# Patient Record
Sex: Female | Born: 1990 | Hispanic: No | Marital: Married | Smoking: Never smoker
Health system: Southern US, Community
[De-identification: ages and names within clinical notes are randomized; demographics above are authoritative.]

## PROBLEM LIST (undated history)

## (undated) DIAGNOSIS — D649 Anemia, unspecified: Secondary | ICD-10-CM

## (undated) DIAGNOSIS — O24419 Gestational diabetes mellitus in pregnancy, unspecified control: Secondary | ICD-10-CM

## (undated) HISTORY — DX: Gestational diabetes mellitus in pregnancy, unspecified control: O24.419

## (undated) HISTORY — PX: NO PAST SURGERIES: SHX2092

---

## 2015-10-08 NOTE — L&D Delivery Note (Signed)
25 y.o. G1P0 at 2672w2d delivered a viable female infant in cephalic, LOA position. No nuchal cord. Right anterior shoulder delivered with ease. 60 sec delayed cord clamping. Cord clamped x2 and cut. Placenta delivered spontaneously intact, with 3VC. Fundus firm on exam with massage and pitocin. Good hemostasis noted.  Laceration: 2nd degree perineal with extension into right labia Suture: 3-0 Vicryl Good hemostasis noted.  Mom and baby recovering in LDR.    Apgars: 8/9 Weight: pending, skin to skin EBL: 250 cc    Anne MowElizabeth Rease Wence, DO OB Fellow [facility] 07/12/2016, 9:29 PM

## 2016-04-12 ENCOUNTER — Encounter: Payer: Self-pay | Admitting: Obstetrics and Gynecology

## 2016-04-12 ENCOUNTER — Other Ambulatory Visit: Payer: Medicaid Other

## 2016-04-12 ENCOUNTER — Other Ambulatory Visit: Payer: Self-pay | Admitting: Obstetrics and Gynecology

## 2016-04-12 ENCOUNTER — Ambulatory Visit: Payer: Medicaid Other | Admitting: Obstetrics and Gynecology

## 2016-04-12 VITALS — BP 101/70 | Wt 128.0 lb

## 2016-04-12 DIAGNOSIS — N912 Amenorrhea, unspecified: Secondary | ICD-10-CM

## 2016-04-12 MED ORDER — PRENATAL VITAMINS 0.8 MG PO TABS
1.0000 | ORAL_TABLET | Freq: Every day | ORAL | Status: AC
Start: 2016-04-12 — End: ?

## 2016-04-12 NOTE — Progress Notes (Signed)
Subjective:       Patient ID: Vanessa Maxwell is a 25 y.o. female.Present with amenorrhea 26 weeks by LMP    HPI    The following portions of the patient's history were reviewed and updated as appropriate: allergies, current medications, past family history, past social history, past surgical history and problem list.    Review of Systems   Constitutional: Negative.    HENT: Negative.    Eyes: Negative.    Cardiovascular: Negative.    Endocrine: Negative.    Genitourinary: Negative.    Musculoskeletal: Negative.    Skin: Negative.    Allergic/Immunologic: Negative.    Neurological: Negative.            Objective:    Physical Exam   Constitutional: She is oriented to person, place, and time. She appears well-developed and well-nourished.   HENT:   Head: Normocephalic and atraumatic.   Eyes: Conjunctivae and EOM are normal. Pupils are equal, round, and reactive to light.   Neck: Neck supple. No thyromegaly present.   Cardiovascular: Normal rate and regular rhythm.    Pulmonary/Chest: Effort normal and breath sounds normal.   Abdominal: Soft. Bowel sounds are normal. She exhibits no distension.   Genitourinary: Vagina normal and uterus normal.   Musculoskeletal: Normal range of motion.   Neurological: She is alert and oriented to person, place, and time.   Skin: Skin is warm.           Assessment:     Ultrasound ordered   Awaiting address          Plan:      Procedures    Ultrasound results showed   AUA [redacted]W[redacted]D  EDD1 0/1/17  EFW 2LB 8ZO 75%Tile  Vertex Presentation    Anterior placenta grade 0  CX 4.1CM  Baby Girl  Anatomical survey next week  F/U in one week

## 2016-04-16 ENCOUNTER — Other Ambulatory Visit: Payer: Medicaid Other

## 2016-04-16 ENCOUNTER — Ambulatory Visit: Payer: Medicaid Other | Admitting: Obstetrics and Gynecology

## 2016-04-17 LAB — CHLAMYDIA TRACHOMATIS AND NEISSERIA GONORRHOEAE, RNA, TMA
C.trachomatis RNA,TMA: NOT DETECTED
N. gonorrhoeae RNA, TMA: NOT DETECTED

## 2016-04-18 LAB — CBC WITH DIFF AND PLT, RPR, ABO & RH,  ANTIBODY SCREEN,  RUBELLA AND HEPATITIS B SURFACE ANTIGEN
AB Screen: NOT DETECTED
Atypical Lymphocytes %: 0 %
Baso(Absolute): 15 cells/uL (ref 0–200)
Basophils: 0.2 %
Eosinophils Absolute: 38 cells/uL (ref 15–500)
Eosinophils: 0.5 %
Hematocrit: 36.8 % (ref 35.0–45.0)
Hemoglobin: 11.8 g/dL (ref 11.7–15.5)
Hepatitis B Surface Antigen: NONREACTIVE
Lymphocytes Absolute: 1103 cells/uL (ref 850–3900)
Lymphocytes: 14.7 %
MCH: 30.4 pg (ref 27–33)
MCHC: 32 g/dL (ref 32–36)
MCV: 95 fL (ref 80–100)
MPV: 12 fL (ref 7.5–12.5)
Monocytes Absolute: 390 cells/uL (ref 200–950)
Monocytes: 5.2 %
Neutrophils Absolute: 5955 cells/uL (ref 1500–7800)
Neutrophils: 79.4 %
Platelets: 153 10*3/uL (ref 140–400)
RBC: 3.88 10*6/uL (ref 3.80–5.10)
RDW: 14.4 % (ref 11.0–15.0)
RPR: NONREACTIVE
Rubella AB, IgG: 17.5 Index
WBC: 7.5 10*3/uL (ref 3.8–10.8)

## 2016-04-18 LAB — TSH: TSH: 2.02 mIU/L (ref 0.40–4.50)

## 2016-04-18 LAB — THINPREP IMAGING PAP WITH REFLEX TO HPV MRNA E6/E7.

## 2016-04-18 LAB — HIV-1/2 AG/AB 4TH GEN. W/ REFLEX: HIV Ag/Ab, 4th Generation: NONREACTIVE

## 2016-04-25 ENCOUNTER — Encounter: Payer: Medicaid Other | Admitting: Obstetrics and Gynecology

## 2016-04-29 ENCOUNTER — Encounter: Payer: Self-pay | Admitting: Obstetrics and Gynecology

## 2016-04-29 ENCOUNTER — Ambulatory Visit: Payer: Medicaid Other | Admitting: Obstetrics and Gynecology

## 2016-04-29 DIAGNOSIS — R109 Unspecified abdominal pain: Secondary | ICD-10-CM

## 2016-04-29 LAB — CYTOLOGY - PAP: Pap: NEGATIVE

## 2016-04-29 NOTE — Progress Notes (Signed)
IUP at [redacted]w[redacted]d. Size = Dates.  No complications. Patient states she has acid reflux. Sugar test completed. Results to follow. F/U in 1 weeks.

## 2016-04-30 LAB — CBC AND DIFFERENTIAL
Atypical Lymphocytes %: 0 %
Baso(Absolute): 20 cells/uL (ref 0–200)
Basophils: 0.2 %
Eosinophils Absolute: 91 cells/uL (ref 15–500)
Eosinophils: 0.9 %
Hematocrit: 33.3 % — ABNORMAL LOW (ref 35.0–45.0)
Hemoglobin: 10.6 g/dL — ABNORMAL LOW (ref 11.7–15.5)
Lymphocytes Absolute: 1313 cells/uL (ref 850–3900)
Lymphocytes: 13 %
MCH: 30.2 pg (ref 27–33)
MCHC: 31.7 g/dL — ABNORMAL LOW (ref 32–36)
MCV: 95 fL (ref 80–100)
MPV: 10.8 fL (ref 7.5–12.5)
Monocytes Absolute: 556 cells/uL (ref 200–950)
Monocytes: 5.5 %
Neutrophils Absolute: 8120 cells/uL — ABNORMAL HIGH (ref 1500–7800)
Neutrophils: 80.4 %
Platelets: 149 10*3/uL (ref 140–400)
RBC: 3.5 10*6/uL — ABNORMAL LOW (ref 3.80–5.10)
RDW: 13.5 % (ref 11.0–15.0)
WBC: 10.1 10*3/uL (ref 3.8–10.8)

## 2016-04-30 LAB — GLUCOSE GESTATIONAL SCREEN,  1 HOUR ONLY, (50G) 140 CUTF: GLUCOSE,GEST SCREEN-140 CUTOFF: 98 mg/dL (ref <140)

## 2016-05-03 ENCOUNTER — Telehealth: Payer: Self-pay

## 2016-05-17 ENCOUNTER — Other Ambulatory Visit: Payer: Medicaid Other

## 2016-05-17 ENCOUNTER — Ambulatory Visit: Payer: Medicaid Other | Admitting: Obstetrics and Gynecology

## 2016-05-17 VITALS — BP 104/60 | Wt 130.0 lb

## 2016-05-17 DIAGNOSIS — Z3A32 32 weeks gestation of pregnancy: Secondary | ICD-10-CM

## 2016-05-17 NOTE — Progress Notes (Incomplete)
IUP at [redacted]w[redacted]d. Size >> Dates. No complications. Patient states she has acid reflux on last visit. Sugar test completed on last visit as well.  Patient c/o some headaches.  Education given regarding headaches in pregnancy.  Instructions given if headaches get worst or increase in intensity to f/u.  USG done.  Results on chart. F/U in 1 week.

## 2016-05-31 ENCOUNTER — Other Ambulatory Visit: Payer: Medicaid Other

## 2016-05-31 ENCOUNTER — Ambulatory Visit: Payer: Medicaid Other | Admitting: Obstetrics and Gynecology

## 2016-05-31 VITALS — BP 115/78 | Wt 135.0 lb

## 2016-05-31 DIAGNOSIS — Z3A34 34 weeks gestation of pregnancy: Secondary | ICD-10-CM | POA: Insufficient documentation

## 2016-05-31 NOTE — Progress Notes (Signed)
IUP at [redacted]w[redacted]d. Size < Dates. No complications. Sugar test completed on last visit as well.  USG done. Results on chart. F/U in 1 week.   GBS done   Patient [place] another [place] requesting medical records.  Will work closely with patient is Arts development officer transfer of notes.

## 2016-06-03 LAB — GROUP B STREP CULTURE

## 2016-06-04 LAB — CBC AND DIFFERENTIAL
Atypical Lymphocytes %: 0 %
Baso(Absolute): 19 cells/uL (ref 0–200)
Basophils: 0.2 %
Eosinophils Absolute: 58 cells/uL (ref 15–500)
Eosinophils: 0.6 %
Hematocrit: 33.3 % — ABNORMAL LOW (ref 35.0–45.0)
Hemoglobin: 10.9 g/dL — ABNORMAL LOW (ref 11.7–15.5)
Lymphocytes Absolute: 1344 cells/uL (ref 850–3900)
Lymphocytes: 14 %
MCH: 31 pg (ref 27–33)
MCHC: 32.8 g/dL (ref 32–36)
MCV: 95 fL (ref 80–100)
MPV: 10.8 fL (ref 7.5–12.5)
Monocytes Absolute: 595 cells/uL (ref 200–950)
Monocytes: 6.2 %
Neutrophils Absolute: 7584 cells/uL (ref 1500–7800)
Neutrophils: 79 %
Platelets: 123 10*3/uL — ABNORMAL LOW (ref 140–400)
RBC: 3.51 10*6/uL — ABNORMAL LOW (ref 3.80–5.10)
RDW: 14.9 % (ref 11.0–15.0)
WBC: 9.6 10*3/uL (ref 3.8–10.8)

## 2016-06-04 LAB — RPR: RPR: NONREACTIVE

## 2016-07-06 ENCOUNTER — Inpatient Hospital Stay (HOSPITAL_COMMUNITY)
Admission: AD | Admit: 2016-07-06 | Discharge: 2016-07-06 | Disposition: A | Discharge status: Home / Self Care | Payer: Medicaid Other | Source: Ambulatory Visit | Attending: Family Medicine | Admitting: Family Medicine

## 2016-07-06 ENCOUNTER — Encounter (HOSPITAL_COMMUNITY): Payer: Self-pay | Admitting: *Deleted

## 2016-07-06 DIAGNOSIS — Z3403 Encounter for supervision of normal first pregnancy, third trimester: Secondary | ICD-10-CM | POA: Insufficient documentation

## 2016-07-06 DIAGNOSIS — O0930 Supervision of pregnancy with insufficient antenatal care, unspecified trimester: Secondary | ICD-10-CM

## 2016-07-06 HISTORY — DX: Anemia, unspecified: D64.9

## 2016-07-06 LAB — CBC
HCT: 38 % (ref 36.0–46.0)
Hemoglobin: 12.6 g/dL (ref 12.0–15.0)
MCH: 31.7 pg (ref 26.0–34.0)
MCHC: 33.2 g/dL (ref 30.0–36.0)
MCV: 95.5 fL (ref 78.0–100.0)
PLATELETS: 121 10*3/uL — AB (ref 150–400)
RBC: 3.98 MIL/uL (ref 3.87–5.11)
RDW: 13.9 % (ref 11.5–15.5)
WBC: 9.1 10*3/uL (ref 4.0–10.5)

## 2016-07-06 LAB — DIFFERENTIAL
BASOS ABS: 0 10*3/uL (ref 0.0–0.1)
BASOS PCT: 0 %
Eosinophils Absolute: 0 10*3/uL (ref 0.0–0.7)
Eosinophils Relative: 0 %
LYMPHS PCT: 20 %
Lymphs Abs: 1.8 10*3/uL (ref 0.7–4.0)
Monocytes Absolute: 0.4 10*3/uL (ref 0.1–1.0)
Monocytes Relative: 4 %
NEUTROS ABS: 6.8 10*3/uL (ref 1.7–7.7)
NEUTROS PCT: 75 %

## 2016-07-06 LAB — OB RESULTS CONSOLE GBS: STREP GROUP B AG: NEGATIVE

## 2016-07-06 LAB — GROUP B STREP BY PCR: GROUP B STREP BY PCR: NEGATIVE

## 2016-07-06 NOTE — Discharge Instructions (Signed)
Fetal Movement Counts °Patient Name: __________________________________________________ Patient Due Date: ____________________ °Performing a fetal movement count is highly recommended in high-risk pregnancies, but it is good for every pregnant woman to do. Your health care provider may ask you to start counting fetal movements at 28 weeks of the pregnancy. Fetal movements often increase: °· After eating a full meal. °· After physical activity. °· After eating or drinking something sweet or cold. °· At rest. °Pay attention to when you feel the baby is most active. This will help you notice a pattern of your baby's sleep and wake cycles and what factors contribute to an increase in fetal movement. It is important to perform a fetal movement count at the same time each day when your baby is normally most active.  °HOW TO COUNT FETAL MOVEMENTS °· Find a quiet and comfortable area to sit or lie down on your left side. Lying on your left side provides the best blood and oxygen circulation to your baby. °· Write down the day and time on a sheet of paper or in a journal. °· Start counting kicks, flutters, swishes, rolls, or jabs in a 2-hour period. You should feel at least 10 movements within 2 hours. °· If you do not feel 10 movements in 2 hours, wait 2-3 hours and count again. Look for a change in the pattern or not enough counts in 2 hours. °SEEK MEDICAL CARE IF: °· You feel less than 10 counts in 2 hours, tried twice. °· There is no movement in over an hour. °· The pattern is changing or taking longer each day to reach 10 counts in 2 hours. °· You feel the baby is not moving as he or she usually does. °Date: ____________ Movements: ____________ Start time: ____________ Finish time: ____________  °Date: ____________ Movements: ____________ Start time: ____________ Finish time: ____________ °Date: ____________ Movements: ____________ Start time: ____________ Finish time: ____________ °Date: ____________ Movements:  ____________ Start time: ____________ Finish time: ____________ °Date: ____________ Movements: ____________ Start time: ____________ Finish time: ____________ °Date: ____________ Movements: ____________ Start time: ____________ Finish time: ____________ °Date: ____________ Movements: ____________ Start time: ____________ Finish time: ____________ °Date: ____________ Movements: ____________ Start time: ____________ Finish time: ____________  °Date: ____________ Movements: ____________ Start time: ____________ Finish time: ____________ °Date: ____________ Movements: ____________ Start time: ____________ Finish time: ____________ °Date: ____________ Movements: ____________ Start time: ____________ Finish time: ____________ °Date: ____________ Movements: ____________ Start time: ____________ Finish time: ____________ °Date: ____________ Movements: ____________ Start time: ____________ Finish time: ____________ °Date: ____________ Movements: ____________ Start time: ____________ Finish time: ____________ °Date: ____________ Movements: ____________ Start time: ____________ Finish time: ____________  °Date: ____________ Movements: ____________ Start time: ____________ Finish time: ____________ °Date: ____________ Movements: ____________ Start time: ____________ Finish time: ____________ °Date: ____________ Movements: ____________ Start time: ____________ Finish time: ____________ °Date: ____________ Movements: ____________ Start time: ____________ Finish time: ____________ °Date: ____________ Movements: ____________ Start time: ____________ Finish time: ____________ °Date: ____________ Movements: ____________ Start time: ____________ Finish time: ____________ °Date: ____________ Movements: ____________ Start time: ____________ Finish time: ____________  °Date: ____________ Movements: ____________ Start time: ____________ Finish time: ____________ °Date: ____________ Movements: ____________ Start time: ____________ Finish  time: ____________ °Date: ____________ Movements: ____________ Start time: ____________ Finish time: ____________ °Date: ____________ Movements: ____________ Start time: ____________ Finish time: ____________ °Date: ____________ Movements: ____________ Start time: ____________ Finish time: ____________ °Date: ____________ Movements: ____________ Start time: ____________ Finish time: ____________ °Date: ____________ Movements: ____________ Start time: ____________ Finish time: ____________  °Date: ____________ Movements: ____________ Start time: ____________ Finish   time: ____________ °Date: ____________ Movements: ____________ Start time: ____________ Finish time: ____________ °Date: ____________ Movements: ____________ Start time: ____________ Finish time: ____________ °Date: ____________ Movements: ____________ Start time: ____________ Finish time: ____________ °Date: ____________ Movements: ____________ Start time: ____________ Finish time: ____________ °Date: ____________ Movements: ____________ Start time: ____________ Finish time: ____________ °Date: ____________ Movements: ____________ Start time: ____________ Finish time: ____________  °Date: ____________ Movements: ____________ Start time: ____________ Finish time: ____________ °Date: ____________ Movements: ____________ Start time: ____________ Finish time: ____________ °Date: ____________ Movements: ____________ Start time: ____________ Finish time: ____________ °Date: ____________ Movements: ____________ Start time: ____________ Finish time: ____________ °Date: ____________ Movements: ____________ Start time: ____________ Finish time: ____________ °Date: ____________ Movements: ____________ Start time: ____________ Finish time: ____________ °Date: ____________ Movements: ____________ Start time: ____________ Finish time: ____________  °Date: ____________ Movements: ____________ Start time: ____________ Finish time: ____________ °Date: ____________  Movements: ____________ Start time: ____________ Finish time: ____________ °Date: ____________ Movements: ____________ Start time: ____________ Finish time: ____________ °Date: ____________ Movements: ____________ Start time: ____________ Finish time: ____________ °Date: ____________ Movements: ____________ Start time: ____________ Finish time: ____________ °Date: ____________ Movements: ____________ Start time: ____________ Finish time: ____________ °Date: ____________ Movements: ____________ Start time: ____________ Finish time: ____________  °Date: ____________ Movements: ____________ Start time: ____________ Finish time: ____________ °Date: ____________ Movements: ____________ Start time: ____________ Finish time: ____________ °Date: ____________ Movements: ____________ Start time: ____________ Finish time: ____________ °Date: ____________ Movements: ____________ Start time: ____________ Finish time: ____________ °Date: ____________ Movements: ____________ Start time: ____________ Finish time: ____________ °Date: ____________ Movements: ____________ Start time: ____________ Finish time: ____________ °  °This information is not intended to replace advice given to you by your health care provider. Make sure you discuss any questions you have with your health care provider. °  °Document Released: 10/23/2006 Document Revised: 10/14/2014 Document Reviewed: 07/20/2012 °Elsevier Interactive Patient Education ©2016 [employer] °Braxton Hicks Contractions °Contractions of the uterus can occur throughout pregnancy. Contractions are not always a sign that you are in labor.  °WHAT ARE BRAXTON HICKS CONTRACTIONS?  °Contractions that occur before labor are called Braxton Hicks contractions, or false labor. Toward the end of pregnancy (32-34 weeks), these contractions can develop more often and may become more forceful. This is not true labor because these contractions do not result in opening (dilatation) and thinning of  the cervix. They are sometimes difficult to tell apart from true labor because these contractions can be forceful and people have different pain tolerances. You should not feel embarrassed if you go to the hospital with false labor. Sometimes, the only way to tell if you are in true labor is for your health care provider to look for changes in the cervix. °If there are no prenatal problems or other health problems associated with the pregnancy, it is completely safe to be sent home with false labor and await the onset of true labor. °HOW CAN YOU TELL THE DIFFERENCE BETWEEN TRUE AND FALSE LABOR? °False Labor °· The contractions of false labor are usually shorter and not as hard as those of true labor.   °· The contractions are usually irregular.   °· The contractions are often felt in the front of the lower abdomen and in the groin.   °· The contractions may go away when you walk around or change positions while lying down.   °· The contractions get weaker and are shorter lasting as time goes on.   °· The contractions do not usually become progressively stronger, regular, and closer together as with true labor.   °True Labor °· Contractions in true   labor last 30-70 seconds, become very regular, usually become more intense, and increase in frequency.   °· The contractions do not go away with walking.   °· The discomfort is usually felt in the top of the uterus and spreads to the lower abdomen and low back.   °· True labor can be determined by your health care provider with an exam. This will show that the cervix is dilating and getting thinner.   °WHAT TO REMEMBER °· Keep up with your usual exercises and follow other instructions given by your health care provider.   °· Take medicines as directed by your health care provider.   °· Keep your regular prenatal appointments.   °· Eat and drink lightly if you think you are going into labor.   °· If Braxton Hicks contractions are making you uncomfortable:   °¨ Change your  position from lying down or resting to walking, or from walking to resting.   °¨ Sit and rest in a tub of warm water.   °¨ Drink 2-3 glasses of water. Dehydration may cause these contractions.   °¨ Do slow and deep breathing several times an hour.   °WHEN SHOULD I SEEK IMMEDIATE MEDICAL CARE? °Seek immediate medical care if: °· Your contractions become stronger, more regular, and closer together.   °· You have fluid leaking or gushing from your vagina.   °· You have a fever.   °· You pass blood-tinged mucus.   °· You have vaginal bleeding.   °· You have continuous abdominal pain.   °· You have low back pain that you never had before.   °· You feel your baby's head pushing down and causing pelvic pressure.   °· Your baby is not moving as much as it used to.   °  °This information is not intended to replace advice given to you by your health care provider. Make sure you discuss any questions you have with your health care provider. °  °Document Released: 09/23/2005 Document Revised: 09/28/2013 Document Reviewed: 07/05/2013 °Elsevier Interactive Patient Education ©2016 Elsevier Inc. ° °

## 2016-07-06 NOTE — MAU Note (Addendum)
Pt here for small amount of brown discharge and some contractions that started at 5pm. +FM. Denies leaking of fluid. [place] Has not seen Dr since th

## 2016-07-07 LAB — HEPATITIS B SURFACE ANTIGEN: HEP B S AG: NEGATIVE

## 2016-07-07 LAB — RPR: RPR: NONREACTIVE

## 2016-07-07 LAB — ABO/RH: ABO/RH(D): B POS

## 2016-07-07 LAB — HIV ANTIBODY (ROUTINE TESTING W REFLEX): HIV Screen 4th Generation wRfx: NONREACTIVE

## 2016-07-08 ENCOUNTER — Encounter (HOSPITAL_COMMUNITY): Payer: Self-pay | Admitting: *Deleted

## 2016-07-08 ENCOUNTER — Inpatient Hospital Stay (HOSPITAL_COMMUNITY)
Admission: AD | Admit: 2016-07-08 | Discharge: 2016-07-08 | Disposition: A | Discharge status: Home / Self Care | Payer: Medicaid Other | Source: Ambulatory Visit | Attending: Obstetrics and Gynecology | Admitting: Obstetrics and Gynecology

## 2016-07-08 DIAGNOSIS — Z3A38 38 weeks gestation of pregnancy: Secondary | ICD-10-CM | POA: Diagnosis not present

## 2016-07-08 DIAGNOSIS — M79659 Pain in unspecified thigh: Secondary | ICD-10-CM | POA: Insufficient documentation

## 2016-07-08 DIAGNOSIS — O26893 Other specified pregnancy related conditions, third trimester: Secondary | ICD-10-CM | POA: Diagnosis not present

## 2016-07-08 DIAGNOSIS — R1030 Lower abdominal pain, unspecified: Secondary | ICD-10-CM | POA: Diagnosis not present

## 2016-07-08 DIAGNOSIS — M79606 Pain in leg, unspecified: Secondary | ICD-10-CM | POA: Diagnosis present

## 2016-07-08 DIAGNOSIS — R102 Pelvic and perineal pain: Secondary | ICD-10-CM

## 2016-07-08 LAB — URINALYSIS, ROUTINE W REFLEX MICROSCOPIC
Bilirubin Urine: NEGATIVE
GLUCOSE, UA: NEGATIVE mg/dL
HGB URINE DIPSTICK: NEGATIVE
Ketones, ur: NEGATIVE mg/dL
Leukocytes, UA: NEGATIVE
Nitrite: NEGATIVE
PH: 6 (ref 5.0–8.0)
PROTEIN: NEGATIVE mg/dL
Specific Gravity, Urine: <1.005 — ABNORMAL LOW (ref 1.005–1.030)

## 2016-07-08 LAB — RUBELLA SCREEN: RUBELLA: 15.1 {index} (ref >0.99)

## 2016-07-08 MED ORDER — CYCLOBENZAPRINE HCL 5 MG PO TABS: 5.0000 mg | ORAL_TABLET | Freq: Three times a day (TID) | ORAL | 0 refills | Status: DC | PRN

## 2016-07-08 NOTE — MAU Provider Note (Signed)
  History     CSN: [id number]  Arrival date and time: 07/08/16 1905   None     No chief complaint on file.  HPI  [name] is a 25 year old G1P0 at 311w5d who presents to the MAU with leg pain that started this morning. The pain is located in her groins and upper legs. She states she can't walk normally. The pain is worse with walking and closing her legs. The pain is better with laying down with her legs spread. She has not had pain in her legs before, but has had lower abdominal pain earlier in this pregnancy. The pain feels like "grabbing" pain. She started having irregular contractions a few weeks ago. The contractions are occurring every couple of hours. She denies any vaginal bleeding. She endorses white, thick, "mucousy" discharge. She denies dysuria. No dizziness.   Past Medical History:  Diagnosis Date  . Anemia     Past Surgical History:  Procedure Laterality Date  . NO PAST SURGERIES      History reviewed. No pertinent family history.  Social History  Substance Use Topics  . Smoking status: Never Smoker  . Smokeless tobacco: Never Used  . Alcohol use No    Allergies: No Known Allergies  Prescriptions Prior to Admission  Medication Sig Dispense Refill Last Dose  . prenatal vitamin w/FE, FA (PRENATAL 1 + 1) 27-1 MG TABS tablet Take 1 tablet by mouth daily at 12 noon.   07/08/2016 at Unknown time    Review of Systems  Eyes: Negative for blurred vision.  Respiratory: Negative for shortness of breath.   Cardiovascular: Negative for chest pain.  Gastrointestinal: Positive for abdominal pain. Negative for heartburn, nausea and vomiting.  Genitourinary: Negative for dysuria.  Neurological: Positive for headaches. Negative for dizziness and weakness.   Physical Exam   Blood pressure 120/81, pulse 91, temperature 98.5 F (36.9 C), temperature source Oral, resp. rate 16, height 5\' 3"  (1.6 m), weight 61.2 kg (135 lb), SpO2 100 %.  Physical Exam  Nursing note and vitals  reviewed. Constitutional: She is oriented to person, place, and time. She appears well-developed and well-nourished. No distress.  Eyes: No scleral icterus.  Neck: Normal range of motion.  Cardiovascular: Normal rate.   Respiratory: Effort normal.  GI: Soft. There is no tenderness.  gravid  Musculoskeletal: Normal range of motion. She exhibits no edema or deformity.  Neurological: She is alert and oriented to person, place, and time.  Skin: Skin is warm and dry. No rash noted.  Brown dye present on fingertips and toes    MAU Course  Procedures  Fetal heart monitoring- normal  Assessment and Plan  Pt is a 25 year old G1P0 at 301w5d presenting with lower abdominal pain and groin/thigh pain. She has been having to walk with her legs slightly spread. We watched her ambulate in the halls of the MAU and she was smiling while walking. Likely muscular/ligamentous pain of advanced pregnancy. - Advised Pt that she can use Tylenol around the clock - Flexeril 5mg  tid prn prescribed - We are trying to get her into [facility] for routine prenatal care. She has not received regular care this pregnancy. - She will follow-up in our clinic when we can get her scheduled  Hilton SinclairKaty D Mayo 07/08/2016, 8:43 PM   OB FELLOW DISCHARGE ATTESTATION  I have seen and examined this patient and agree with above documentation in the resident's note.   Ernestina PennaNicholas Dagoberto Nealy, MD 10:14 PM

## 2016-07-08 NOTE — Discharge Instructions (Signed)

## 2016-07-08 NOTE — MAU Note (Signed)
Pt reports pain in her pelvic/groin area, states she cannot walk. Denies bleeding or ROM.

## 2016-07-11 ENCOUNTER — Encounter (HOSPITAL_COMMUNITY): Payer: Self-pay | Admitting: *Deleted

## 2016-07-11 ENCOUNTER — Inpatient Hospital Stay (HOSPITAL_COMMUNITY): Payer: Medicaid Other

## 2016-07-11 ENCOUNTER — Inpatient Hospital Stay (HOSPITAL_COMMUNITY)
Admission: AD | Admit: 2016-07-11 | Discharge: 2016-07-14 | DRG: 774 | Disposition: A | Discharge status: Home / Self Care | Payer: Medicaid Other | Source: Ambulatory Visit | Attending: Obstetrics and Gynecology | Admitting: Obstetrics and Gynecology

## 2016-07-11 ENCOUNTER — Inpatient Hospital Stay (HOSPITAL_COMMUNITY): Payer: Medicaid Other | Admitting: Anesthesiology

## 2016-07-11 ENCOUNTER — Inpatient Hospital Stay (HOSPITAL_COMMUNITY)
Admission: AD | Admit: 2016-07-11 | Discharge: 2016-07-11 | Disposition: A | Discharge status: Home / Self Care | Payer: Medicaid Other | Source: Ambulatory Visit | Attending: Family Medicine | Admitting: Family Medicine

## 2016-07-11 DIAGNOSIS — N939 Abnormal uterine and vaginal bleeding, unspecified: Secondary | ICD-10-CM | POA: Diagnosis present

## 2016-07-11 DIAGNOSIS — O4593 Premature separation of placenta, unspecified, third trimester: Secondary | ICD-10-CM | POA: Diagnosis present

## 2016-07-11 DIAGNOSIS — Z3A39 39 weeks gestation of pregnancy: Secondary | ICD-10-CM | POA: Insufficient documentation

## 2016-07-11 DIAGNOSIS — R58 Hemorrhage, not elsewhere classified: Secondary | ICD-10-CM

## 2016-07-11 DIAGNOSIS — O4693 Antepartum hemorrhage, unspecified, third trimester: Secondary | ICD-10-CM | POA: Insufficient documentation

## 2016-07-11 DIAGNOSIS — Z3403 Encounter for supervision of normal first pregnancy, third trimester: Secondary | ICD-10-CM | POA: Diagnosis present

## 2016-07-11 LAB — CBC
HEMATOCRIT: 37.8 % (ref 36.0–46.0)
HEMOGLOBIN: 13 g/dL (ref 12.0–15.0)
MCH: 32.3 pg (ref 26.0–34.0)
MCHC: 34.4 g/dL (ref 30.0–36.0)
MCV: 93.8 fL (ref 78.0–100.0)
Platelets: 118 10*3/uL — ABNORMAL LOW (ref 150–400)
RBC: 4.03 MIL/uL (ref 3.87–5.11)
RDW: 13.8 % (ref 11.5–15.5)
WBC: 10.7 10*3/uL — AB (ref 4.0–10.5)

## 2016-07-11 LAB — TYPE AND SCREEN
ABO/RH(D): B POS
Antibody Screen: NEGATIVE

## 2016-07-11 IMAGING — US US MFM OB LIMITED
1 series · 15 of 28 positions shown · non-contrast
Comparison: none

[Series 1: us mfm ob limited · 36 acquisitions, 15 frames shown]
[im 1/36]
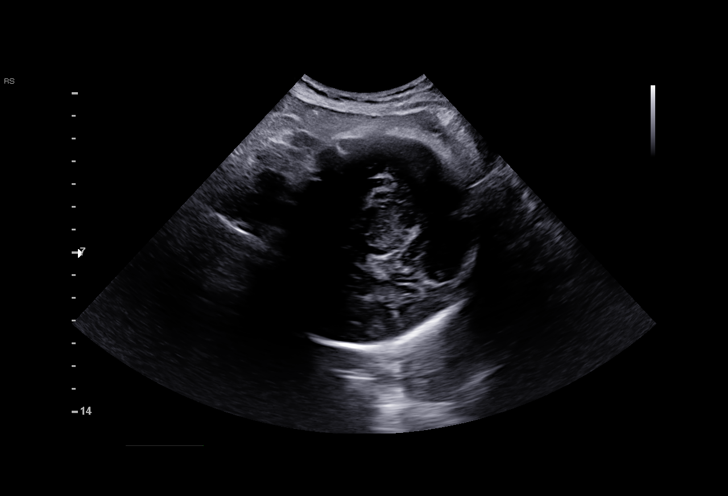
[im 3/36]
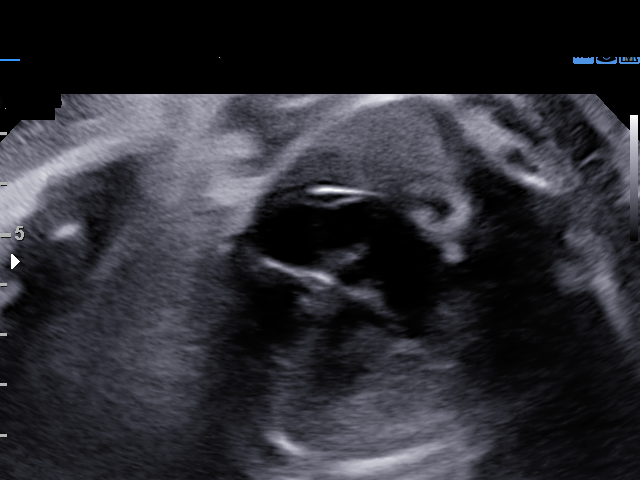
[im 6/36]
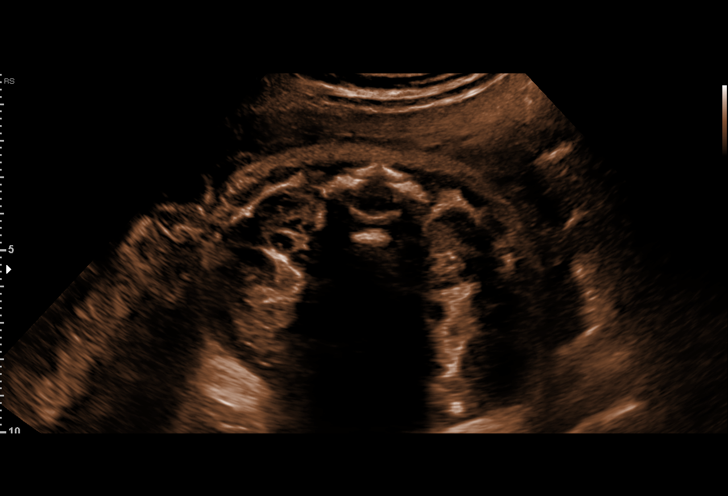
[im 8/36]
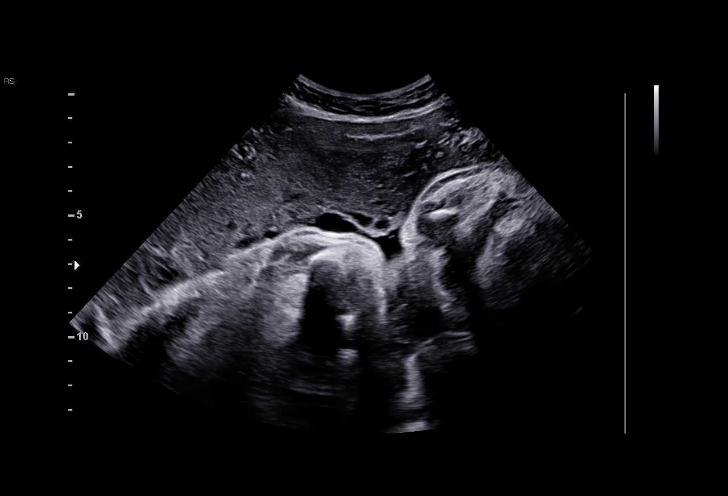
[im 11/36]
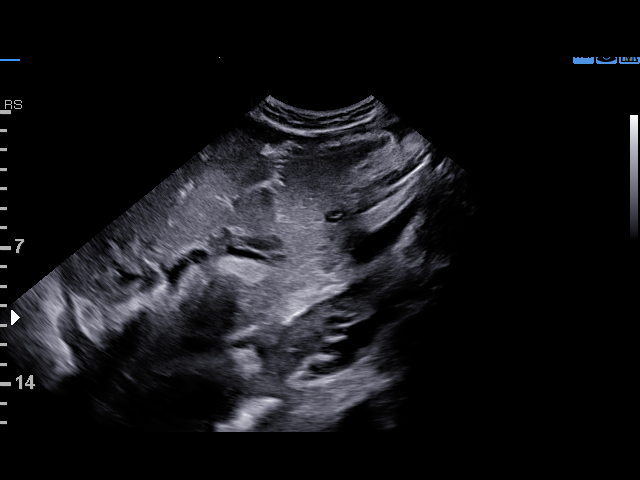
[im 13/36]
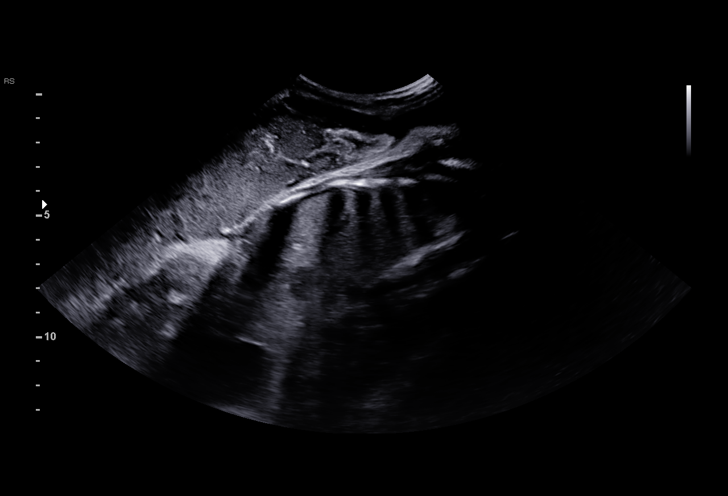
[im 16/36]
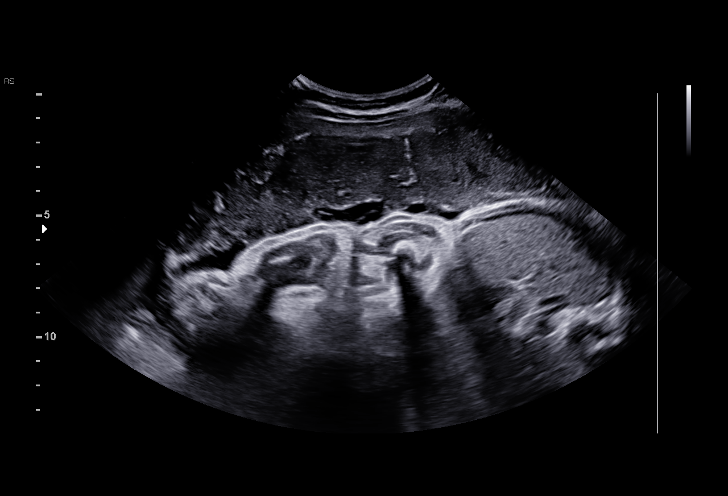
[im 19/36]
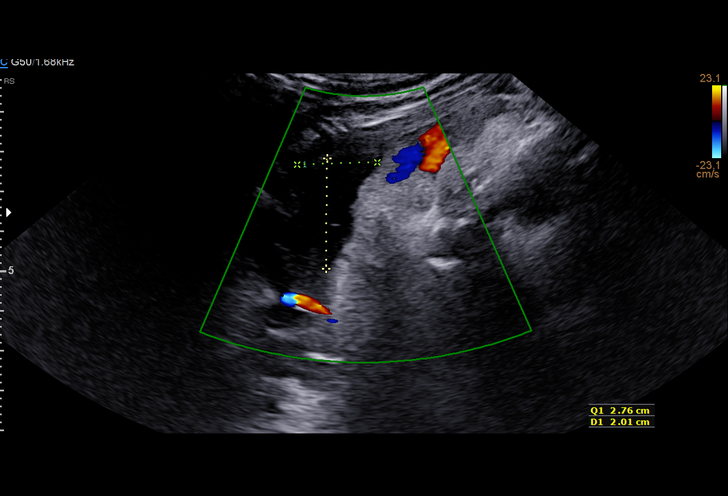
[im 20/36]
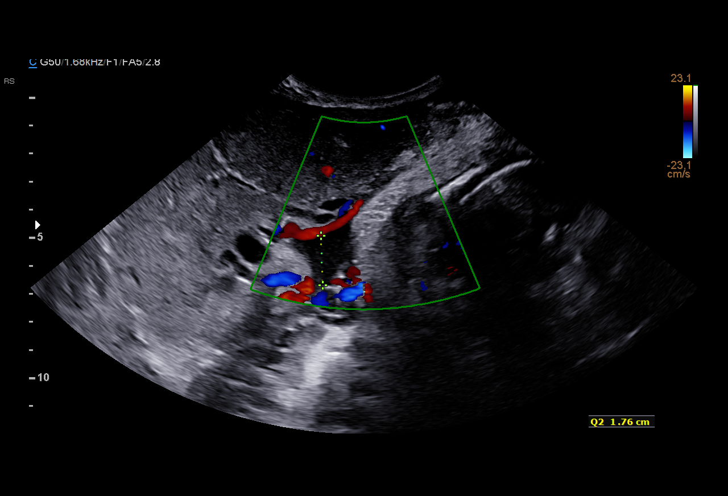
[im 23/36]
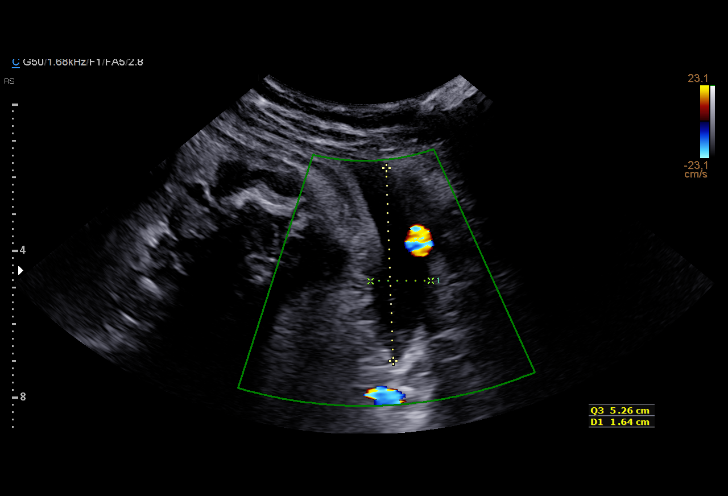
[im 25/36]
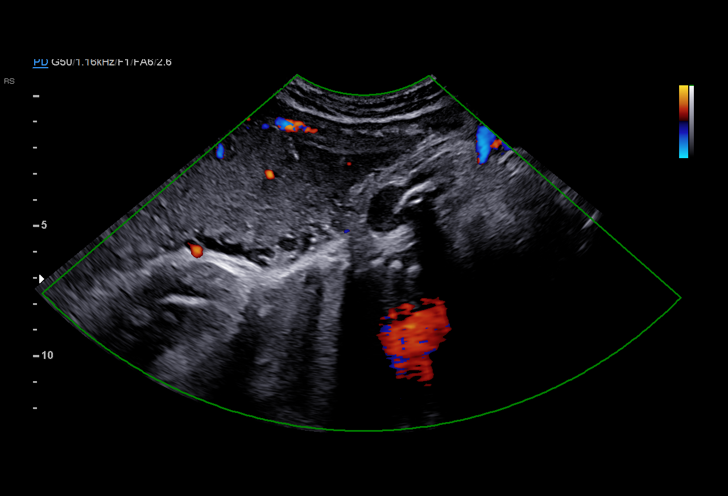
[im 28/36]
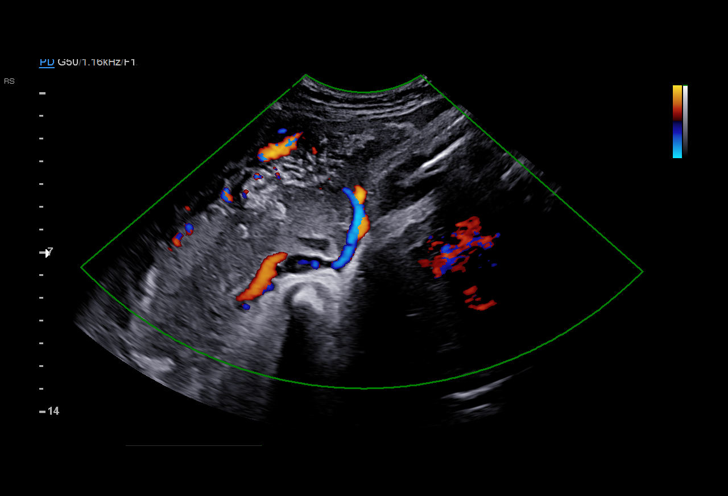
[im 30/36]
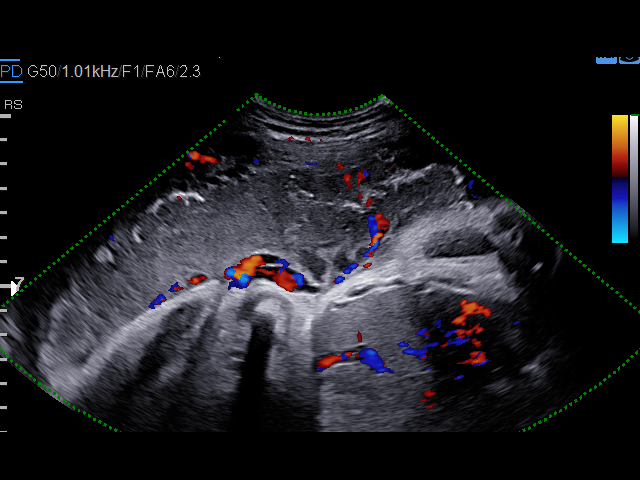
[im 33/36]
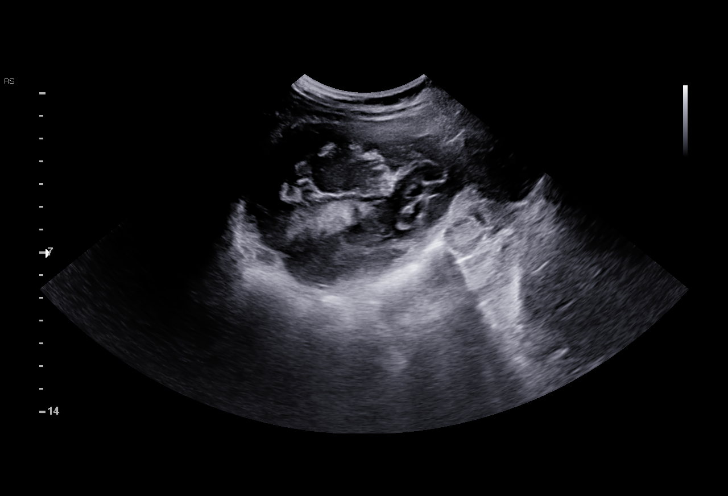
[im 36/36]
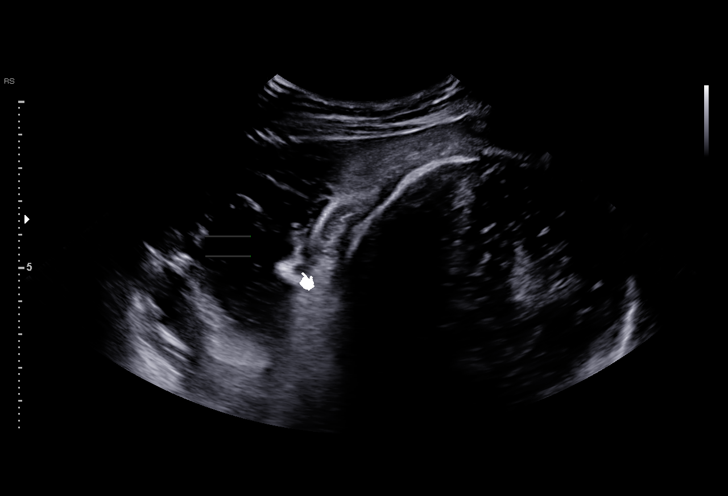

[15 of 28 positions shown; findings below may reference images not displayed]

Indications

39 weeks gestation of pregnancy
Vaginal bleeding in pregnancy, third trimester
Abdominal pain in pregnancy
OB History

Gravidity:    1
Fetal Evaluation

Num Of Fetuses:     1
Fetal Heart         132
Rate(bpm):
Cardiac Activity:   Observed
Presentation:       Cephalic
Placenta:           Anterior Fundal, above cervical os

Amniotic Fluid
AFI FV:      Subjectively within normal limits

AFI Sum(cm)     %Tile       Largest Pocket(cm)
9.78            26

RUQ(cm)                     LUQ(cm)        LLQ(cm)
2.76
Gestational Age

Clinical EDD:  39w 1d                                        EDD:   07/17/16
Best:          39w 1d     Det. By:  Clinical EDD             EDD:   07/17/16
Anatomy

Stomach:               Appears normal, left   Bladder:                Appears normal
sided
Kidneys:               Appear normal
Cervix Uterus Adnexa

Uterus
No abnormality visualized.

Left Ovary
Not visualized.

Right Ovary
Not visualized.

Adnexa:       No abnormality visualized. No abnormality
visualized.
Impression

IUP at 39+1 weeks with vaginal bleeding and abdominal pain
Normal amniotic fluid with an AFI of 9cm
No evidence of abruption/subchorionic hemorrhage or
retroplacental hemorrhage
Recommendations

US shows has no findings that would explain patient's
symptomatology.
Treat as clinically indicated

## 2016-07-11 MED ORDER — ONDANSETRON HCL 4 MG/2ML IJ SOLN: 4.0000 mg | Freq: Four times a day (QID) | INTRAMUSCULAR | Status: DC | PRN

## 2016-07-11 MED ORDER — EPHEDRINE 5 MG/ML INJ: 10.0000 mg | INTRAVENOUS | Status: DC | PRN

## 2016-07-11 MED ORDER — PHENYLEPHRINE 40 MCG/ML (10ML) SYRINGE FOR IV PUSH (FOR BLOOD PRESSURE SUPPORT)
80.0000 ug | PREFILLED_SYRINGE | INTRAVENOUS | Status: DC | PRN
  Filled 2016-07-11: qty 10

## 2016-07-11 MED ORDER — LACTATED RINGERS IV SOLN
500.0000 mL | INTRAVENOUS | Status: DC | PRN
  Administered 2016-07-12: 250 mL via INTRAVENOUS

## 2016-07-11 MED ORDER — ACETAMINOPHEN 325 MG PO TABS: 650.0000 mg | ORAL_TABLET | ORAL | Status: DC | PRN

## 2016-07-11 MED ORDER — LACTATED RINGERS IV SOLN
INTRAVENOUS | Status: DC
  Administered 2016-07-11 – 2016-07-12 (×4): via INTRAVENOUS

## 2016-07-11 MED ORDER — LIDOCAINE HCL (PF) 1 % IJ SOLN
30.0000 mL | INTRAMUSCULAR | Status: AC | PRN
  Administered 2016-07-12: 30 mL via SUBCUTANEOUS
  Filled 2016-07-11: qty 30

## 2016-07-11 MED ORDER — OXYCODONE-ACETAMINOPHEN 5-325 MG PO TABS: 2.0000 | ORAL_TABLET | ORAL | Status: DC | PRN

## 2016-07-11 MED ORDER — OXYTOCIN 40 UNITS IN LACTATED RINGERS INFUSION - SIMPLE MED: 2.5000 [IU]/h | INTRAVENOUS | Status: DC

## 2016-07-11 MED ORDER — LACTATED RINGERS IV SOLN: 500.0000 mL | Freq: Once | INTRAVENOUS | Status: DC

## 2016-07-11 MED ORDER — FENTANYL 2.5 MCG/ML BUPIVACAINE 1/10 % EPIDURAL INFUSION (WH - ANES)
14.0000 mL/h | INTRAMUSCULAR | Status: DC | PRN
  Administered 2016-07-12 (×3): 14 mL/h via EPIDURAL
  Filled 2016-07-11 (×3): qty 125

## 2016-07-11 MED ORDER — FENTANYL CITRATE (PF) 100 MCG/2ML IJ SOLN: 50.0000 ug | INTRAMUSCULAR | Status: DC | PRN

## 2016-07-11 MED ORDER — DIPHENHYDRAMINE HCL 50 MG/ML IJ SOLN: 12.5000 mg | INTRAMUSCULAR | Status: DC | PRN

## 2016-07-11 MED ORDER — FLEET ENEMA 7-19 GM/118ML RE ENEM: 1.0000 | ENEMA | RECTAL | Status: DC | PRN

## 2016-07-11 MED ORDER — PHENYLEPHRINE 40 MCG/ML (10ML) SYRINGE FOR IV PUSH (FOR BLOOD PRESSURE SUPPORT): 80.0000 ug | PREFILLED_SYRINGE | INTRAVENOUS | Status: DC | PRN

## 2016-07-11 MED ORDER — SOD CITRATE-CITRIC ACID 500-334 MG/5ML PO SOLN: 30.0000 mL | ORAL | Status: DC | PRN

## 2016-07-11 MED ORDER — OXYTOCIN BOLUS FROM INFUSION
500.0000 mL | Freq: Once | INTRAVENOUS | Status: AC
  Administered 2016-07-12: 500 mL via INTRAVENOUS

## 2016-07-11 MED ORDER — OXYCODONE-ACETAMINOPHEN 5-325 MG PO TABS: 1.0000 | ORAL_TABLET | ORAL | Status: DC | PRN

## 2016-07-11 NOTE — MAU Note (Addendum)
Pt has been here two other time today.  States contractions started at 0700.  U/c's are now every 5 minutes. Bloody show. Pt pad is saturated with blood.  Reports fluid coming out.   Good fetal movement.

## 2016-07-11 NOTE — Anesthesia Preprocedure Evaluation (Signed)

## 2016-07-11 NOTE — H&P (Signed)
[name] is a 25 y.o. female G1P0 with IUP at 3047w1d presenting for contractions. She has been to the MAU 3 times today w/ctx and vaginal bleeding, but bleeding was mild and cx did not change.  [facility]  Past Medical History: Past Medical History:  Diagnosis Date  . Anemia     Past Surgical History: Past Surgical History:  Procedure Laterality Date  . NO PAST SURGERIES      Obstetrical History: OB History    Gravida Para Term Preterm AB Living   1         0   SAB TAB Ectopic Multiple Live Births                   Social History: Social History   Social History  . Marital status: Married    Spouse name: N/A  . Number of children: N/A  . Years of education: N/A   Social History Main Topics  . Smoking status: Never Smoker  . Smokeless tobacco: Never Used  . Alcohol use No  . Drug use: No  . Sexual activity: Yes    Birth control/ protection: None   Other Topics Concern  . None   Social History Narrative  . None    Family History: History reviewed. No pertinent family history.  Allergies: No Known Allergies  Prescriptions Prior to Admission  Medication Sig Dispense Refill Last Dose  . cyclobenzaprine (FLEXERIL) 5 MG tablet Take 1 tablet (5 mg total) by mouth 3 (three) times daily as needed for muscle spasms. 15 tablet 0 Past Week at Unknown time  . prenatal vitamin w/FE, FA (PRENATAL 1 + 1) 27-1 MG TABS tablet Take 1 tablet by mouth daily at 12 noon.   07/10/2016 at Unknown time     Prenatal Transfer Tool  Maternal Diabetes: unknown; pt states she did GTT and was normal, but not in PNR Genetic Screening: Declined Maternal Ultrasounds/Referrals: Normal Fetal Ultrasounds or other Referrals:  None Maternal Substance Abuse:  No Significant Maternal Medications:  None Significant Maternal Lab Results: None     Review of Systems   Constitutional: Negative for fever and chills Eyes: Negative for visual  disturbances Respiratory: Negative for shortness of breath, dyspnea Cardiovascular: Negative for chest pain or palpitations  Gastrointestinal: Negative for vomiting, diarrhea and constipation.  POSITIVE for abdominal pain (contractions) Genitourinary: Negative for dysuria and urgency Musculoskeletal: Negative for back pain, joint pain, myalgias  Neurological: Negative for dizziness and headaches      Blood pressure 120/87, pulse (!) 133, temperature 98.1 F (36.7 C), temperature source Oral, resp. rate 20, SpO2 100 %. General appearance: alert, cooperative and mild distress Lungs: clear to auscultation bilaterally Heart: regular rate and rhythm Abdomen: soft, non-tender; bowel sounds normal Pelvic: moderate amount of bright red blood in vagina. Cx non friable.  No pooling, negative fern and valsalva Extremities: Homans sign is negative, no sign of DVT DTR's 2+ Presentation: cephalic Fetal monitoring  Baseline: 150 bpm, Variability: Good {> 6 bpm), Accelerations: Reactive and Decelerations: Absent Uterine activity  q 2-4 minutes Dilation: 4 Effacement (%): 90 Station: -2 Exam by:: F. Salena Saner. Dishmon, CNM   Prenatal labs: ABO, Rh: --/--/B POS (09/30 2221) Antibody:   Rubella: !Error! RPR: Non Reactive (09/30 2221)  HBsAg: Negative (09/30 2221)  HIV: Non Reactive (09/30 2221)  GBS: Negative (09/30 0000)  1 hr Glucola ?  EFW 58% @ 34 weeks Genetic screening  Too late Anatomy US normal   No  results found for this or any previous visit (from the past 24 hour(s)).  Assessment: [name] is a 25 y.o. G1P0 with an IUP at [redacted]w[redacted]d presenting for probable labor, vaginal bleeding c/w mild abruption.  [place] yesterday did not show abruption  Plan: #Labor: expectant management #Pain:  Per request #FWB Cat 1 #ID: GBS: [id number]  #MOF:  Breast and bottle #MOC: pills #Circ: n/a   Coleman,Anne Marrocco 07/11/2016, 10:04 PM

## 2016-07-11 NOTE — MAU Note (Signed)
Pt reports ctx on and off all morning. Reports some bloody show and good fetal movement.

## 2016-07-11 NOTE — Discharge Instructions (Signed)
Vaginal Bleeding During Pregnancy, Third Trimester  A small amount of bleeding (spotting) from the vagina is common in pregnancy. Sometimes the bleeding is normal and is not a problem, and sometimes it is a sign of something serious. Be sure to tell your doctor about any bleeding from your vagina right away. HOME CARE  Watch your condition for any changes.  Follow your doctor's instructions about how active you can be.  If you are on bed rest:  You may need to stay in bed and only get up to use the bathroom.  You may be allowed to do some activities.  If you need help, make plans for someone to help you.  Write down:  The number of pads you use each day.  How often you change pads.  How soaked (saturated) your pads are.  Do not use tampons.  Do not douche.  Do not have sex or orgasms until your doctor says it is okay.  Follow your doctor's advice about lifting, driving, and doing physical activities.  If you pass any tissue from your vagina, save the tissue so you can show it to your doctor.  Only take medicines as told by your doctor.  Do not take aspirin because it can make you bleed.  Keep all follow-up visits as told by your doctor. GET HELP IF:   You bleed from your vagina.  You have cramps.  You have labor pains.  You have a fever that does not go away after you take medicine. GET HELP RIGHT AWAY IF:  You have very bad cramps in your back or belly (abdomen).  You have chills.  You have a gush of fluid from your vagina.  You pass large clots or tissue from your vagina.  You bleed more.  You feel light-headed or weak.  You pass out (faint).  You do not feel your baby move around as much as before. MAKE SURE YOU:  Understand these instructions.  Will watch your condition.  Will get help right away if you are not doing well or get worse.   This information is not intended to replace advice given to you by your health care provider. Make sure  you discuss any questions you have with your health care provider.   Document Released: 02/07/2014 Document Reviewed: 02/07/2014 Elsevier Interactive Patient Education [employer]2016 [employer]    Third Trimester of Pregnancy The third trimester is from week 29 through week 42, months 7 through 9. This trimester is when your unborn baby (fetus) is growing very fast. At the end of the ninth month, the unborn baby is about 20 inches in length. It weighs about 6-10 pounds.  HOME CARE   Avoid all smoking, herbs, and alcohol. Avoid drugs not approved by your doctor.  Do not use any tobacco products, including cigarettes, chewing tobacco, and electronic cigarettes. If you need help quitting, ask your doctor. You may get counseling or other support to help you quit.  Only take medicine as told by your doctor. Some medicines are safe and some are not during pregnancy.  Exercise only as told by your doctor. Stop exercising if you start having cramps.  Eat regular, healthy meals.  Wear a good support bra if your breasts are tender.  Do not use hot tubs, steam rooms, or saunas.  Wear your seat belt when driving.  Avoid raw meat, uncooked cheese, and liter boxes and soil used by cats.  Take your prenatal vitamins.  Take 1500-2000 milligrams of calcium  daily starting at the 20th week of pregnancy until you deliver your baby.  Try taking medicine that helps you poop (stool softener) as needed, and if your doctor approves. Eat more fiber by eating fresh fruit, vegetables, and whole grains. Drink enough fluids to keep your pee (urine) clear or pale yellow.  Take warm water baths (sitz baths) to soothe pain or discomfort caused by hemorrhoids. Use hemorrhoid cream if your doctor approves.  If you have puffy, bulging veins (varicose veins), wear support hose. Raise (elevate) your feet for 15 minutes, 3-4 times a day. Limit salt in your diet.  Avoid heavy lifting, wear low heels, and sit up  straight.  Rest with your legs raised if you have leg cramps or low back pain.  Visit your dentist if you have not gone during your pregnancy. Use a soft toothbrush to brush your teeth. Be gentle when you floss.  You can have sex (intercourse) unless your doctor tells you not to.  Do not travel far distances unless you must. Only do so with your doctor's approval.  Take prenatal classes.  Practice driving to the hospital.  Pack your hospital bag.  Prepare the baby's room.  Go to your doctor visits. GET HELP IF:  You are not sure if you are in labor or if your water has broken.  You are dizzy.  You have mild cramps or pressure in your lower belly (abdominal).  You have a nagging pain in your belly area.  You continue to feel sick to your stomach (nauseous), throw up (vomit), or have watery poop (diarrhea).  You have bad smelling fluid coming from your vagina.  You have pain with peeing (urination). GET HELP RIGHT AWAY IF:   You have a fever.  You are leaking fluid from your vagina.  You are spotting or bleeding from your vagina.  You have severe belly cramping or pain.  You lose or gain weight rapidly.  You have trouble catching your breath and have chest pain.  You notice sudden or extreme puffiness (swelling) of your face, hands, ankles, feet, or legs.  You have not felt the baby move in over an hour.  You have severe headaches that do not go away with medicine.  You have vision changes.   This information is not intended to replace advice given to you by your health care provider. Make sure you discuss any questions you have with your health care provider.   Document Released: 12/18/2009 Document Revised: 10/14/2014 Document Reviewed: 11/24/2012 Elsevier Interactive Patient Education [employer]

## 2016-07-11 NOTE — MAU Provider Note (Signed)
History     CSN: [id number]  Arrival date and time: 07/11/16 1042     Chief Complaint  Patient presents with  . Labor Eval   HPI  Is a 25 year old G1 P0 at 39 weeks and 1 day who presents to the MAU with significant vaginal bleeding. Patient reports that this started at about 7 AM this morning. She has bled through her pad and stains her pants. She denies any tachycardia palpitations or lightheadedness. She denies any abdominal pain but does endorse occasional contractions. She reports regular fetal movement and reports no loss of fluid.  OB History    Gravida Para Term Preterm AB Living   1         0   SAB TAB Ectopic Multiple Live Births                  Past Medical History:  Diagnosis Date  . Anemia     Past Surgical History:  Procedure Laterality Date  . NO PAST SURGERIES      No family history on file.  Social History  Substance Use Topics  . Smoking status: Never Smoker  . Smokeless tobacco: Never Used  . Alcohol use No    Allergies: No Known Allergies  Prescriptions Prior to Admission  Medication Sig Dispense Refill Last Dose  . prenatal vitamin w/FE, FA (PRENATAL 1 + 1) 27-1 MG TABS tablet Take 1 tablet by mouth daily at 12 noon.   07/10/2016 at Unknown time  . cyclobenzaprine (FLEXERIL) 5 MG tablet Take 1 tablet (5 mg total) by mouth 3 (three) times daily as needed for muscle spasms. (Patient taking differently: Take 5 mg by mouth daily as needed for muscle spasms. ) 15 tablet 0 07/09/2016    Review of Systems  Constitutional: Negative for chills and fever.  HENT: Negative for congestion.   Eyes: Negative for blurred vision and double vision.  Respiratory: Negative for cough and sputum production.   Cardiovascular: Negative for chest pain and palpitations.  Gastrointestinal: Negative for abdominal pain, diarrhea, heartburn, nausea and vomiting.  Genitourinary: Negative for dysuria and urgency.  Skin: Negative for itching and rash.  Neurological:  Negative for headaches.  Endo/Heme/Allergies: Negative for environmental allergies. Does not bruise/bleed easily.   Physical Exam   Blood pressure 104/65, pulse 75, temperature 98.3 F (36.8 C), temperature source Oral, resp. rate 18, weight 133 lb (60.3 kg).  Physical Exam  Constitutional: She is oriented to person, place, and time. She appears well-developed and well-nourished.  HENT:  Head: Normocephalic and atraumatic.  Cardiovascular: Normal rate, regular rhythm and normal heart sounds.   Respiratory: Effort normal and breath sounds normal. No respiratory distress.  GI: Soft. Bowel sounds are normal. She exhibits no distension. There is no tenderness. There is no rebound.  Gravid  Genitourinary:  Genitourinary Comments: Small amount of vaginal bleeding on the glove, minimal vaginal bleeding noted on patient's pad. Cervix 3/50/-3  Musculoskeletal: Normal range of motion. She exhibits no edema, tenderness or deformity.  Neurological: She is alert and oriented to person, place, and time. No cranial nerve deficit.  Skin: Skin is warm. No rash noted. No erythema.  Psychiatric: She has a normal mood and affect. Her behavior is normal.    MAU Course  Procedures  MDM In the MAU patient was at first observed. After about an hour she was rechecked and her cervix was unchanged. She continued had some bright red blood per the vagina however given that she wasn't making  change concerns this was not bloody show. Patient underwent limited OB ultrasound evaluation of the placenta which revealed no abruption or marginal insertion near the cervical os. Patient was rechecked at that time and remained unchanged and was discharged home. Final recheck was performed by Dr. Jen Mow.  Assessment and Plan  Vaginal bleeding, early labor: Patient likely in early labor with particularly heavy bloody show. Ultrasound evaluation of her placenta revealed no problems. By discharge patient's bleeding had  significantly decreased and almost completely resolved. Patient will be discharged home with labor precautions.  Ernestina Penna 07/11/2016, 4:34 PM

## 2016-07-12 ENCOUNTER — Encounter (HOSPITAL_COMMUNITY): Payer: Self-pay

## 2016-07-12 DIAGNOSIS — O4593 Premature separation of placenta, unspecified, third trimester: Secondary | ICD-10-CM

## 2016-07-12 DIAGNOSIS — Z3A39 39 weeks gestation of pregnancy: Secondary | ICD-10-CM

## 2016-07-12 LAB — RPR: RPR: NONREACTIVE

## 2016-07-12 MED ORDER — SENNOSIDES-DOCUSATE SODIUM 8.6-50 MG PO TABS
2.0000 | ORAL_TABLET | ORAL | Status: DC
  Administered 2016-07-12 – 2016-07-14 (×2): 2 via ORAL
  Filled 2016-07-12 (×2): qty 2

## 2016-07-12 MED ORDER — ACETAMINOPHEN 325 MG PO TABS: 650.0000 mg | ORAL_TABLET | ORAL | Status: DC | PRN

## 2016-07-12 MED ORDER — LIDOCAINE HCL (PF) 1 % IJ SOLN
INTRAMUSCULAR | Status: DC | PRN
  Administered 2016-07-11: 6 mL via EPIDURAL
  Administered 2016-07-11: 4 mL

## 2016-07-12 MED ORDER — ONDANSETRON HCL 4 MG/2ML IJ SOLN: 4.0000 mg | INTRAMUSCULAR | Status: DC | PRN

## 2016-07-12 MED ORDER — PRENATAL MULTIVITAMIN CH
1.0000 | ORAL_TABLET | Freq: Every day | ORAL | Status: DC
  Administered 2016-07-13 – 2016-07-14 (×2): 1 via ORAL
  Filled 2016-07-12 (×2): qty 1

## 2016-07-12 MED ORDER — DIBUCAINE 1 % RE OINT
1.0000 "application " | TOPICAL_OINTMENT | RECTAL | Status: DC | PRN
  Filled 2016-07-12: qty 56.7

## 2016-07-12 MED ORDER — BENZOCAINE-MENTHOL 20-0.5 % EX AERO
1.0000 | INHALATION_SPRAY | CUTANEOUS | Status: DC | PRN
Start: 2016-07-12 — End: 2016-07-14
  Administered 2016-07-12: 1 via TOPICAL
  Filled 2016-07-12 (×2): qty 56

## 2016-07-12 MED ORDER — OXYTOCIN 40 UNITS IN LACTATED RINGERS INFUSION - SIMPLE MED
1.0000 m[IU]/min | INTRAVENOUS | Status: DC
  Administered 2016-07-12: 1 m[IU]/min via INTRAVENOUS
  Filled 2016-07-12: qty 1000

## 2016-07-12 MED ORDER — DIPHENHYDRAMINE HCL 25 MG PO CAPS: 25.0000 mg | ORAL_CAPSULE | Freq: Four times a day (QID) | ORAL | Status: DC | PRN

## 2016-07-12 MED ORDER — COCONUT OIL OIL
1.0000 "application " | TOPICAL_OIL | Status: DC | PRN
  Filled 2016-07-12: qty 120

## 2016-07-12 MED ORDER — WITCH HAZEL-GLYCERIN EX PADS: 1.0000 "application " | MEDICATED_PAD | CUTANEOUS | Status: DC | PRN

## 2016-07-12 MED ORDER — SODIUM CHLORIDE 0.9% FLUSH: 3.0000 mL | INTRAVENOUS | Status: DC | PRN

## 2016-07-12 MED ORDER — SIMETHICONE 80 MG PO CHEW: 80.0000 mg | CHEWABLE_TABLET | ORAL | Status: DC | PRN

## 2016-07-12 MED ORDER — ZOLPIDEM TARTRATE 5 MG PO TABS: 5.0000 mg | ORAL_TABLET | Freq: Every evening | ORAL | Status: DC | PRN

## 2016-07-12 MED ORDER — IBUPROFEN 600 MG PO TABS
600.0000 mg | ORAL_TABLET | Freq: Four times a day (QID) | ORAL | Status: DC
  Administered 2016-07-12 – 2016-07-14 (×7): 600 mg via ORAL
  Filled 2016-07-12 (×8): qty 1

## 2016-07-12 MED ORDER — SODIUM CHLORIDE 0.9% FLUSH: 3.0000 mL | Freq: Two times a day (BID) | INTRAVENOUS | Status: DC

## 2016-07-12 MED ORDER — SODIUM CHLORIDE 0.9 % IV SOLN: 250.0000 mL | INTRAVENOUS | Status: DC | PRN

## 2016-07-12 MED ORDER — OXYTOCIN 40 UNITS IN LACTATED RINGERS INFUSION - SIMPLE MED: 2.5000 [IU]/h | INTRAVENOUS | Status: DC | PRN

## 2016-07-12 MED ORDER — TETANUS-DIPHTH-ACELL PERTUSSIS 5-2.5-18.5 LF-MCG/0.5 IM SUSP
0.5000 mL | Freq: Once | INTRAMUSCULAR | Status: DC
  Filled 2016-07-12: qty 0.5

## 2016-07-12 MED ORDER — ONDANSETRON HCL 4 MG PO TABS: 4.0000 mg | ORAL_TABLET | ORAL | Status: DC | PRN

## 2016-07-12 MED ORDER — TERBUTALINE SULFATE 1 MG/ML IJ SOLN: 0.2500 mg | Freq: Once | INTRAMUSCULAR | Status: DC | PRN

## 2016-07-12 NOTE — Progress Notes (Signed)
   [name] is a 25 y.o. G1P0 at 5850w2d  admitted for early labor/bleeding  Subjective:  Comfortable w/epidural. Ctx slowed down after epidural. Bleeding also slowed. Objective: Vitals:   07/12/16 0400 07/12/16 0430 07/12/16 0530 07/12/16 0631  BP: (!) 95/55 108/67 109/66 126/72  Pulse: 78 96 (!) 118 (!) 105  Resp:    18  Temp:  98.1 F (36.7 C)    TempSrc:  Oral    SpO2:      Weight:      Height:       No intake/output data recorded.  FHT:  FHR: 125 bpm, variability: moderate,  accelerations:  Present,  decelerations:  Absent UC:   irregular, every 4-6 minutes SVE:   Dilation: 4 Effacement (%): 80 Station: -2 Exam by:: Anne RoesB. Parks, RN Pitocin @ 2 mu/min  Labs: Lab Results  Component Value Date   WBC 10.7 (H) 07/11/2016   HGB 13.0 07/11/2016   HCT 37.8 07/11/2016   MCV 93.8 07/11/2016   PLT 118 (L) 07/11/2016    Assessment / Plan: early labor, augmenting  Labor: Progressing normally Fetal Wellbeing:  Category I Pain Control:  Epidural Anticipated MOD:  NSVD  [name],[name] 07/12/2016, 6:42 AM

## 2016-07-12 NOTE — Progress Notes (Addendum)
Patient ID: [name], female   DOB: 10/30/1990, 25 y.o.   MRN: [id number]  Patient seen & examined for progress of labor. Comfortable with epidural.  Vitals:   07/12/16 1330 07/12/16 1400 07/12/16 1430 07/12/16 1500  BP: 115/82 118/83 111/73 102/66  Pulse: 99 77 79 (!) 108  Resp:    16  Temp:  98.2 F (36.8 C)    TempSrc:  Oral    SpO2:      Weight:      Height:        Dilation: 7 Effacement (%): 80 Cervical Position: Middle Station: -1 Presentation: Vertex Exam by:: Ida Milbrath  AROM performed, with small amount of moderate mec fluid return.   FHT: 120, mod var, +accels, no decels   Continue current management, increasing pitocin as tolerated and per protocol Expectant management with anticipated SVD

## 2016-07-12 NOTE — Anesthesia Procedure Notes (Signed)
Epidural Patient location during procedure: OB  Preanesthetic Checklist Completed: patient identified, site marked, surgical consent, pre-op evaluation, timeout performed, IV checked, risks and benefits discussed and monitors and equipment checked  Epidural Patient position: sitting Prep: site prepped and draped and DuraPrep Patient monitoring: continuous pulse ox and blood pressure Approach: midline Location: L3-L4 Injection technique: LOR air  Needle:  Needle type: Tuohy  Needle gauge: 17 G Needle length: 9 cm and 9 Needle insertion depth: 5 cm cm Catheter type: closed end flexible Catheter size: 19 Gauge Catheter at skin depth: 10 cm Test dose: negative  Assessment Events: blood not aspirated, injection not painful, no injection resistance, negative IV test and no paresthesia  Additional Notes Dosing of Epidural:  1st dose, through catheter ............................................Marland Kitchen. Xylocaine 30 mg  2nd dose, through catheter, after waiting 3 minutes.......Marland Kitchen.Xylocaine 50 mg     As each dose occurred, patient was free of IV sx; and patient exhibited no evidence of SA injection.  Patient is more comfortable after epidural dosed. Please see RN's note for documentation of vital signs,and FHR which are stable.  Patient reminded not to try to ambulate with numb legs, and that an RN must be present the 1st time she attempts to get up.

## 2016-07-12 NOTE — Anesthesia Pain Management Evaluation Note (Signed)
  CRNA Pain Management Visit Note  Patient: [name], 25 y.o., female  "Hello I am a member of the anesthesia team at [facility]. We have an anesthesia team available at all times to provide care throughout the hospital, including epidural management and anesthesia for C-section. I don't know your plan for the delivery whether it a natural birth, water birth, IV sedation, nitrous supplementation, doula or epidural, but we want to meet your pain goals."   1.Was your pain managed to your expectations on prior hospitalizations?    2.What is your expectation for pain management during this hospitalization?   3.How can we help you reach that goal?   Record the patient's initial score and the patient's pain goal.   Pain: 0  Pain Goal: 3 [facility] wants you to be able to say your pain was always managed very well.  Aarsh Fristoe Hristova 07/12/2016

## 2016-07-13 LAB — CBC
HEMATOCRIT: 28.5 % — AB (ref 36.0–46.0)
Hemoglobin: 9.9 g/dL — ABNORMAL LOW (ref 12.0–15.0)
MCH: 32.4 pg (ref 26.0–34.0)
MCHC: 34.7 g/dL (ref 30.0–36.0)
MCV: 93.1 fL (ref 78.0–100.0)
PLATELETS: 126 10*3/uL — AB (ref 150–400)
RBC: 3.06 MIL/uL — AB (ref 3.87–5.11)
RDW: 14 % (ref 11.5–15.5)
WBC: 13.1 10*3/uL — AB (ref 4.0–10.5)

## 2016-07-13 MED ORDER — INFLUENZA VAC SPLIT QUAD 0.5 ML IM SUSY
0.5000 mL | PREFILLED_SYRINGE | INTRAMUSCULAR | Status: AC
  Administered 2016-07-14: 0.5 mL via INTRAMUSCULAR
  Filled 2016-07-13: qty 0.5

## 2016-07-13 NOTE — Progress Notes (Signed)
Post Partum Day #1 Subjective: no complaints, up ad lib and tolerating PO; breastfeeding going well; desires POPs for contraception  Objective: Blood pressure (!) 83/56, pulse 75, temperature 98.1 F (36.7 C), temperature source Oral, resp. rate 18, height 5' (1.524 m), weight 61.2 kg (135 lb), SpO2 100 %, unknown if currently breastfeeding.  Physical Exam:  General: alert, cooperative and no distress Lochia: appropriate Uterine Fundus: firm DVT Evaluation: No evidence of DVT seen on physical exam.   Recent Labs  07/11/16 2135 07/13/16 0507  HGB 13.0 9.9*  HCT 37.8 28.5*    Assessment/Plan: Plan for discharge tomorrow   LOS: 2 days   Cam HaiSHAW, KIMBERLY CNM 07/13/2016, 9:35 AM

## 2016-07-13 NOTE — Lactation Note (Signed)
This note was copied from a baby's chart. Lactation Consultation Note  Patient Name: Girl Jule Sersabeeh Calip ZOXWR'UToday's Date: 07/13/2016 Reason for consult: Initial assessment  Baby 26 hours old. Offered interpretive services but MOB declined and FOB assisted. Enc parents to ask for interpreter at any point as needed. Mom states that baby nursing well and she is hearing swallows at the breast. However, mom does not believe she has much colostrum. Mom reports that she is using DEBP but not seeing very much. Assisted mom with hand expression with colostrum flowing bilaterally. Discussed with mom that sometime colostrum does not flow with the pump, but the pump provides additional stimulation that encourages a good breast milk supply. Enc mom to continue putting baby to breast with cues and then post-pumping afterwards followed by hand expression.  Offered to assist mom with latching, but mom declined stating that baby just nursed within the hour. Enc mom to call for assistance with latching as needed.  Maternal Data Has patient been taught Hand Expression?: Yes Does the patient have breastfeeding experience prior to this delivery?: No  Feeding Feeding Type: Breast Fed Length of feed: 25 min  LATCH Score/Interventions                      Lactation Tools Discussed/Used     Consult Status Consult Status: Follow-up Date: 07/14/16 Follow-up type: In-patient    Sherlyn HayJennifer D Sahas Sluka 07/13/2016, 10:33 PM

## 2016-07-13 NOTE — Anesthesia Postprocedure Evaluation (Signed)
Anesthesia Post Note  Patient: [name]  Procedure(s) Performed: * No procedures listed *  Patient location during evaluation: Mother Baby Anesthesia Type: Epidural Level of consciousness: awake and alert Pain management: pain level controlled Vital Signs Assessment: post-procedure vital signs reviewed and stable Respiratory status: spontaneous breathing and nonlabored ventilation Cardiovascular status: stable Postop Assessment: no headache, patient able to bend at knees, no backache, no signs of nausea or vomiting, epidural receding and adequate PO intake Anesthetic complications: no     Last Vitals:  Vitals:   07/12/16 2325 07/13/16 0325  BP: 114/66 (!) 83/56  Pulse: 97 75  Resp: 18 18  Temp: 37.4 C 36.7 C    Last Pain:  Vitals:   07/13/16 0325  TempSrc: Oral  PainSc: 1    Pain Goal: Patients Stated Pain Goal: 1 (07/12/16 2325)               Laban EmperorMalinova,Zetta Stoneman Hristova

## 2016-07-14 MED ORDER — IBUPROFEN 600 MG PO TABS: 600.0000 mg | ORAL_TABLET | Freq: Four times a day (QID) | ORAL | 0 refills | Status: DC

## 2016-07-14 MED ORDER — NORETHINDRONE 0.35 MG PO TABS: 1.0000 | ORAL_TABLET | Freq: Every day | ORAL | 11 refills | Status: DC

## 2016-07-14 NOTE — Discharge Summary (Signed)
OB Discharge Summary  Patient Name: [name] DOB: 24-Aug-1991 MRN: [id number]  Date of admission: 07/11/2016 Delivering MD: Jen Mow [facility]   Date of discharge: 07/14/2016  Admitting diagnosis: 39w water broke, ctx, possible labor Intrauterine pregnancy: [redacted]w[redacted]d     Secondary diagnosis:Active Problems:   Vaginal bleeding  Additional problems:none     Discharge diagnosis: Term Pregnancy Delivered                                                                     Post partum procedures:n/a  Augmentation: n/a  Complications: None  Hospital course:  Onset of Labor With Vaginal Delivery     25 y.o. yo G1P1001 at [redacted]w[redacted]d was admitted in Latent Labor on 07/11/2016. Patient had an uncomplicated labor course as follows:  Membrane Rupture Time/Date: 2:55 PM ,07/12/2016   Intrapartum Procedures: Episiotomy:                                           Lacerations:  Labial [10];2nd degree [3];Perineal [11]  Patient had a delivery of a Viable infant. 07/12/2016  Information for the patient's newborn:  Anne, Coleman [[id number]]  Delivery Method: Vag-Spont    Pateint had an uncomplicated postpartum course.  She is ambulating, tolerating a regular diet, passing flatus, and urinating well. Patient is discharged home in stable condition on 07/14/16.    Physical exam Vitals:   07/13/16 0325 07/13/16 1045 07/13/16 1833 07/14/16 0515  BP: (!) 83/56 96/62 100/63 101/61  Pulse: 75 78 80 69  Resp: 18  14 18   Temp: 98.1 F (36.7 C)  98.7 F (37.1 C) 97.9 F (36.6 C)  TempSrc: Oral  Oral Oral  SpO2:   100%   Weight:      Height:       General: alert, cooperative and no distress Lochia: appropriate Uterine Fundus: firm Incision: N/A DVT Evaluation: No evidence of DVT seen on physical exam. Labs: Lab Results  Component Value Date   WBC 13.1 (H) 07/13/2016   HGB 9.9 (L) 07/13/2016   HCT 28.5 (L) 07/13/2016   MCV 93.1 07/13/2016   PLT 126 (L) 07/13/2016   No  flowsheet data found.  Discharge instruction: per After Visit Summary and "Baby and Me Booklet".  After Visit Meds:    Medication List    TAKE these medications   cyclobenzaprine 5 MG tablet Commonly known as:  FLEXERIL Take 1 tablet (5 mg total) by mouth 3 (three) times daily as needed for muscle spasms.   ibuprofen 600 MG tablet Commonly known as:  ADVIL,MOTRIN Take 1 tablet (600 mg total) by mouth every 6 (six) hours.   prenatal vitamin w/FE, FA 27-1 MG Tabs tablet Take 1 tablet by mouth daily at 12 noon.       Diet: routine diet  Activity: Advance as tolerated. Pelvic rest for 6 weeks.   Outpatient follow up:6 weeks Follow up Appt:Future Appointments Date Time Provider Department Center  08/20/2016 1:20 PM Donette Larry, CNM [facility]   Follow up visit: No Follow-up on file.  Postpartum contraception: Progesterone only pills  Newborn Data: Live born  female  Birth Weight: 6 lb 12.8 oz (3085 g) APGAR: 8, 9  Baby Feeding: Breast Disposition:home with mother   07/14/2016 [name], CNM

## 2016-07-14 NOTE — Lactation Note (Signed)
This note was copied from a baby's chart. Lactation Consultation Note  Patient Name: Girl Jule Sersabeeh Imler [id number]'JToday's Date: 07/14/2016  Follow up visit made.  Mom states baby is latching well and nursing often.  Reviewed cluster feeding and reassured.  Discussed milk coming to volume and engorgement treatment.  No questions or concerns at present.  Outpatient lactation services and support reviewed and encouraged.   Maternal Data    Feeding    LATCH Score/Interventions                Hold (Positioning): Assistance needed to correctly position infant at breast and maintain latch.     Lactation Tools Discussed/Used     Consult Status      Huston FoleyMOULDEN, Lathan Gieselman S 07/14/2016, 10:22 AM

## 2016-07-14 NOTE — Discharge Instructions (Signed)
°Iron-Rich Diet ° °Iron is a mineral that helps your body to produce hemoglobin. Hemoglobin is a protein in your red blood cells that carries oxygen to your body's tissues. Eating too little iron may cause you to feel weak and tired, and it can increase your risk for infection. Eating enough iron is necessary for your body's metabolism, muscle function, and nervous system. °Iron is naturally found in many foods. It can also be added to foods or fortified in foods. There are two types of dietary iron: °· Heme iron. Heme iron is absorbed by the body more easily than nonheme iron. Heme iron is found in meat, poultry, and fish. °· Nonheme iron. Nonheme iron is found in dietary supplements, iron-fortified grains, beans, and vegetables. °You may need to follow an iron-rich diet if: °· You have been diagnosed with iron deficiency or iron-deficiency anemia. °· You have a condition that prevents you from absorbing dietary iron, such as: °¨ Infection in your intestines. °¨ Celiac disease. This involves long-lasting (chronic) inflammation of your intestines. °· You do not eat enough iron. °· You eat a diet that is high in foods that impair iron absorption. °· You have lost a lot of blood. °· You have heavy bleeding during your menstrual cycle. °· You are pregnant. °WHAT IS MY PLAN? °Your health care provider may help you to determine how much iron you need per day based on your condition. Generally, when a person consumes sufficient amounts of iron in the diet, the following iron needs are met: °· Men. °¨ 14-18 years old: 11 mg per day. °¨ 19-50 years old: 8 mg per day. °· Women.   °¨ 14-18 years old: 15 mg per day. °¨ 19-50 years old: 18 mg per day. °¨ Over 50 years old: 8 mg per day. °¨ Pregnant women: 27 mg per day. °¨ Breastfeeding women: 9 mg per day. °WHAT DO I NEED TO KNOW ABOUT AN IRON-RICH DIET? °· Eat fresh fruits and vegetables that are high in vitamin C along with foods that are high in iron. This will help  increase the amount of iron that your body absorbs from food, especially with foods containing nonheme iron. Foods that are high in vitamin C include oranges, peppers, tomatoes, and mango. °· Take iron supplements only as directed by your health care provider. Overdose of iron can be life-threatening. If you were prescribed iron supplements, take them with orange juice or a vitamin C supplement. °· Cook foods in pots and pans that are made from iron.   °· Eat nonheme iron-containing foods alongside foods that are high in heme iron. This helps to improve your iron absorption.   °· Certain foods and drinks contain compounds that impair iron absorption. Avoid eating these foods in the same meal as iron-rich foods or with iron supplements. These include: °¨ Coffee, black tea, and red wine. °¨ Milk, dairy products, and foods that are high in calcium. °¨ Beans, soybeans, and peas. °¨ Whole grains. °· When eating foods that contain both nonheme iron and compounds that impair iron absorption, follow these tips to absorb iron better.   °¨ Soak beans overnight before cooking. °¨ Soak whole grains overnight and drain them before using. °¨ Ferment flours before baking, such as using yeast in bread dough. °WHAT FOODS CAN I EAT? °Grains  °Iron-fortified breakfast cereal. Iron-fortified whole-wheat bread. Enriched rice. Sprouted grains. °Vegetables  °Spinach. Potatoes with skin. Green peas. Broccoli. Red and green bell peppers. Fermented vegetables. °Fruits  °Prunes. Raisins. Oranges. Strawberries. Mango. Grapefruit. °Meats and Other   Protein Sources Beef liver. Oysters. Beef. Shrimp. [place]. Chicken. [place]. Sardines. Chickpeas. Nuts. Tofu. Beverages Tomato juice. Fresh orange juice. Prune juice. Hibiscus tea. Fortified instant breakfast shakes. Condiments Tahini. Fermented soy sauce. Sweets and Desserts Black-strap molasses.  Other Wheat germ. The items listed above may not be a complete list of recommended foods or  beverages. Contact your dietitian for more options. WHAT FOODS ARE NOT RECOMMENDED? Grains Whole grains. Bran cereal. Bran flour. Oats. Vegetables Artichokes. Brussels sprouts. Kale. Fruits Blueberries. Raspberries. Strawberries. Figs. Meats and Other Protein Sources Soybeans. Products made from soy protein. Dairy Milk. Cream. Cheese. Yogurt. Cottage cheese. Beverages Coffee. Black tea. Red wine. Sweets and Desserts Cocoa. Chocolate. Ice cream. Other Basil. Oregano. Parsley. The items listed above may not be a complete list of foods and beverages to avoid. Contact your dietitian for more information.   This information is not intended to replace advice given to you by your health care provider. Make sure you discuss any questions you have with your health care provider.   Document Released: 05/07/2005 Document Revised: 10/14/2014 Document Reviewed: 04/20/2014 Elsevier Interactive Patient Education [employer].

## 2016-07-18 ENCOUNTER — Encounter: Payer: Self-pay | Admitting: Obstetrics and Gynecology

## 2016-08-20 ENCOUNTER — Ambulatory Visit: Payer: Self-pay | Admitting: Certified Nurse Midwife

## 2016-10-03 ENCOUNTER — Ambulatory Visit: Payer: Medicaid Other | Admitting: Family Medicine

## 2016-10-03 ENCOUNTER — Encounter: Payer: Self-pay | Admitting: Student

## 2016-10-03 ENCOUNTER — Ambulatory Visit (INDEPENDENT_AMBULATORY_CARE_PROVIDER_SITE_OTHER): Payer: Medicaid Other | Admitting: Family Medicine

## 2016-10-03 VITALS — BP 114/85 | HR 79 | Wt 113.3 lb

## 2016-10-03 DIAGNOSIS — Z30017 Encounter for initial prescription of implantable subdermal contraceptive: Secondary | ICD-10-CM

## 2016-10-03 DIAGNOSIS — Z309 Encounter for contraceptive management, unspecified: Secondary | ICD-10-CM

## 2016-10-03 DIAGNOSIS — Z01812 Encounter for preprocedural laboratory examination: Secondary | ICD-10-CM | POA: Diagnosis not present

## 2016-10-03 LAB — POCT PREGNANCY, URINE: Preg Test, Ur: NEGATIVE

## 2016-10-03 MED ORDER — ETONOGESTREL 68 MG ~~LOC~~ IMPL
68.0000 mg | DRUG_IMPLANT | Freq: Once | SUBCUTANEOUS | Status: AC
  Administered 2016-10-03: 68 mg via SUBCUTANEOUS

## 2016-10-03 NOTE — Patient Instructions (Signed)
Etonogestrel implant °What is this medicine? °ETONOGESTREL (et oh noe JES trel) is a contraceptive (birth control) device. It is used to prevent pregnancy. It can be used for up to 3 years. °COMMON BRAND NAME(S): Implanon, Nexplanon °What should I tell my health care provider before I take this medicine? °They need to know if you have any of these conditions: °-abnormal vaginal bleeding °-blood vessel disease or blood clots °-cancer of the breast, cervix, or liver °-depression °-diabetes °-gallbladder disease °-headaches °-heart disease or recent heart attack °-high blood pressure °-high cholesterol °-kidney disease °-liver disease °-renal disease °-seizures °-tobacco smoker °-an unusual or allergic reaction to etonogestrel, other hormones, anesthetics or antiseptics, medicines, foods, dyes, or preservatives °-pregnant or trying to get pregnant °-breast-feeding °How should I use this medicine? °This device is inserted just under the skin on the inner side of your upper arm by a health care professional. °Talk to your pediatrician regarding the use of this medicine in children. Special care may be needed. °What if I miss a dose? °This does not apply. °What may interact with this medicine? °Do not take this medicine with any of the following medications: °-amprenavir °-bosentan °-fosamprenavir °This medicine may also interact with the following medications: °-barbiturate medicines for inducing sleep or treating seizures °-certain medicines for fungal infections like ketoconazole and itraconazole °-griseofulvin °-medicines to treat seizures like carbamazepine, felbamate, oxcarbazepine, phenytoin, topiramate °-modafinil °-phenylbutazone °-rifampin °-some medicines to treat HIV infection like atazanavir, indinavir, lopinavir, nelfinavir, tipranavir, ritonavir °-St. John's wort °What should I watch for while using this medicine? °This product does not protect you against HIV infection (AIDS) or other sexually transmitted  diseases. °You should be able to feel the implant by pressing your fingertips over the skin where it was inserted. Contact your doctor if you cannot feel the implant, and use a non-hormonal birth control method (such as condoms) until your doctor confirms that the implant is in place. If you feel that the implant may have broken or become bent while in your arm, contact your healthcare provider. °What side effects may I notice from receiving this medicine? °Side effects that you should report to your doctor or health care professional as soon as possible: °-allergic reactions like skin rash, itching or hives, swelling of the face, lips, or tongue °-breast lumps °-changes in emotions or moods °-depressed mood °-heavy or prolonged menstrual bleeding °-pain, irritation, swelling, or bruising at the insertion site °-scar at site of insertion °-signs of infection at the insertion site such as fever, and skin redness, pain or discharge °-signs of pregnancy °-signs and symptoms of a blood clot such as breathing problems; changes in vision; chest pain; severe, sudden headache; pain, swelling, warmth in the leg; trouble speaking; sudden numbness or weakness of the face, arm or leg °-signs and symptoms of liver injury like dark yellow or brown urine; general ill feeling or flu-like symptoms; light-colored stools; loss of appetite; nausea; right upper belly pain; unusually weak or tired; yellowing of the eyes or skin °-unusual vaginal bleeding, discharge °-signs and symptoms of a stroke like changes in vision; confusion; trouble speaking or understanding; severe headaches; sudden numbness or weakness of the face, arm or leg; trouble walking; dizziness; loss of balance or coordination °Side effects that usually do not require medical attention (report to your doctor or health care professional if they continue or are bothersome): °-acne °-back pain °-breast pain °-changes in weight °-dizziness °-general ill feeling or flu-like  symptoms °-headache °-irregular menstrual bleeding °-nausea °-sore throat °-vaginal   irritation or inflammation °Where should I keep my medicine? °This drug is given in a hospital or clinic and will not be stored at home. °© 2017 Elsevier/Gold Standard (2015-10-26 10:56:20) ° °

## 2016-10-03 NOTE — Progress Notes (Signed)
Subjective:   States bled after having baby 45 days, then stopped and got period 09/13/16 and 09/29/16. Started birth control pill after delivery . Is breastfeeding.Used [employer] video interpreter [name] [id number].   [name] is a 25 y.o. female who presents for a postpartum visit. She is 11 weeks postpartum following a spontaneous vaginal delivery. I have fully reviewed the prenatal and intrapartum course. The delivery was at 39 gestational weeks. Outcome: spontaneous vaginal delivery. Anesthesia: epidural. Postpartum course has been notable for bleeding. Baby's course has been normal. Baby is feeding by breast. Bleeding staining only. Bowel function is normal. Bladder function is normal. Patient is sexually active. Contraception method is OCP (estrogen/progesterone). Postpartum depression screening: negative.  The following portions of the patient's history were reviewed and updated as appropriate: allergies, current medications, past family history, past medical history, past social history, past surgical history and problem list.  Review of Systems Pertinent items are noted in HPI.   Objective:    There were no vitals taken for this visit.  General:  alert, cooperative and appears stated age  Heart:  regular rate and rhythm  Abdomen: soft, non-tender; bowel sounds normal; no masses,  no organomegaly       Procedure: Patient given informed consent, signed copy in the chart, time out was performed. Pregnancy test was not done, as she has been on OC's since birth. Appropriate time out taken.  Patient's left arm was prepped and draped in the usual sterile fashion.. The ruler used to measure and mark insertion area.  Pt was prepped with alcohol swab and then injected with 3 cc of 1% lidocaine with epinephrine.  Pt was prepped with betadine, Nexplanon removed form packaging,  Device confirmed in needle, then inserted full length of needle and withdrawn per handbook instructions.  Pt insertion site  covered with steri-strip and pressure dressing.   Minimal blood loss.  Pt tolerated the procedure well.   Assessment:    Normal  postpartum exam. Pap smear not done at today's visit.   Plan:    1. Contraception: Nexplanon 2. Pap due 2019 3. Follow up in: 1 year or as needed.

## 2016-10-06 ENCOUNTER — Other Ambulatory Visit: Payer: Self-pay | Admitting: Obstetrics and Gynecology

## 2016-10-06 ENCOUNTER — Other Ambulatory Visit: Payer: Self-pay | Admitting: Internal Medicine

## 2017-03-02 ENCOUNTER — Encounter (HOSPITAL_BASED_OUTPATIENT_CLINIC_OR_DEPARTMENT_OTHER): Payer: Self-pay

## 2019-02-24 ENCOUNTER — Telehealth: Payer: Self-pay | Admitting: Obstetrics and Gynecology

## 2019-02-24 NOTE — Telephone Encounter (Signed)
The patient called in to have the IUD removed. Stated she would like to try to have a baby soon. Also the IUD make her periods heavy and she is bleeding a brownish substance. She also stated she does not want a baby now, would like to remain on pill until she decides.

## 2019-04-01 ENCOUNTER — Ambulatory Visit: Payer: Self-pay | Admitting: Family Medicine

## 2019-04-01 ENCOUNTER — Other Ambulatory Visit: Payer: Self-pay

## 2019-04-01 VITALS — BP 112/77 | HR 74 | Temp 98.4°F | Ht 64.0 in | Wt 139.9 lb

## 2019-04-01 DIAGNOSIS — Z3041 Encounter for surveillance of contraceptive pills: Secondary | ICD-10-CM

## 2019-04-01 MED ORDER — NORGESTIMATE-ETH ESTRADIOL 0.25-35 MG-MCG PO TABS: 1.0000 | ORAL_TABLET | Freq: Every day | ORAL | 3 refills | Status: DC

## 2019-04-01 NOTE — Progress Notes (Signed)
Pt wants BC Pills. 

## 2019-04-01 NOTE — Progress Notes (Signed)
     GYNECOLOGY OFFICE PROCEDURE NOTE  [name] is a 28 y.o. G1P1001 here for Nexplanon removal. Last pap smear was on 04/29/2016 and was normal.  No other gynecologic concerns.  Nexplanon Removal Patient identified, informed consent performed, consent signed.   Appropriate time out taken. Nexplanon site identified.  Area prepped in usual sterile fashon. One ml of 1% lidocaine was used to anesthetize the area at the distal end of the implant. A small stab incision was made right beside the implant on the distal portion.  The Nexplanon rod was grasped using hemostats and removed without difficulty.  There was minimal blood loss. There were no complications.  3 ml of 1% lidocaine was injected around the incision for post-procedure analgesia.  Steri-strips were applied over the small incision.  A pressure bandage was applied to reduce any bruising.  The patient tolerated the procedure well and was given post procedure instructions.  Patient is planning to use OC's for contraception.    Donnamae Jude, MD  04/01/2019 4:21 PM

## 2019-04-13 ENCOUNTER — Encounter: Payer: Self-pay | Admitting: *Deleted

## 2019-05-05 ENCOUNTER — Ambulatory Visit (INDEPENDENT_AMBULATORY_CARE_PROVIDER_SITE_OTHER): Payer: BLUE CROSS/BLUE SHIELD | Admitting: Family Medicine

## 2019-05-05 ENCOUNTER — Other Ambulatory Visit: Payer: Self-pay

## 2019-05-05 ENCOUNTER — Encounter: Payer: Self-pay | Admitting: Family Medicine

## 2019-05-05 VITALS — BP 130/87 | HR 105 | Temp 98.8°F | Ht 64.0 in | Wt 137.8 lb

## 2019-05-05 DIAGNOSIS — N898 Other specified noninflammatory disorders of vagina: Secondary | ICD-10-CM

## 2019-05-05 DIAGNOSIS — Z113 Encounter for screening for infections with a predominantly sexual mode of transmission: Secondary | ICD-10-CM | POA: Diagnosis not present

## 2019-05-05 DIAGNOSIS — R300 Dysuria: Secondary | ICD-10-CM

## 2019-05-05 MED ORDER — SULFAMETHOXAZOLE-TRIMETHOPRIM 800-160 MG PO TABS: 1.0000 | ORAL_TABLET | Freq: Two times a day (BID) | ORAL | 0 refills | Status: AC

## 2019-05-05 MED ORDER — FLUCONAZOLE 150 MG PO TABS: 150.0000 mg | ORAL_TABLET | Freq: Every day | ORAL | 2 refills | Status: DC

## 2019-05-05 NOTE — Progress Notes (Signed)
   Subjective:    Patient ID: [name] is a 28 y.o. female presenting with Gynecologic Exam  on 05/05/2019  HPI: Had Nexplanon out 6/25. Placed on [place]. Reports pain and cramping nausea from the pills and diarrhea. Now declines contraception as her husband is not here right now.  Having signs of UTI. Reports hesitancy, urgency, dysuria.  Also with vaginal discharge which is white and irritated. Leg pain with her cycles. Vaginal intercourse is quite painful.  Review of Systems  Constitutional: Negative for chills and fever.  Respiratory: Negative for shortness of breath.   Cardiovascular: Negative for chest pain.  Gastrointestinal: Negative for abdominal pain, nausea and vomiting.  Genitourinary: Negative for dysuria.  Skin: Negative for rash.      Objective:    BP 130/87   Pulse (!) 105   Temp 98.8 F (37.1 C)   Ht 5\' 4"  (1.626 m)   Wt 137 lb 12.8 oz (62.5 kg)   LMP 05/05/2019   BMI 23.65 kg/m  Physical Exam Constitutional:      General: She is not in acute distress.    Appearance: She is well-developed.  HENT:     Head: Normocephalic and atraumatic.  Eyes:     General: No scleral icterus. Neck:     Musculoskeletal: Neck supple.  Cardiovascular:     Rate and Rhythm: Normal rate.  Pulmonary:     Effort: Pulmonary effort is normal.  Abdominal:     Palpations: Abdomen is soft.  Skin:    General: Skin is warm and dry.  Neurological:     Mental Status: She is alert and oriented to person, place, and time.       Assessment & Plan:   Problem List Items Addressed This Visit    None    Visit Diagnoses    Vaginal discharge    -  Primary   check wet prep and treat presumptively   Relevant Medications   fluconazole (DIFLUCAN) 150 MG tablet   Other Relevant Orders   Cervicovaginal ancillary only( [place])   Dysuria       check culture and treat presumptively   Relevant Medications   sulfamethoxazole-trimethoprim (BACTRIM DS) 800-160 MG tablet   Other Relevant Orders   Urine Culture      Total face-to-face time with patient: 15 minutes. Over 50% of encounter was spent on counseling and coordination of care. Return if symptoms worsen or fail to improve.  Donnamae Jude 05/05/2019 3:55 PM

## 2019-05-05 NOTE — Progress Notes (Signed)
Pt states have urine urgency & pain.Pt states sever pain during intercourse & has discharge, white in color with some odor.

## 2019-05-05 NOTE — Patient Instructions (Signed)
Vaginitis  Vaginitis is irritation and swelling (inflammation) of the vagina. It happens when normal bacteria and yeast in the vagina grow too much. There are many types of this condition. Treatment will depend on the type you have. Follow these instructions at home: Lifestyle  Keep your vagina area clean and dry. ? Avoid using soap. ? Rinse the area with water.  Do not do the following until your doctor says it is okay: ? Wash and clean out the vagina (douche). ? Use tampons. ? Have sex.  Wipe from front to back after going to the bathroom.  Let air reach your vagina. ? Wear cotton underwear. ? Do not wear: ? Underwear while you sleep. ? Tight pants. ? Thong underwear. ? Underwear or nylons without a cotton panel. ? Take off any wet clothing, such as bathing suits, as soon as possible.  Use gentle, non-scented products. Do not use things that can irritate the vagina, such as fabric softeners. Avoid the following products if they are scented: ? Feminine sprays. ? Detergents. ? Tampons. ? Feminine hygiene products. ? Soaps or bubble baths.  Practice safe sex and use condoms. General instructions  Take over-the-counter and prescription medicines only as told by your doctor.  If you were prescribed an antibiotic medicine, take or use it as told by your doctor. Do not stop taking or using the antibiotic even if you start to feel better.  Keep all follow-up visits as told by your doctor. This is important. Contact a doctor if:  You have pain in your belly.  You have a fever.  Your symptoms last for more than 2-3 days. Get help right away if:  You have a fever and your symptoms get worse all of a sudden. Summary  Vaginitis is irritation and swelling of the vagina. It can happen when the normal bacteria and yeast in the vagina grow too much. There are many types.  Treatment will depend on the type you have.  Do not douche, use tampons , or have sex until your health  care provider approves. When you can return to sex, practice safe sex and use condoms. This information is not intended to replace advice given to you by your health care provider. Make sure you discuss any questions you have with your health care provider. Document Released: 12/20/2008 Document Revised: 09/05/2017 Document Reviewed: 10/15/2016 Elsevier Patient Education  2020 Elsevier Inc.  

## 2019-05-06 LAB — URINE CULTURE

## 2019-05-07 LAB — POCT URINALYSIS DIP (DEVICE)
Glucose, UA: NEGATIVE mg/dL
Ketones, ur: 40 mg/dL — AB
Leukocytes,Ua: NEGATIVE
Nitrite: NEGATIVE
Protein, ur: 100 mg/dL — AB
Specific Gravity, Urine: >=1.03 (ref 1.005–1.030)
Urobilinogen, UA: 0.2 mg/dL (ref 0.0–1.0)
pH: 6 (ref 5.0–8.0)

## 2019-05-07 LAB — URINE CYTOLOGY ANCILLARY ONLY
Bacterial vaginitis: NEGATIVE
Candida vaginitis: NEGATIVE

## 2019-05-14 LAB — CERVICOVAGINAL ANCILLARY ONLY
Chlamydia: NEGATIVE
Neisseria Gonorrhea: NEGATIVE
Trichomonas: NEGATIVE

## 2019-06-11 ENCOUNTER — Other Ambulatory Visit: Payer: Self-pay

## 2019-06-11 DIAGNOSIS — Z20822 Contact with and (suspected) exposure to covid-19: Secondary | ICD-10-CM

## 2019-06-13 LAB — NOVEL CORONAVIRUS, NAA: SARS-CoV-2, NAA: NOT DETECTED

## 2019-10-08 NOTE — L&D Delivery Note (Signed)
Delivery Note NICUs presence requested prior to delivery due to thick meconium At 3:02 PM a viable and healthy female was delivered via Vaginal, Spontaneous (Presentation:   Occiput Anterior).   APGAR: 8,1 ;8  weight 7 lb 0.9 oz (3200 g).   Placenta status: Spontaneous;Pathology, Intact.   Cord: 3 vessels with the following complications: None.    NICU present at time of delivery and shortly after. Infant immediately placed on maternal chest and bulb suctioned mouth and nose. Initially vigorous at 1 minute APGAR and cleared by NICU for skin to skin. NICU exited the room.  At 3 minutes baby became pale with minimal breathing efforts. RN called NICU to return for code APGAR, while performing resuscitative efforts under the warmer. NICU arrived at 3 minutes of life. Baby resumes in the care of NICU team. 10 minute APGAR per NICU team 8. Gases collected off placenta.  Cord pH: pending  Pt not tolerating fundal massage to assist in uterine atony post delivery. Grabbing providers hands. Explained to patient and husband importance due to increased bleeding and uterus boggy Offered pain medication, but indecisive. Pitocin bolus at 999, TXA infused. 200 mcy cytotec giving sublingually. Interpreter requested to aid in further education of babys situation and explanation of necessity of fundal massage to expel clots. Initially requested IV pain medication, however postponed again. Now requesting. Multiple small clots expelled. Fundus firm in between massage but atony returns. 800 mcg cytotec given rectally. Fundus becoming firm, clots and bleeding decreasing. Orders for 2nd bag of pitocin to infuse at 125 cc/hour as well as methergine and indwelling foley x 12 hours. Explained all agents to patient and husband via interpreter. Bleeding now stable.   Anesthesia: Epidural Episiotomy: None Lacerations: 1st degree Suture Repair: 3.0 vicryl Est. Blood Loss (mL): 400  Mom to postpartum.  Baby to  NICU.  Victorino Dike B Jermeka Schlotterbeck 08/10/2020, 4:17 PM

## 2019-12-29 ENCOUNTER — Encounter (HOSPITAL_COMMUNITY): Payer: Self-pay | Admitting: Obstetrics and Gynecology

## 2019-12-29 ENCOUNTER — Other Ambulatory Visit: Payer: Self-pay

## 2019-12-29 ENCOUNTER — Inpatient Hospital Stay (HOSPITAL_COMMUNITY)
Admission: AD | Admit: 2019-12-29 | Discharge: 2019-12-29 | Disposition: A | Discharge status: Home / Self Care | Payer: 59 | Attending: Obstetrics and Gynecology | Admitting: Obstetrics and Gynecology

## 2019-12-29 ENCOUNTER — Inpatient Hospital Stay (HOSPITAL_COMMUNITY): Payer: 59

## 2019-12-29 DIAGNOSIS — Z3A01 Less than 8 weeks gestation of pregnancy: Secondary | ICD-10-CM | POA: Insufficient documentation

## 2019-12-29 DIAGNOSIS — Z3490 Encounter for supervision of normal pregnancy, unspecified, unspecified trimester: Secondary | ICD-10-CM

## 2019-12-29 DIAGNOSIS — O209 Hemorrhage in early pregnancy, unspecified: Secondary | ICD-10-CM | POA: Diagnosis present

## 2019-12-29 LAB — CBC
HCT: 39.3 % (ref 36.0–46.0)
Hemoglobin: 12.6 g/dL (ref 12.0–15.0)
MCH: 29.6 pg (ref 26.0–34.0)
MCHC: 32.1 g/dL (ref 30.0–36.0)
MCV: 92.3 fL (ref 80.0–100.0)
Platelets: 170 10*3/uL (ref 150–400)
RBC: 4.26 MIL/uL (ref 3.87–5.11)
RDW: 12.2 % (ref 11.5–15.5)
WBC: 5.5 10*3/uL (ref 4.0–10.5)
nRBC: 0 % (ref 0.0–0.2)

## 2019-12-29 LAB — WET PREP, GENITAL
Clue Cells Wet Prep HPF POC: NONE SEEN
Sperm: NONE SEEN
Trich, Wet Prep: NONE SEEN
Yeast Wet Prep HPF POC: NONE SEEN

## 2019-12-29 LAB — HCG, QUANTITATIVE, PREGNANCY: hCG, Beta Chain, Quant, S: 151636 m[IU]/mL — ABNORMAL HIGH (ref <5)

## 2019-12-29 LAB — POCT PREGNANCY, URINE: Preg Test, Ur: POSITIVE — AB

## 2019-12-29 LAB — HIV ANTIBODY (ROUTINE TESTING W REFLEX): HIV Screen 4th Generation wRfx: NONREACTIVE

## 2019-12-29 IMAGING — US US OB < 14 WEEKS - US OB TV
1 series · 15 of 28 positions shown · non-contrast
Comparison: None.

CLINICAL DATA: First trimester bleeding

EXAM:
OBSTETRIC <14 WK US AND TRANSVAGINAL OB US
TECHNIQUE: Both transabdominal and transvaginal ultrasound examinations were
performed for complete evaluation of the gestation as well as the
maternal uterus, adnexal regions, and pelvic cul-de-sac.
Transvaginal technique was performed to assess early pregnancy.

[Series 1: us ob < 14 weeks - us ob tv · 15 of 76 slices shown]
[im 1/76]
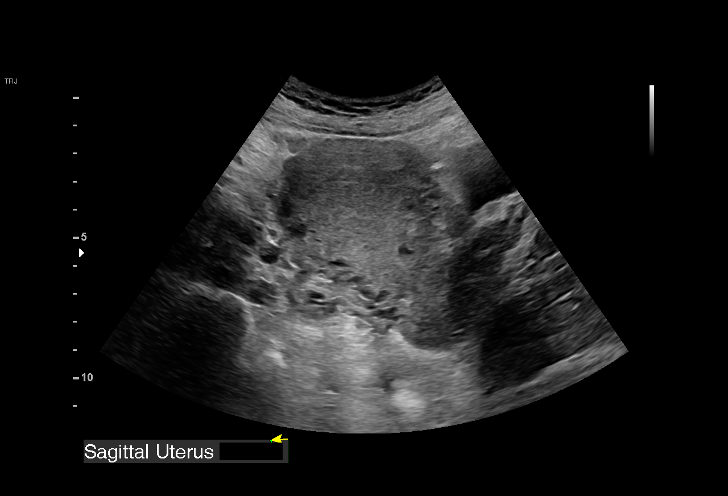
[im 6/76]
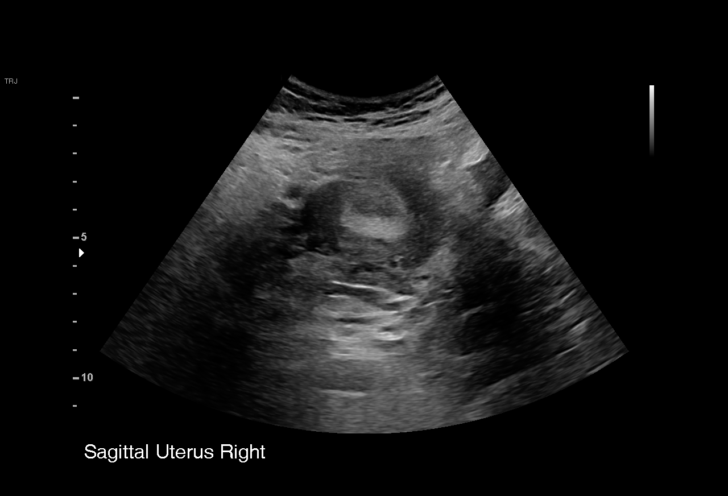
[im 12/76]
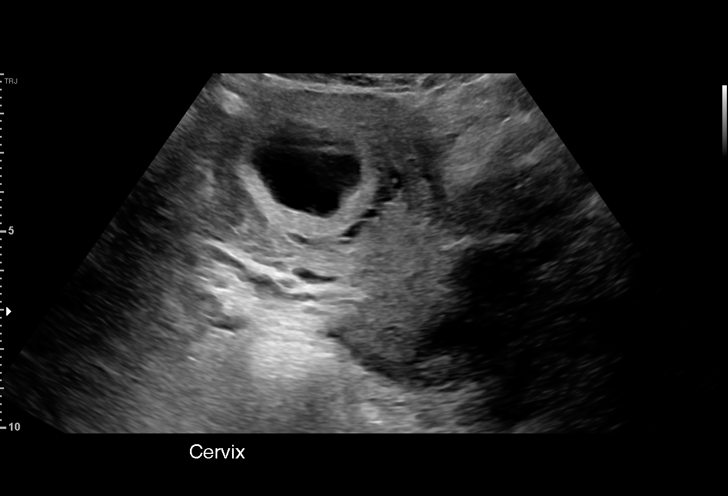
[im 17/76]
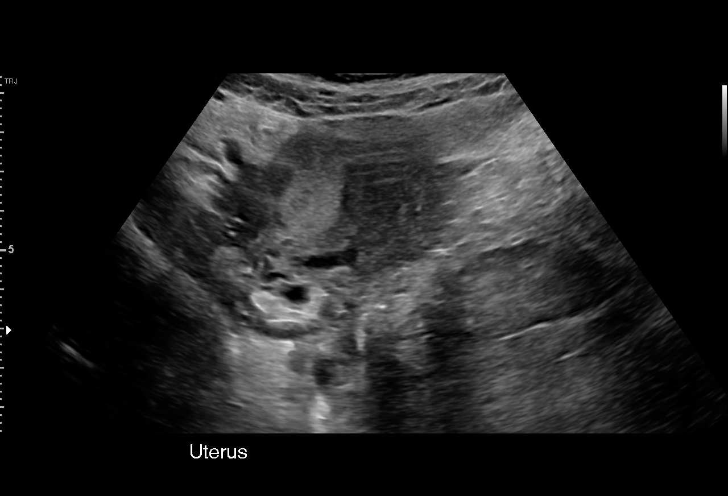
[im 23/76]
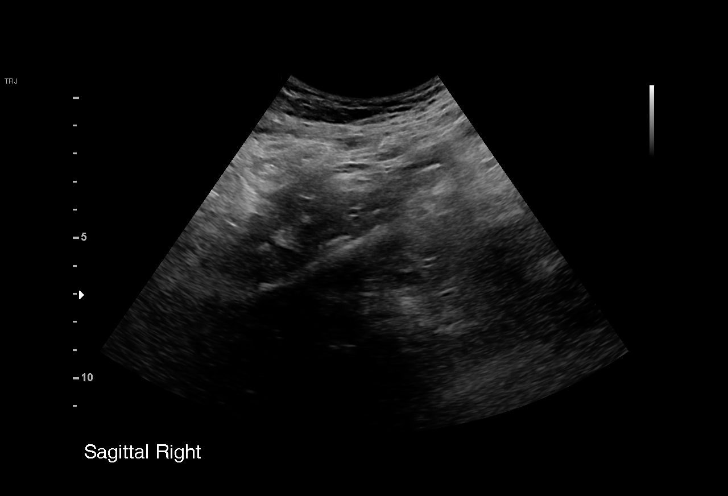
[im 28/76]
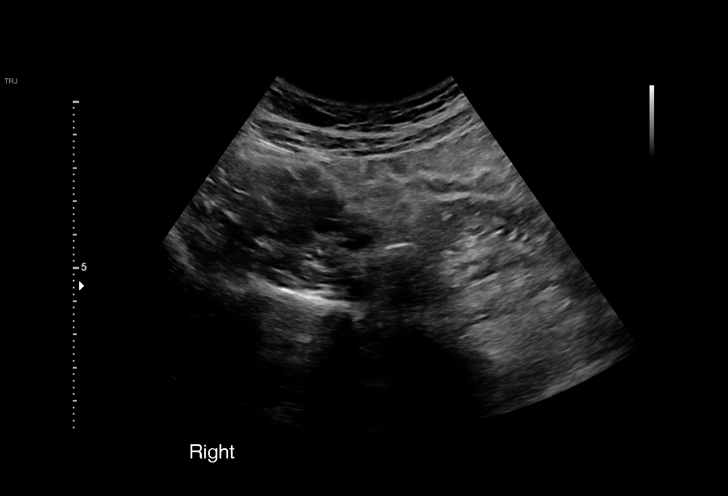
[im 34/76]
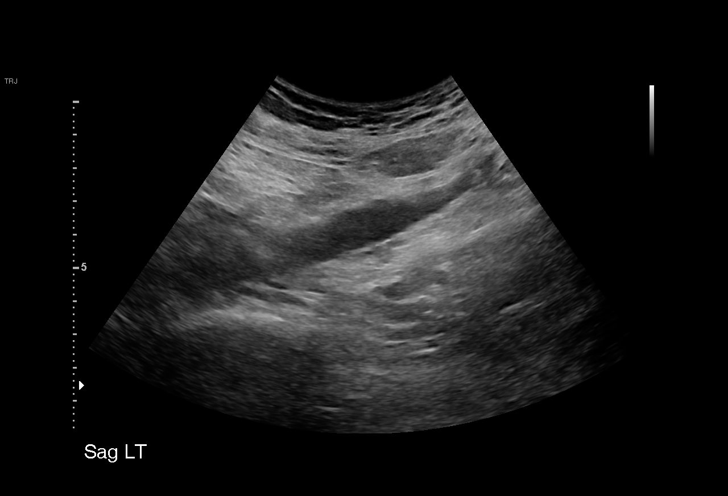
[im 39/76]
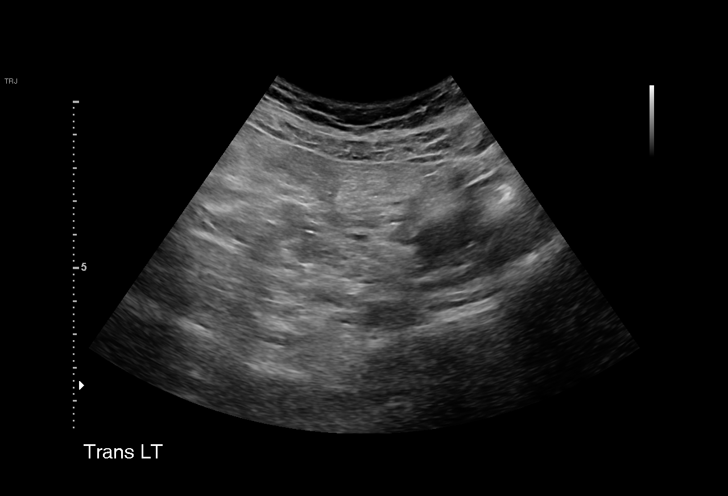
[im 42/76]
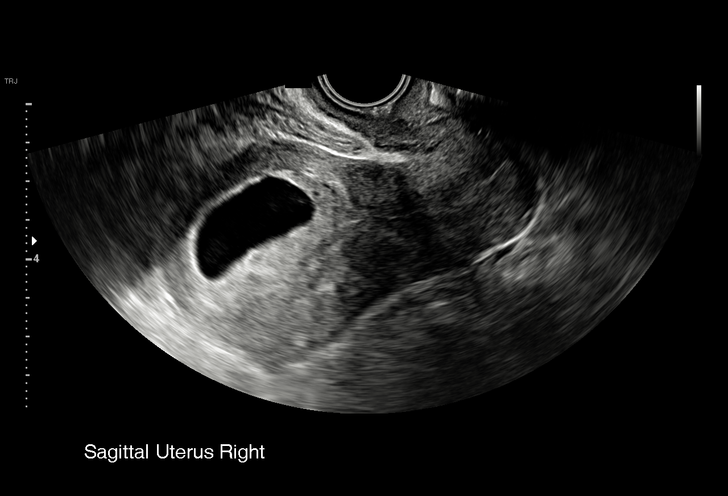
[im 48/76]
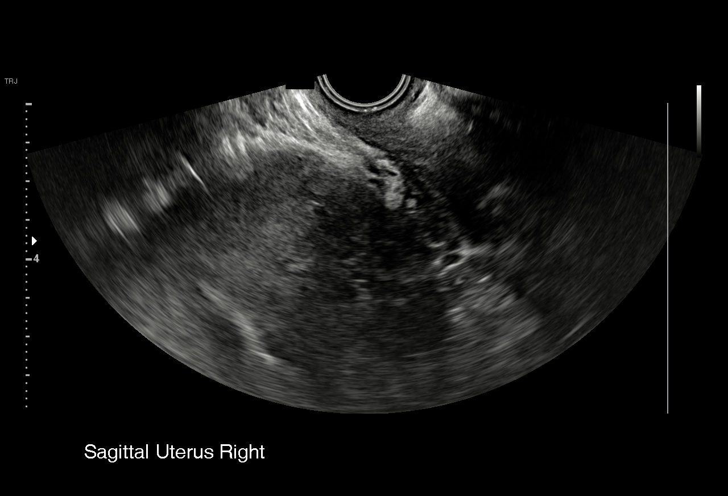
[im 53/76]
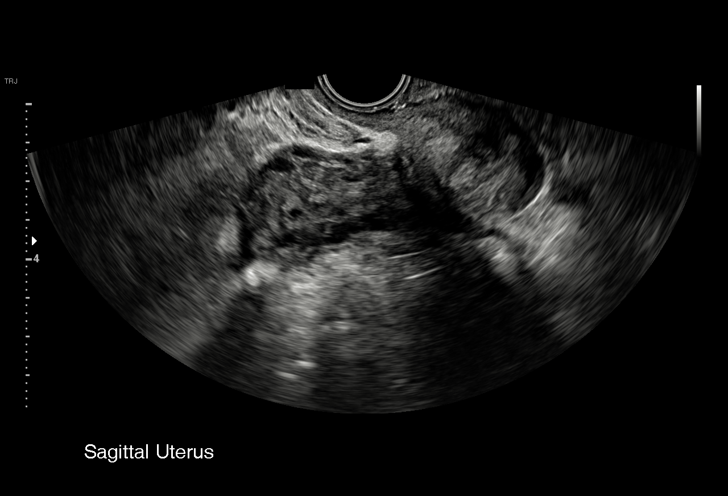
[im 59/76]
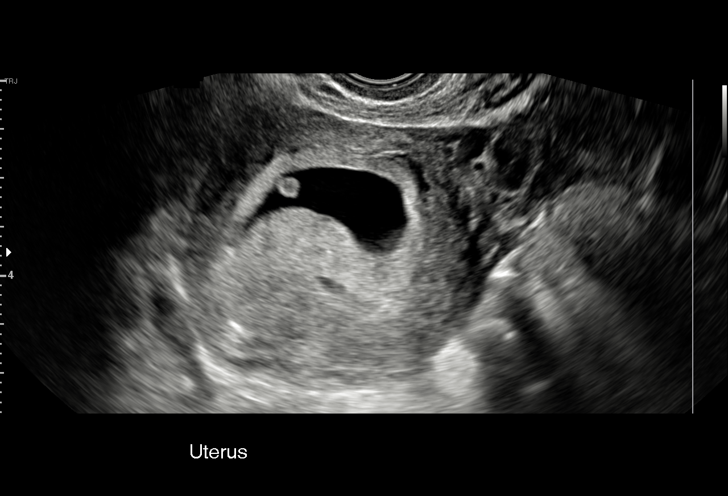
[im 64/76]
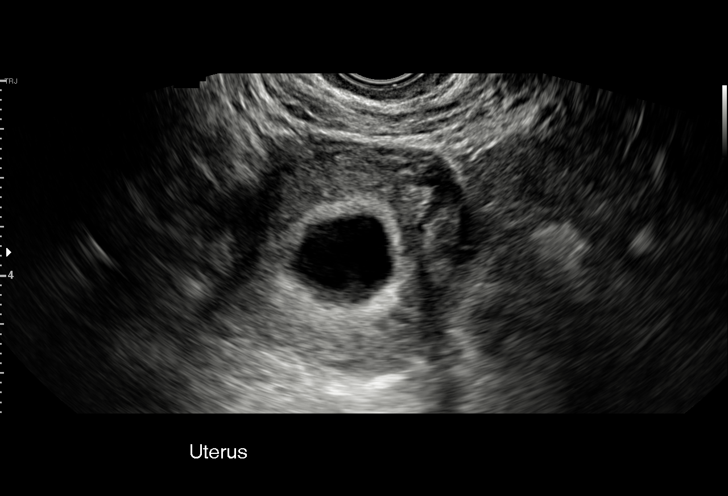
[im 70/76]
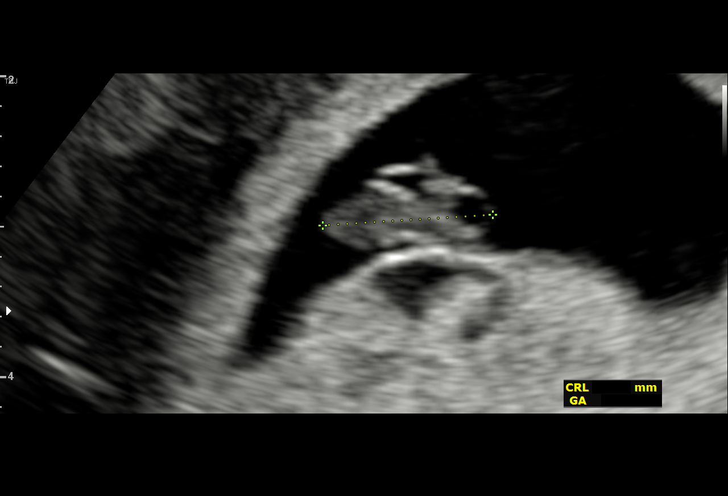
[im 76/76]
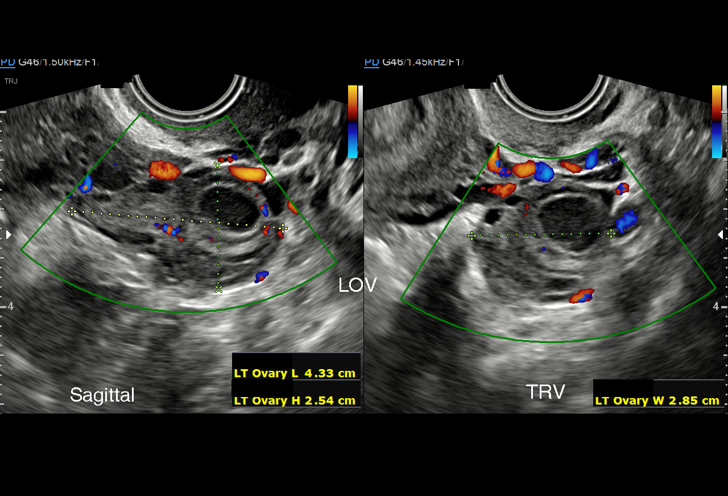

[15 of 28 positions shown; findings below may reference images not displayed]

FINDINGS: Intrauterine gestational sac: Single

Yolk sac:  Visualized.

Embryo:  Visualized.

Cardiac Activity: Visualized.

Heart Rate: 148 bpm

MSD:   mm    w     d

CRL:  11 mm   7 w   1 d                  US EDC: August 15, 2020

Subchorionic hemorrhage:  None visualized.

Maternal uterus/adnexae: Normal right ovary. Corpus luteum cyst in
the left ovary.
IMPRESSION: 1. Single live IUP. No cause for bleeding identified. Corpus luteum
cyst in the left ovary.

## 2019-12-29 NOTE — MAU Provider Note (Signed)
History     CSN: [id number]  Arrival date and time: 12/29/19 1337   First Provider Initiated Contact with Patient 12/29/19 1455 - Arabic video [place] [phone]    Chief Complaint  Patient presents with  . Vaginal Bleeding  . Back Pain  . Fatigue   HPI   Ms.  [name] is a 29 y.o. year old G52P1001 female at [redacted]w[redacted]d weeks gestation who presents to MAU reporting vaginal bleeding yesterday 12/28/19. She reports back pain throughout pregnancy, but increased yesterday. She rates the pain a 5/10. She reports brown and white vaginal discharge and feeling tired. She denies abdominal pain. She reports last SI 1 week ago. She has her first OB appt scheduled on 01/04/20.   Past Medical History:  Diagnosis Date  . Anemia     Past Surgical History:  Procedure Laterality Date  . NO PAST SURGERIES      History reviewed. No pertinent family history.  Social History   Tobacco Use  . Smoking status: Never Smoker  . Smokeless tobacco: Never Used  Substance Use Topics  . Alcohol use: No  . Drug use: No    Allergies: No Known Allergies  Medications Prior to Admission  Medication Sig Dispense Refill Last Dose  . cyclobenzaprine (FLEXERIL) 5 MG tablet Take 1 tablet (5 mg total) by mouth 3 (three) times daily as needed for muscle spasms. (Patient not taking: Reported on 10/03/2016) 15 tablet 0   . fluconazole (DIFLUCAN) 150 MG tablet Take 1 tablet (150 mg total) by mouth daily. Repeat in 24 hours if needed 2 tablet 2   . ibuprofen (ADVIL,MOTRIN) 600 MG tablet Take 1 tablet (600 mg total) by mouth every 6 (six) hours. (Patient not taking: Reported on 05/05/2019) 30 tablet 0     Review of Systems  Constitutional: Negative.   HENT: Negative.   Eyes: Negative.   Respiratory: Negative.   Cardiovascular: Negative.   Gastrointestinal: Negative.   Endocrine: Negative.   Genitourinary: Negative for vaginal bleeding (occurred yesterday, but none today).  Musculoskeletal: Positive  for back pain.  Skin: Negative.   Allergic/Immunologic: Negative.   Neurological: Negative.   Hematological: Negative.   Psychiatric/Behavioral: Negative.    Physical Exam   Blood pressure 111/79, pulse 76, temperature 98.6 F (37 C), temperature source Oral, resp. rate 16, height 5' 3.78" (1.62 m), weight 58.5 kg, last menstrual period 11/11/2019, SpO2 98 %.  Physical Exam  Nursing note and vitals reviewed. Constitutional: She is oriented to person, place, and time. She appears well-developed and well-nourished.  HENT:  Head: Normocephalic and atraumatic.  Eyes: Pupils are equal, round, and reactive to light.  Cardiovascular: Normal rate.  Respiratory: Effort normal.  GI: Soft. Bowel sounds are normal.  Genitourinary:    Genitourinary Comments: Uterus: mildly tender, SE: cervix is smooth, pink, no lesions, scant amt of thin, clear vaginal d/c; no evidenceo of bleeding-- WP, GC/CT done, closed/long/firm, no CMT or friability, no adnexal tenderness    Musculoskeletal:        General: Normal range of motion.     Cervical back: Normal range of motion.  Neurological: She is alert and oriented to person, place, and time.  Skin: Skin is warm and dry.  Psychiatric: She has a normal mood and affect. Her behavior is normal. Judgment and thought content normal.    MAU Course  Procedures  MDM CCUA UPT CBC HCG Wet Prep GC/CT -- pending HIV -- pending OB < 14 wks [place] with TV  Results  for orders placed or performed during the hospital encounter of 12/29/19 (from the past 24 hour(s))  Pregnancy, urine POC     Status: Abnormal   Collection Time: 12/29/19  2:28 PM  Result Value Ref Range   Preg Test, Ur POSITIVE (A) NEGATIVE  Wet prep, genital     Status: Abnormal   Collection Time: 12/29/19  3:03 PM  Result Value Ref Range   Yeast Wet Prep HPF POC NONE SEEN NONE SEEN   Trich, Wet Prep NONE SEEN NONE SEEN   Clue Cells Wet Prep HPF POC NONE SEEN NONE SEEN   WBC, Wet Prep HPF POC  MANY (A) NONE SEEN   Sperm NONE SEEN   HIV Antibody (routine testing w rflx)     Status: None   Collection Time: 12/29/19  3:18 PM  Result Value Ref Range   HIV Screen 4th Generation wRfx NON REACTIVE NON REACTIVE  CBC     Status: None   Collection Time: 12/29/19  3:18 PM  Result Value Ref Range   WBC 5.5 4.0 - 10.5 K/uL   RBC 4.26 3.87 - 5.11 MIL/uL   Hemoglobin 12.6 12.0 - 15.0 g/dL   HCT 93.5 70.1 - 77.9 %   MCV 92.3 80.0 - 100.0 fL   MCH 29.6 26.0 - 34.0 pg   MCHC 32.1 30.0 - 36.0 g/dL   RDW 39.0 30.0 - 92.3 %   Platelets 170 150 - 400 K/uL   nRBC 0.0 0.0 - 0.2 %  hCG, quantitative, pregnancy     Status: Abnormal   Collection Time: 12/29/19  3:18 PM  Result Value Ref Range   hCG, Beta Chain, Quant, S 151,636 (H) <5 mIU/mL    US OB LESS THAN 14 WEEKS WITH OB TRANSVAGINAL  Result Date: 12/29/2019 CLINICAL DATA:  First trimester bleeding EXAM: OBSTETRIC <14 WK [place] AND TRANSVAGINAL OB US TECHNIQUE: Both transabdominal and transvaginal ultrasound examinations were performed for complete evaluation of the gestation as well as the maternal uterus, adnexal regions, and pelvic cul-de-sac. Transvaginal technique was performed to assess early pregnancy. COMPARISON:  None. FINDINGS: Intrauterine gestational sac: Single Yolk sac:  Visualized. Embryo:  Visualized. Cardiac Activity: Visualized. Heart Rate: 148 bpm MSD:   mm    w     d CRL:  11 mm   7 w   1 d                  [place] [facility]: August 15, 2020 Subchorionic hemorrhage:  None visualized. Maternal uterus/adnexae: Normal right ovary. Corpus luteum cyst in the left ovary. IMPRESSION: 1. Single live IUP. No cause for bleeding identified. Corpus luteum cyst in the left ovary. Electronically Signed   By: Gerome Sam III M.D   On: 12/29/2019 16:27     Assessment and Plan  Bleeding in early pregnancy  - Information provided on vaginal bleeding in pregnancy - Advised to not have sex until she sees [facility] office and has been cleared - Reassurance  given that VB was not observed during pelvic exam  - Discharge patient - Patient verbalized an understanding of the plan of care and agrees.    Raelyn Mora, MSN, CNM 12/29/2019, 3:03 PM

## 2019-12-29 NOTE — MAU Note (Signed)
[name] is a 29 y.o. here in MAU reporting: last night around 2000 had some bleeding, states it was more than spotting and was red. Did not see any clots last night. Today bleeding was brown, see some brown on tissue paper in her underwear and when she wipes. Reports back pain, it was worse yesterday but still having it today. States she has been having back pain since she found out she was pregnant and pain was worse last night and today it feels like her normal back pain. Also feels fatigued.   LMP: 11/11/19  Onset of complaint: last night  Pain score: 5/10  Vitals:   12/29/19 1417  BP: 127/85  Pulse: 93  Resp: 16  Temp: 98.6 F (37 C)  SpO2: 98%     Lab orders placed from triage: UPT, pt unable to give sample at this time, pt to wait in lobby until she is able to produce sample

## 2019-12-29 NOTE — Discharge Instructions (Signed)
Abdominal Pain During Pregnancy  Belly (abdominal) pain is common during pregnancy. There are many possible causes. Most of the time, it is not a serious problem. Other times, it can be a sign that something is wrong with the pregnancy. Always tell your doctor if you have belly pain. Follow these instructions at home:  Do not have sex or put anything in your vagina until your pain goes away completely.  Get plenty of rest until your pain gets better.  Drink enough fluid to keep your pee (urine) pale yellow.  Take over-the-counter and prescription medicines only as told by your doctor.  Keep all follow-up visits as told by your doctor. This is important. Contact a doctor if:  Your pain continues or gets worse after resting.  You have lower belly pain that: ? Comes and goes at regular times. ? Spreads to your back. ? Feels like menstrual cramps.  You have pain or burning when you pee (urinate). Get help right away if:  You have a fever or chills.  You have vaginal bleeding.  You are leaking fluid from your vagina.  You are passing tissue from your vagina.  You throw up (vomit) for more than 24 hours.  You have watery poop (diarrhea) for more than 24 hours.  Your baby is moving less than usual.  You feel very weak or faint.  You have shortness of breath.  You have very bad pain in your upper belly. Summary  Belly (abdominal) pain is common during pregnancy. There are many possible causes.  If you have belly pain during pregnancy, tell your doctor right away.  Keep all follow-up visits as told by your doctor. This is important. This information is not intended to replace advice given to you by your health care provider. Make sure you discuss any questions you have with your health care provider. Document Revised: 01/11/2019 Document Reviewed: 12/26/2016 Elsevier Patient Education  2020 [employer]. First Trimester of Pregnancy  The first trimester of pregnancy  is from week 1 until the end of week 13 (months 1 through 3). During this time, your baby will begin to develop inside you. At 6-8 weeks, the eyes and face are formed, and the heartbeat can be seen on ultrasound. At the end of 12 weeks, all the baby's organs are formed. Prenatal care is all the medical care you receive before the birth of your baby. Make sure you get good prenatal care and follow all of your doctor's instructions. Follow these instructions at home: Medicines  Take over-the-counter and prescription medicines only as told by your doctor. Some medicines are safe and some medicines are not safe during pregnancy.  Take a prenatal vitamin that contains at least 600 micrograms (mcg) of folic acid.  If you have trouble pooping (constipation), take medicine that will make your stool soft (stool softener) if your doctor approves. Eating and drinking   Eat regular, healthy meals.  Your doctor will tell you the amount of weight gain that is right for you.  Avoid raw meat and uncooked cheese.  If you feel sick to your stomach (nauseous) or throw up (vomit): ? Eat 4 or 5 small meals a day instead of 3 large meals. ? Try eating a few soda crackers. ? Drink liquids between meals instead of during meals.  To prevent constipation: ? Eat foods that are high in fiber, like fresh fruits and vegetables, whole grains, and beans. ? Drink enough fluids to keep your pee (urine) clear or pale  yellow. Activity  Exercise only as told by your doctor. Stop exercising if you have cramps or pain in your lower belly (abdomen) or low back.  Do not exercise if it is too hot, too humid, or if you are in a place of great height (high altitude).  Try to avoid standing for long periods of time. Move your legs often if you must stand in one place for a long time.  Avoid heavy lifting.  Wear low-heeled shoes. Sit and stand up straight.  You can have sex unless your doctor tells you not to. Relieving  pain and discomfort  Wear a good support bra if your breasts are sore.  Take warm water baths (sitz baths) to soothe pain or discomfort caused by hemorrhoids. Use hemorrhoid cream if your doctor says it is okay.  Rest with your legs raised if you have leg cramps or low back pain.  If you have puffy, bulging veins (varicose veins) in your legs: ? Wear support hose or compression stockings as told by your doctor. ? Raise (elevate) your feet for 15 minutes, 3-4 times a day. ? Limit salt in your food. Prenatal care  Schedule your prenatal visits by the twelfth week of pregnancy.  Write down your questions. Take them to your prenatal visits.  Keep all your prenatal visits as told by your doctor. This is important. Safety  Wear your seat belt at all times when driving.  Make a list of emergency phone numbers. The list should include numbers for family, friends, the hospital, and police and fire departments. General instructions  Ask your doctor for a referral to a local prenatal class. Begin classes no later than at the start of month 6 of your pregnancy.  Ask for help if you need counseling or if you need help with nutrition. Your doctor can give you advice or tell you where to go for help.  Do not use hot tubs, steam rooms, or saunas.  Do not douche or use tampons or scented sanitary pads.  Do not cross your legs for long periods of time.  Avoid all herbs and alcohol. Avoid drugs that are not approved by your doctor.  Do not use any tobacco products, including cigarettes, chewing tobacco, and electronic cigarettes. If you need help quitting, ask your doctor. You may get counseling or other support to help you quit.  Avoid cat litter boxes and soil used by cats. These carry germs that can cause birth defects in the baby and can cause a loss of your baby (miscarriage) or stillbirth.  Visit your dentist. At home, brush your teeth with a soft toothbrush. Be gentle when you  floss. Contact a doctor if:  You are dizzy.  You have mild cramps or pressure in your lower belly.  You have a nagging pain in your belly area.  You continue to feel sick to your stomach, you throw up, or you have watery poop (diarrhea).  You have a bad smelling fluid coming from your vagina.  You have pain when you pee (urinate).  You have increased puffiness (swelling) in your face, hands, legs, or ankles. Get help right away if:  You have a fever.  You are leaking fluid from your vagina.  You have spotting or bleeding from your vagina.  You have very bad belly cramping or pain.  You gain or lose weight rapidly.  You throw up blood. It may look like coffee grounds.  You are around people who have [place] measles, fifth  disease, or chickenpox.  You have a very bad headache.  You have shortness of breath.  You have any kind of trauma, such as from a fall or a car accident. Summary  The first trimester of pregnancy is from week 1 until the end of week 13 (months 1 through 3).  To take care of yourself and your unborn baby, you will need to eat healthy meals, take medicines only if your doctor tells you to do so, and do activities that are safe for you and your baby.  Keep all follow-up visits as told by your doctor. This is important as your doctor will have to ensure that your baby is healthy and growing well. This information is not intended to replace advice given to you by your health care provider. Make sure you discuss any questions you have with your health care provider. Document Revised: 01/14/2019 Document Reviewed: 10/01/2016 Elsevier Patient Education  2020 [employer].

## 2019-12-30 LAB — GC/CHLAMYDIA PROBE AMP (~~LOC~~) NOT AT ARMC
Chlamydia: NEGATIVE
Comment: NEGATIVE
Comment: NORMAL
Neisseria Gonorrhea: NEGATIVE

## 2020-07-02 LAB — OB RESULTS CONSOLE GBS: GBS: POSITIVE

## 2020-08-10 ENCOUNTER — Inpatient Hospital Stay (HOSPITAL_COMMUNITY): Payer: Medicaid Other | Admitting: Anesthesiology

## 2020-08-10 ENCOUNTER — Other Ambulatory Visit: Payer: Self-pay

## 2020-08-10 ENCOUNTER — Inpatient Hospital Stay (HOSPITAL_COMMUNITY)
Admission: AD | Admit: 2020-08-10 | Discharge: 2020-08-12 | DRG: 806 | Disposition: A | Discharge status: Home / Self Care | Payer: Medicaid Other | Attending: Obstetrics and Gynecology | Admitting: Obstetrics and Gynecology

## 2020-08-10 ENCOUNTER — Encounter (HOSPITAL_COMMUNITY): Payer: Self-pay | Admitting: Obstetrics and Gynecology

## 2020-08-10 DIAGNOSIS — D62 Acute posthemorrhagic anemia: Secondary | ICD-10-CM | POA: Diagnosis not present

## 2020-08-10 DIAGNOSIS — Z20822 Contact with and (suspected) exposure to covid-19: Secondary | ICD-10-CM | POA: Diagnosis present

## 2020-08-10 DIAGNOSIS — O9902 Anemia complicating childbirth: Secondary | ICD-10-CM | POA: Diagnosis not present

## 2020-08-10 DIAGNOSIS — O9081 Anemia of the puerperium: Secondary | ICD-10-CM | POA: Diagnosis not present

## 2020-08-10 DIAGNOSIS — Z3A39 39 weeks gestation of pregnancy: Secondary | ICD-10-CM | POA: Diagnosis not present

## 2020-08-10 DIAGNOSIS — O99824 Streptococcus B carrier state complicating childbirth: Secondary | ICD-10-CM | POA: Diagnosis present

## 2020-08-10 LAB — CBC WITH DIFFERENTIAL/PLATELET
Abs Immature Granulocytes: 0.91 10*3/uL — ABNORMAL HIGH (ref 0.00–0.07)
Basophils Absolute: 0.1 10*3/uL (ref 0.0–0.1)
Basophils Relative: 0 %
Eosinophils Absolute: 0 10*3/uL (ref 0.0–0.5)
Eosinophils Relative: 0 %
HCT: 31.7 % — ABNORMAL LOW (ref 36.0–46.0)
Hemoglobin: 10.3 g/dL — ABNORMAL LOW (ref 12.0–15.0)
Immature Granulocytes: 6 %
Lymphocytes Relative: 11 %
Lymphs Abs: 1.7 10*3/uL (ref 0.7–4.0)
MCH: 32.3 pg (ref 26.0–34.0)
MCHC: 32.5 g/dL (ref 30.0–36.0)
MCV: 99.4 fL (ref 80.0–100.0)
Monocytes Absolute: 0.7 10*3/uL (ref 0.1–1.0)
Monocytes Relative: 5 %
Neutro Abs: 11.9 10*3/uL — ABNORMAL HIGH (ref 1.7–7.7)
Neutrophils Relative %: 78 %
Platelets: 114 10*3/uL — ABNORMAL LOW (ref 150–400)
RBC: 3.19 MIL/uL — ABNORMAL LOW (ref 3.87–5.11)
RDW: 13.9 % (ref 11.5–15.5)
WBC: 15.2 10*3/uL — ABNORMAL HIGH (ref 4.0–10.5)
nRBC: 0.2 % (ref 0.0–0.2)

## 2020-08-10 LAB — CBC
HCT: 38.4 % (ref 36.0–46.0)
Hemoglobin: 12.3 g/dL (ref 12.0–15.0)
MCH: 31.5 pg (ref 26.0–34.0)
MCHC: 32 g/dL (ref 30.0–36.0)
MCV: 98.5 fL (ref 80.0–100.0)
Platelets: 125 10*3/uL — ABNORMAL LOW (ref 150–400)
RBC: 3.9 MIL/uL (ref 3.87–5.11)
RDW: 13.9 % (ref 11.5–15.5)
WBC: 14.6 10*3/uL — ABNORMAL HIGH (ref 4.0–10.5)
nRBC: 0.2 % (ref 0.0–0.2)

## 2020-08-10 LAB — RESPIRATORY PANEL BY RT PCR (FLU A&B, COVID)
Influenza A by PCR: NEGATIVE
Influenza B by PCR: NEGATIVE
SARS Coronavirus 2 by RT PCR: NEGATIVE

## 2020-08-10 LAB — RPR: RPR Ser Ql: NONREACTIVE

## 2020-08-10 LAB — TYPE AND SCREEN
ABO/RH(D): B POS
Antibody Screen: NEGATIVE

## 2020-08-10 MED ORDER — LACTATED RINGERS IV SOLN
500.0000 mL | INTRAVENOUS | Status: DC | PRN
  Administered 2020-08-10: 1000 mL via INTRAVENOUS

## 2020-08-10 MED ORDER — MISOPROSTOL 200 MCG PO TABS
ORAL_TABLET | ORAL | Status: AC
  Filled 2020-08-10: qty 1

## 2020-08-10 MED ORDER — TRANEXAMIC ACID-NACL 1000-0.7 MG/100ML-% IV SOLN
INTRAVENOUS | Status: AC
  Filled 2020-08-10: qty 100

## 2020-08-10 MED ORDER — SODIUM CHLORIDE 0.9 % IV SOLN
3.0000 g | Freq: Four times a day (QID) | INTRAVENOUS | Status: AC
  Administered 2020-08-10 – 2020-08-11 (×3): 3 g via INTRAVENOUS
  Filled 2020-08-10 (×3): qty 8

## 2020-08-10 MED ORDER — SODIUM CHLORIDE (PF) 0.9 % IJ SOLN
INTRAMUSCULAR | Status: DC | PRN
  Administered 2020-08-10: 12 mL/h via EPIDURAL

## 2020-08-10 MED ORDER — METHYLERGONOVINE MALEATE 0.2 MG/ML IJ SOLN
INTRAMUSCULAR | Status: AC
  Filled 2020-08-10: qty 1

## 2020-08-10 MED ORDER — BUTORPHANOL TARTRATE 1 MG/ML IJ SOLN: 2.0000 mg | Freq: Once | INTRAMUSCULAR | Status: DC

## 2020-08-10 MED ORDER — LIDOCAINE-EPINEPHRINE (PF) 2 %-1:200000 IJ SOLN
INTRAMUSCULAR | Status: DC | PRN
  Administered 2020-08-10: 5 mL via EPIDURAL

## 2020-08-10 MED ORDER — MISOPROSTOL 25 MCG QUARTER TABLET: 25.0000 ug | ORAL_TABLET | Freq: Once | ORAL | Status: DC

## 2020-08-10 MED ORDER — BENZOCAINE-MENTHOL 20-0.5 % EX AERO
1.0000 "application " | INHALATION_SPRAY | CUTANEOUS | Status: DC | PRN
  Administered 2020-08-11: 1 via TOPICAL
  Filled 2020-08-10: qty 56

## 2020-08-10 MED ORDER — DIBUCAINE (PERIANAL) 1 % EX OINT: 1.0000 "application " | TOPICAL_OINTMENT | CUTANEOUS | Status: DC | PRN

## 2020-08-10 MED ORDER — PHENYLEPHRINE 40 MCG/ML (10ML) SYRINGE FOR IV PUSH (FOR BLOOD PRESSURE SUPPORT): 80.0000 ug | PREFILLED_SYRINGE | INTRAVENOUS | Status: DC | PRN

## 2020-08-10 MED ORDER — OXYTOCIN BOLUS FROM INFUSION
333.0000 mL | Freq: Once | INTRAVENOUS | Status: AC
  Administered 2020-08-10: 333 mL via INTRAVENOUS

## 2020-08-10 MED ORDER — LACTATED RINGERS IV SOLN: INTRAVENOUS | Status: DC

## 2020-08-10 MED ORDER — IBUPROFEN 600 MG PO TABS
600.0000 mg | ORAL_TABLET | Freq: Four times a day (QID) | ORAL | Status: DC
  Administered 2020-08-10 – 2020-08-12 (×7): 600 mg via ORAL
  Filled 2020-08-10 (×7): qty 1

## 2020-08-10 MED ORDER — ZOLPIDEM TARTRATE 5 MG PO TABS: 5.0000 mg | ORAL_TABLET | Freq: Every evening | ORAL | Status: DC | PRN

## 2020-08-10 MED ORDER — SOD CITRATE-CITRIC ACID 500-334 MG/5ML PO SOLN: 30.0000 mL | ORAL | Status: DC | PRN

## 2020-08-10 MED ORDER — LACTATED RINGERS IV SOLN: 500.0000 mL | Freq: Once | INTRAVENOUS | Status: DC

## 2020-08-10 MED ORDER — OXYTOCIN-SODIUM CHLORIDE 30-0.9 UT/500ML-% IV SOLN
2.5000 [IU]/h | INTRAVENOUS | Status: DC
  Filled 2020-08-10 (×2): qty 500

## 2020-08-10 MED ORDER — SIMETHICONE 80 MG PO CHEW
80.0000 mg | CHEWABLE_TABLET | ORAL | Status: DC | PRN
  Administered 2020-08-11: 80 mg via ORAL
  Filled 2020-08-10: qty 1

## 2020-08-10 MED ORDER — MISOPROSTOL 200 MCG PO TABS
ORAL_TABLET | ORAL | Status: AC
  Filled 2020-08-10: qty 4

## 2020-08-10 MED ORDER — ACETAMINOPHEN 325 MG PO TABS
650.0000 mg | ORAL_TABLET | ORAL | Status: DC | PRN
  Administered 2020-08-11 – 2020-08-12 (×3): 650 mg via ORAL
  Filled 2020-08-10 (×3): qty 2

## 2020-08-10 MED ORDER — OXYCODONE-ACETAMINOPHEN 5-325 MG PO TABS: 2.0000 | ORAL_TABLET | ORAL | Status: DC | PRN

## 2020-08-10 MED ORDER — PENICILLIN G POT IN DEXTROSE 60000 UNIT/ML IV SOLN
3.0000 10*6.[IU] | INTRAVENOUS | Status: DC
  Administered 2020-08-10: 3 10*6.[IU] via INTRAVENOUS
  Filled 2020-08-10: qty 50

## 2020-08-10 MED ORDER — ACETAMINOPHEN 500 MG PO TABS
1000.0000 mg | ORAL_TABLET | Freq: Once | ORAL | Status: AC
  Administered 2020-08-10: 1000 mg via ORAL
  Filled 2020-08-10: qty 2

## 2020-08-10 MED ORDER — LIDOCAINE HCL (PF) 1 % IJ SOLN: 30.0000 mL | INTRAMUSCULAR | Status: DC | PRN

## 2020-08-10 MED ORDER — ACETAMINOPHEN 325 MG PO TABS: 650.0000 mg | ORAL_TABLET | ORAL | Status: DC | PRN

## 2020-08-10 MED ORDER — MISOPROSTOL 200 MCG PO TABS
800.0000 ug | ORAL_TABLET | Freq: Once | ORAL | Status: AC
  Administered 2020-08-10: 800 ug via RECTAL

## 2020-08-10 MED ORDER — EPHEDRINE 5 MG/ML INJ: 10.0000 mg | INTRAVENOUS | Status: DC | PRN

## 2020-08-10 MED ORDER — COCONUT OIL OIL
1.0000 "application " | TOPICAL_OIL | Status: DC | PRN
  Administered 2020-08-12: 1 via TOPICAL

## 2020-08-10 MED ORDER — BUTORPHANOL TARTRATE 1 MG/ML IJ SOLN
INTRAMUSCULAR | Status: AC
  Administered 2020-08-10: 2 mg
  Filled 2020-08-10: qty 2

## 2020-08-10 MED ORDER — MEDROXYPROGESTERONE ACETATE 150 MG/ML IM SUSP: 150.0000 mg | INTRAMUSCULAR | Status: DC | PRN

## 2020-08-10 MED ORDER — FLEET ENEMA 7-19 GM/118ML RE ENEM: 1.0000 | ENEMA | RECTAL | Status: DC | PRN

## 2020-08-10 MED ORDER — OXYCODONE-ACETAMINOPHEN 5-325 MG PO TABS: 1.0000 | ORAL_TABLET | ORAL | Status: DC | PRN

## 2020-08-10 MED ORDER — SODIUM CHLORIDE 0.9 % IV SOLN
5.0000 10*6.[IU] | Freq: Once | INTRAVENOUS | Status: AC
  Administered 2020-08-10: 5 10*6.[IU] via INTRAVENOUS
  Filled 2020-08-10: qty 5

## 2020-08-10 MED ORDER — ONDANSETRON HCL 4 MG PO TABS: 4.0000 mg | ORAL_TABLET | ORAL | Status: DC | PRN

## 2020-08-10 MED ORDER — ONDANSETRON HCL 4 MG/2ML IJ SOLN: 4.0000 mg | INTRAMUSCULAR | Status: DC | PRN

## 2020-08-10 MED ORDER — TETANUS-DIPHTH-ACELL PERTUSSIS 5-2.5-18.5 LF-MCG/0.5 IM SUSY: 0.5000 mL | PREFILLED_SYRINGE | Freq: Once | INTRAMUSCULAR | Status: DC

## 2020-08-10 MED ORDER — EPHEDRINE 5 MG/ML INJ
10.0000 mg | INTRAVENOUS | Status: DC | PRN
  Administered 2020-08-10: 10 mg via INTRAVENOUS
  Filled 2020-08-10: qty 10

## 2020-08-10 MED ORDER — TERBUTALINE SULFATE 1 MG/ML IJ SOLN: 0.2500 mg | Freq: Once | INTRAMUSCULAR | Status: DC | PRN

## 2020-08-10 MED ORDER — FENTANYL-BUPIVACAINE-NACL 0.5-0.125-0.9 MG/250ML-% EP SOLN
EPIDURAL | Status: AC
  Filled 2020-08-10: qty 250

## 2020-08-10 MED ORDER — TRANEXAMIC ACID-NACL 1000-0.7 MG/100ML-% IV SOLN
1000.0000 mg | INTRAVENOUS | Status: AC
  Administered 2020-08-10: 1000 mg via INTRAVENOUS

## 2020-08-10 MED ORDER — OXYTOCIN-SODIUM CHLORIDE 30-0.9 UT/500ML-% IV SOLN
30.0000 [IU] | INTRAVENOUS | Status: DC
  Administered 2020-08-10: 30 [IU] via INTRAVENOUS

## 2020-08-10 MED ORDER — SENNOSIDES-DOCUSATE SODIUM 8.6-50 MG PO TABS
2.0000 | ORAL_TABLET | ORAL | Status: DC
  Administered 2020-08-11 (×2): 2 via ORAL
  Filled 2020-08-10 (×2): qty 2

## 2020-08-10 MED ORDER — MISOPROSTOL 200 MCG PO TABS
200.0000 ug | ORAL_TABLET | Freq: Once | ORAL | Status: AC
  Administered 2020-08-10: 200 ug via BUCCAL

## 2020-08-10 MED ORDER — OXYTOCIN-SODIUM CHLORIDE 30-0.9 UT/500ML-% IV SOLN
1.0000 m[IU]/min | INTRAVENOUS | Status: DC
  Administered 2020-08-10: 2 m[IU]/min via INTRAVENOUS

## 2020-08-10 MED ORDER — ONDANSETRON HCL 4 MG/2ML IJ SOLN: 4.0000 mg | Freq: Four times a day (QID) | INTRAMUSCULAR | Status: DC | PRN

## 2020-08-10 MED ORDER — DIPHENHYDRAMINE HCL 25 MG PO CAPS: 25.0000 mg | ORAL_CAPSULE | Freq: Four times a day (QID) | ORAL | Status: DC | PRN

## 2020-08-10 MED ORDER — PROMETHAZINE HCL 25 MG/ML IJ SOLN: 12.5000 mg | Freq: Once | INTRAMUSCULAR | Status: DC

## 2020-08-10 MED ORDER — WITCH HAZEL-GLYCERIN EX PADS
1.0000 "application " | MEDICATED_PAD | CUTANEOUS | Status: DC | PRN
  Administered 2020-08-11: 1 via TOPICAL

## 2020-08-10 MED ORDER — DIPHENHYDRAMINE HCL 50 MG/ML IJ SOLN: 12.5000 mg | INTRAMUSCULAR | Status: DC | PRN

## 2020-08-10 MED ORDER — METHYLERGONOVINE MALEATE 0.2 MG/ML IJ SOLN
0.2000 mg | Freq: Once | INTRAMUSCULAR | Status: AC
  Administered 2020-08-10: 0.2 mg via INTRAMUSCULAR

## 2020-08-10 MED ORDER — BUTORPHANOL TARTRATE 1 MG/ML IJ SOLN
2.0000 mg | Freq: Once | INTRAMUSCULAR | Status: AC
  Administered 2020-08-10: 2 mg via INTRAVENOUS
  Filled 2020-08-10: qty 2

## 2020-08-10 MED ORDER — FENTANYL-BUPIVACAINE-NACL 0.5-0.125-0.9 MG/250ML-% EP SOLN: 12.0000 mL/h | EPIDURAL | Status: DC | PRN

## 2020-08-10 MED ORDER — PRENATAL MULTIVITAMIN CH
1.0000 | ORAL_TABLET | Freq: Every day | ORAL | Status: DC
  Administered 2020-08-11: 1 via ORAL
  Filled 2020-08-10: qty 1

## 2020-08-10 NOTE — Progress Notes (Signed)
[name] is a 29 y.o. G2P1001 at [redacted]w[redacted]d Spont labor GBS pos, treated x 1  Subjective: Comfortable with epidural  Objective: BP (!) 111/55   Pulse 87   Temp 98.4 F (36.9 C)   Resp 20   LMP 11/11/2019  No intake/output data recorded. No intake/output data recorded.  FHT:  FHR: 150 bpm, variability: moderate,  accelerations:  Abscent,  decelerations:  Present prolonged decel s/p epidural placement. Requested for RN to give ephedrine. Good FHR recovery UC:   regular, every 3 minutes SVE:   Dilation: 7 Effacement (%): 90 Station: -2 Exam by:: J.Javae Braaten, CNM  Labs: Lab Results  Component Value Date   WBC 14.6 (H) 08/10/2020   HGB 12.3 08/10/2020   HCT 38.4 08/10/2020   MCV 98.5 08/10/2020   PLT 125 (L) 08/10/2020    Assessment / Plan: Spontaneous labor, progressing normally  Labor: AROM complete. Thick mec. Progressing normally Preeclampsia:  no signs or symptoms of toxicity Fetal Wellbeing:  Category II Pain Control:  Epidural I/D:  GBS pos, treated x 1 Anticipated MOD:  NSVD  [name] 08/10/2020, 12:16 PM

## 2020-08-10 NOTE — Anesthesia Preprocedure Evaluation (Signed)

## 2020-08-10 NOTE — Progress Notes (Signed)
Responded to Neonatal Code Blue.  After baby transferred to NICU I spoke to staff about supporting parents.  It was not a good time for that.  I checked in later in the NICU and they were in the midst of a sterile procedure.  We will follow up with family tomorrow, but if any needs arise, please page the on-call chaplain at [phone]  [facility], Bcc Pager, [phone] 5:00 PM

## 2020-08-10 NOTE — H&P (Signed)
[name] is a 29 y.o. female active labor Denies SROM or VB 39.0 weeks Desires epidural  OB History    Gravida  2   Para  1   Term  1   Preterm      AB      Living  1     SAB      TAB      Ectopic      Multiple  0   Live Births  1          Past Medical History:  Diagnosis Date  . Anemia    Past Surgical History:  Procedure Laterality Date  . NO PAST SURGERIES     Family History: family history is not on file. Social History:  reports that she has never smoked. She has never used smokeless tobacco. She reports that she does not drink alcohol and does not use drugs.     Maternal Diabetes: No Genetic Screening: Normal Maternal Ultrasounds/Referrals: Normal Fetal Ultrasounds or other Referrals:  None Maternal Substance Abuse:  No Significant Maternal Medications:  None Significant Maternal Lab Results:  Group B Strep positive Other Comments:  None  Review of Systems  Constitutional: Negative.   HENT: Negative.   Eyes: Negative.   Respiratory: Negative.   Cardiovascular: Negative.   Gastrointestinal: Negative.   Endocrine: Negative.   Genitourinary: Negative.   Musculoskeletal: Negative.   Skin: Negative.   Allergic/Immunologic: Negative.   Neurological: Negative.   Hematological: Negative.   Psychiatric/Behavioral: Negative.    History Dilation: 5 Effacement (%): 80 Station: -2 Exam by:: [name] Blood pressure (!) 121/53, pulse (!) 115, temperature 98.4 F (36.9 C), resp. rate 20, last menstrual period 11/11/2019, unknown if currently breastfeeding. Exam Physical Exam Vitals reviewed. Exam conducted with a chaperone present.  Constitutional:      Appearance: Normal appearance.  HENT:     Head: Normocephalic and atraumatic.     Nose: Nose normal.     Mouth/Throat:     Mouth: Mucous membranes are moist.     Pharynx: Oropharynx is clear.  Eyes:     Extraocular Movements: Extraocular movements intact.      Conjunctiva/sclera: Conjunctivae normal.     Pupils: Pupils are equal, round, and reactive to light.  Cardiovascular:     Rate and Rhythm: Normal rate and regular rhythm.     Pulses: Normal pulses.     Heart sounds: Normal heart sounds.  Pulmonary:     Effort: Pulmonary effort is normal.     Breath sounds: Normal breath sounds.  Abdominal:     Palpations: Abdomen is soft.  Musculoskeletal:        General: Normal range of motion.     Cervical back: Normal range of motion and neck supple.  Skin:    General: Skin is warm.  Neurological:     General: No focal deficit present.     Mental Status: She is alert and oriented to person, place, and time.  Psychiatric:        Mood and Affect: Mood normal.        Behavior: Behavior normal.        Thought Content: Thought content normal.        Judgment: Judgment normal.    Bedside US confirms vertex Cat I tracing  Prenatal labs: ABO, Rh:  Bpos Antibody:  neg Rubella:  immune RPR:   neg HBsAg:   neg HIV: NON REACTIVE (03/24 1518)  GBS:   pos  Assessment/Plan: Admit for labor Routine LDR orders Begin PCN for GBS prophylaxis Epidural as desires Consider pitocin augmentation PRN [name] updated on pts admission and plan of care discussed   [name] 08/10/2020, 9:22 AM

## 2020-08-10 NOTE — Anesthesia Procedure Notes (Signed)
Epidural Patient location during procedure: OB Start time: 08/10/2020 10:05 AM End time: 08/10/2020 10:15 AM  Staffing Anesthesiologist: Elmer Picker, MD Performed: anesthesiologist   Preanesthetic Checklist Completed: patient identified, IV checked, risks and benefits discussed, monitors and equipment checked, pre-op evaluation and timeout performed  Epidural Patient position: sitting Prep: DuraPrep and site prepped and draped Patient monitoring: continuous pulse ox, blood pressure, heart rate and cardiac monitor Approach: midline Location: L3-L4 Injection technique: LOR air  Needle:  Needle type: Tuohy  Needle gauge: 17 G Needle length: 9 cm Needle insertion depth: 4 cm Catheter type: closed end flexible Catheter size: 19 Gauge Catheter at skin depth: 10 cm Test dose: negative  Assessment Sensory level: T8 Events: blood not aspirated, injection not painful, no injection resistance, no paresthesia and negative IV test  Additional Notes Patient identified. Risks/Benefits/Options discussed with patient including but not limited to bleeding, infection, nerve damage, paralysis, failed block, incomplete pain control, headache, blood pressure changes, nausea, vomiting, reactions to medication both or allergic, itching and postpartum back pain. Confirmed with bedside nurse the patient's most recent platelet count. Confirmed with patient that they are not currently taking any anticoagulation, have any bleeding history or any family history of bleeding disorders. Patient expressed understanding and wished to proceed. All questions were answered. Sterile technique was used throughout the entire procedure. Please see nursing notes for vital signs. Test dose was given through epidural catheter and negative prior to continuing to dose epidural or start infusion. Warning signs of high block given to the patient including shortness of breath, tingling/numbness in hands, complete motor block,  or any concerning symptoms with instructions to call for help. Patient was given instructions on fall risk and not to get out of bed. All questions and concerns addressed with instructions to call with any issues or inadequate analgesia.  Reason for block:procedure for pain

## 2020-08-10 NOTE — MAU Note (Signed)
CTX 3-5 apart.  Denies LOF.  Some bloody show.  Endorses + FM.

## 2020-08-10 NOTE — Lactation Note (Signed)
This note was copied from a baby's chart. Lactation Consultation Note  Patient Name: [name] [id number] Date: 08/10/2020  [facility] had spoken with RN, Jeanice Lim who had reported earlier it was fine to see mom.  However on arrival, [place] let [facility] know that they were talking to mom from the NICU and that her baby is being transferred to a Level 4 NICU.  Holly, RN reported right now is not a good time to talk with her. Will follow up with mom tomorrow     Maternal Data    Feeding    [facility] Score                   Interventions    Lactation Tools Discussed/Used     Consult Status      [name] 08/10/2020, 11:44 PM

## 2020-08-11 LAB — CBC
HCT: 30.9 % — ABNORMAL LOW (ref 36.0–46.0)
Hemoglobin: 9.9 g/dL — ABNORMAL LOW (ref 12.0–15.0)
MCH: 31.5 pg (ref 26.0–34.0)
MCHC: 32 g/dL (ref 30.0–36.0)
MCV: 98.4 fL (ref 80.0–100.0)
Platelets: 109 10*3/uL — ABNORMAL LOW (ref 150–400)
RBC: 3.14 MIL/uL — ABNORMAL LOW (ref 3.87–5.11)
RDW: 14 % (ref 11.5–15.5)
WBC: 16.2 10*3/uL — ABNORMAL HIGH (ref 4.0–10.5)
nRBC: 0 % (ref 0.0–0.2)

## 2020-08-11 MED ORDER — SODIUM CHLORIDE 0.9 % IV SOLN
INTRAVENOUS | Status: DC | PRN
  Administered 2020-08-11: 250 mL via INTRAVENOUS

## 2020-08-11 NOTE — Lactation Note (Signed)
Lactation Consultation Note  Patient Name: [name] [id number] Date: 08/11/2020 Reason for consult: Initial assessment;NICU baby;Term  LC in to visit with P2 Mom of term baby, 17 hrs after delivery.  Neonatal Code Blue and transferred to [facility] due to severity of baby's condition following delivery.    Mom has declined starting to pump due to her not feeling well enough.  LC set up DEBP and Mom stated that she would start pumping after breakfast.  Mom agreeable for LC to demonstrate breast massage and hand expression.  Unable to express drops currently.  Colostrum containers provided.    Initiated pumping to assess proper flange size.  24 mm flanges appear to be correct size.   Mom encouraged to pump both breasts on initiation setting every 2-3 hrs during the day and 3-4 hrs at night, along with breast massage and hand expression.    Instructed Mom and FOB to disassemble pump parts, wash, rinse and air dry in separate bin.  Mom currently does not have a pump at home, and does not have WIC, but would like to apply.  Faxed WIC referral   Mom given lactation brochure and phone numbers pointed out to Mom for after discharge.  Mom denies any questions currently.  Interventions Interventions: Breast feeding basics reviewed;Breast massage;Hand express;DEBP;Hand pump  Lactation Tools Discussed/Used Tools: Pump;Flanges Flange Size: 24 Breast pump type: Double-Electric Breast Pump WIC Program: No (would like to sign up, faxed referral) Pump Review: Setup, frequency, and cleaning;Milk Storage Initiated by:: Erby Pian RN IBCLC Date initiated:: 08/11/20   Consult Status Consult Status: Follow-up Date: 08/12/20 Follow-up type: In-patient    Judee Clara 08/11/2020, 9:35 AM

## 2020-08-11 NOTE — Plan of Care (Signed)
Helped patient with Breast pump, discussed frequency and length of each pumping session. Reviewed pump part cleaning.

## 2020-08-11 NOTE — Progress Notes (Signed)
I offered support to pt without the use of interpreter because pt was able to speak with me.  She did not want to share much, but definitely indicated that she has a heavy heart that she is separated from her daughter when her daughter is going through so much.  They have an older daughter who is 4 and they are balancing caring for her while also worrying about their baby.  She was able to speak with her family in [place] briefly and they have a few friends here who have been a support as well.  Chaplain Dyanne Carrel, Bcc Pager, [phone] 2:15 PM

## 2020-08-11 NOTE — Progress Notes (Signed)
With help of video interpreter "Ronnie"#[id number], reviewed plan of care with patient and her husband.  IV antibiotics, pain management, meals, alternating rest and ambulation were all discussed.  Pt aware of how to call [facility] hospital for update on her baby.  Pt unsure if she wants discharge today to go see baby.

## 2020-08-11 NOTE — Progress Notes (Addendum)
PPD# 1 SVD w/ 1st degree  Information for the patient's newborn:  Marlet, Korte [[id number]]  female    Infant transferred to [facility] for ECMO  S:   Reports feeling sore and tired. C/O rectal pressure and gas pain.  Tolerating PO fluid and solids No nausea or vomiting Bleeding is light, no clots Pain controlled with PO meds, simethicone, k-pad Up ad lib / ambulatory / voiding w/o difficulty Feeding: infant has been transferred to [facility], pt not pumping    O:   VS: BP 96/67 (BP Location: Right Arm)   Pulse 91   Temp 97.8 F (36.6 C) (Oral)   Resp 17   LMP 11/11/2019   SpO2 98%   LABS:  Recent Labs    08/10/20 2119 08/11/20 0539  WBC 15.2* 16.2*  HGB 10.3* 9.9*  PLT 114* 109*   Blood type: --/--/B POS (11/04 0905) Rubella:                        I&O: Intake/Output      11/04 0701 - 11/05 0700 11/05 0701 - 11/06 0700   P.O. 240    Total Intake 240    Urine 1600 300   Blood 457    Total Output 2057 300   Net -1817 -300          Physical Exam: Alert and oriented X3 Lungs: Clear and unlabored Heart: regular rate and rhythm / no mumurs Abdomen: soft, non-tender, non-distended  Fundus: firm, non-tender Perineum: well-approximated, non-edematous Lochia: appropriated Extremities: no edema, no calf pain, tenderness, no cords    A:  PPD # 1  Normal exam  P:  Routine post partum orders Anticipate D/C on 08/11/20, will consider early discharge if pt wants to go to [facility] to be with infant   Plan reviewed w/ Dr. Tresa Moore, MSN, CNM 08/11/2020, 10:59 AM

## 2020-08-11 NOTE — Anesthesia Postprocedure Evaluation (Signed)
Anesthesia Post Note  Patient: [name]  Procedure(s) Performed: AN AD HOC LABOR EPIDURAL     Patient location during evaluation: Mother Baby Anesthesia Type: Epidural Level of consciousness: awake and alert and oriented Pain management: satisfactory to patient Vital Signs Assessment: post-procedure vital signs reviewed and stable Respiratory status: respiratory function stable Cardiovascular status: stable Postop Assessment: no headache, no backache, epidural receding, patient able to bend at knees, no signs of nausea or vomiting, adequate PO intake and able to ambulate Anesthetic complications: no   No complications documented.  Last Vitals:  Vitals:   08/11/20 0005 08/11/20 0526  BP: 96/65 96/67  Pulse: (!) 109 91  Resp: 18 17  Temp: 36.7 C 36.6 C  SpO2: 96% 98%    Last Pain:  Vitals:   08/11/20 0526  TempSrc: Oral  PainSc: 0-No pain   Pain Goal:                   [name]

## 2020-08-12 DIAGNOSIS — O9902 Anemia complicating childbirth: Secondary | ICD-10-CM | POA: Diagnosis not present

## 2020-08-12 MED ORDER — POLYSACCHARIDE IRON COMPLEX 150 MG PO CAPS: 150.0000 mg | ORAL_CAPSULE | Freq: Every day | ORAL | Status: DC

## 2020-08-12 MED ORDER — IBUPROFEN 600 MG PO TABS
600.0000 mg | ORAL_TABLET | Freq: Four times a day (QID) | ORAL | 0 refills | Status: DC
Start: 2020-08-12 — End: 2021-05-17

## 2020-08-12 MED ORDER — ACETAMINOPHEN 500 MG PO TABS: 1000.0000 mg | ORAL_TABLET | Freq: Four times a day (QID) | ORAL | 2 refills | Status: AC | PRN

## 2020-08-12 MED ORDER — COCONUT OIL OIL
1.0000 | TOPICAL_OIL | 0 refills | Status: DC | PRN
Start: 2020-08-12 — End: 2021-05-17

## 2020-08-12 MED ORDER — BENZOCAINE-MENTHOL 20-0.5 % EX AERO: 1.0000 "application " | INHALATION_SPRAY | CUTANEOUS | 1 refills | Status: DC | PRN

## 2020-08-12 MED ORDER — PRENATAL MULTIVITAMIN CH
1.0000 | ORAL_TABLET | Freq: Every day | ORAL | Status: DC
Start: 2020-08-12 — End: 2021-05-17

## 2020-08-12 MED ORDER — MAGNESIUM OXIDE -MG SUPPLEMENT 400 (240 MG) MG PO TABS: 400.0000 mg | ORAL_TABLET | Freq: Every day | ORAL | Status: DC

## 2020-08-12 NOTE — Progress Notes (Signed)
Pt discharged after discharge instructions given using Arabic interpreter. All questions answered. IV discontinued. Pt in stable condition and sent home with all belongings.

## 2020-08-12 NOTE — Progress Notes (Signed)
Breakfast order placed for patient at patient's request.

## 2020-08-12 NOTE — Discharge Summary (Signed)
OB Discharge Summary  Patient Name: [name] DOB: 11-05-1990 MRN: [id number]  Date of admission: 08/10/2020 Delivering provider: CRUMPLER, JENNIFER B   Admitting diagnosis: Indication for care in labor or delivery [O75.9] Intrauterine pregnancy: [redacted]w[redacted]d     Secondary diagnosis: Patient Active Problem List   Diagnosis Date Noted  . Maternal anemia, with delivery - ABL 08/12/2020  . SVD (11/4) 08/11/2020  . Indication for care in labor or delivery 08/10/2020   Additional problems:none   Date of discharge: 08/12/2020   Discharge diagnosis: Active Problems:   Indication for care in labor or delivery   SVD (11/4)   Maternal anemia, with delivery - ABL                                                              Post partum procedures:none  Augmentation: AROM Pain control: Epidural  Laceration:1st degree  Episiotomy:None  Complications: APGAR 1 at 5 min of life, heavy meconium  Hospital course:  Onset of Labor With Vaginal Delivery      29 y.o. yo G2P1001 at [redacted]w[redacted]d was admitted in Active Labor on 08/10/2020. Patient had an uncomplicated labor course as follows:  Membrane Rupture Time/Date: 11:39 AM ,08/10/2020   Delivery Method:Vaginal, Spontaneous  Episiotomy: None  Lacerations:  1st degree  Patient had an uncomplicated postpartum course.  She is ambulating, tolerating a regular diet, passing flatus, and urinating well. Patient is discharged home in stable condition on 08/12/20.  Newborn Data: Birth date:08/10/2020  Birth time:3:02 PM  Gender:Female  Living status:Living  Apgars:8 ,1  Weight:3200 g   Physical exam  Vitals:   08/11/20 1948 08/12/20 0007 08/12/20 0412 08/12/20 0740  BP: 108/69 108/68 105/68 100/63  Pulse: 85 71 81 62  Resp: 16 18 18 18   Temp: 98 F (36.7 C) 98.1 F (36.7 C) 98.1 F (36.7 C) 98.1 F (36.7 C)  TempSrc: Oral Oral Oral Oral  SpO2: 99% 100% 100% 97%   General: alert, cooperative and no distress Lochia: appropriate Uterine Fundus:  firm Incision: N/A Perineum: repair intact, no edema DVT Evaluation: No cords or calf tenderness. No significant calf/ankle edema. Labs: Lab Results  Component Value Date   WBC 16.2 (H) 08/11/2020   HGB 9.9 (L) 08/11/2020   HCT 30.9 (L) 08/11/2020   MCV 98.4 08/11/2020   PLT 109 (L) 08/11/2020   No flowsheet data found. Edinburgh Postnatal Depression Scale Screening Tool 08/11/2020  I have been able to laugh and see the funny side of things. 0  I have looked forward with enjoyment to things. 1  I have blamed myself unnecessarily when things went wrong. 0  I have been anxious or worried for no good reason. 2  I have felt scared or panicky for no good reason. 1  Things have been getting on top of me. 1  I have been so unhappy that I have had difficulty sleeping. 1  I have felt sad or miserable. 0  I have been so unhappy that I have been crying. 0  The thought of harming myself has occurred to me. 0  Edinburgh Postnatal Depression Scale Total 6    Discharge instruction:  per After Visit Summary,  "Understanding Mother & Baby Care" hospital booklet  After Visit Meds:  Allergies as of 08/12/2020   No  Known Allergies     Medication List    TAKE these medications   acetaminophen 500 MG tablet Commonly known as: TYLENOL Take 2 tablets (1,000 mg total) by mouth every 6 (six) hours as needed.   benzocaine-Menthol 20-0.5 % Aero Commonly known as: DERMOPLAST Apply 1 application topically as needed for irritation (perineal discomfort).   coconut oil Oil Apply 1 application topically as needed.   ibuprofen 600 MG tablet Commonly known as: ADVIL Take 1 tablet (600 mg total) by mouth every 6 (six) hours.   iron polysaccharides 150 MG capsule Commonly known as: Ferrex 150 Take 1 capsule (150 mg total) by mouth daily.   Magnesium Oxide 400 (240 Mg) MG Tabs Take 1 tablet (400 mg total) by mouth daily. For prevention of constipation.   prenatal multivitamin Tabs tablet Take  1 tablet by mouth daily at 12 noon.            Discharge Care Instructions  (From admission, onward)         Start     Ordered   08/12/20 0000  Discharge wound care:       Comments: Sitz baths 2 times /day with warm water x 1 week. May add herbals: 1 ounce dried comfrey leaf* 1 ounce calendula flowers 1 ounce lavender flowers  Supplies can be found online at [employer] sources at [employer], [employer]  1/2 ounce dried uva ursi leaves 1/2 ounce witch hazel blossoms (if you can find them) 1/2 ounce dried sage leaf 1/2 cup sea salt Directions: Bring 2 quarts of water to a boil. Turn off heat, and place 1 ounce (approximately 1 large handful) of the above mixed herbs (not the salt) into the pot. Steep, covered, for 30 minutes.  Strain the liquid well with a fine mesh strainer, and discard the herb material. Add 2 quarts of liquid to the tub, along with the 1/2 cup of salt. This medicinal liquid can also be made into compresses and peri-rinses.   08/12/20 0956          Diet: iron rich diet  Activity: Advance as tolerated. Pelvic rest for 6 weeks.   Postpartum contraception: Nexplanon, desires at 2 weeks PP  Newborn Data: Live born female  Birth Weight: 7 lb 0.9 oz (3200 g) APGAR: 8, 1  Newborn Delivery   Birth date/time: 08/10/2020 15:02:00 Delivery type: Vaginal, Spontaneous      named Jwan Baby Feeding: pumping breast milk Disposition:transferred to [facility] for ECMO treatment, post severe peconium aspiration   Delivery Report:  Review the Delivery Report for details.    Follow up:  Follow-up Information    [facility]. Schedule an appointment as soon as possible for a visit in 2 week(s).   Specialty: Obstetrics and Gynecology Why: Postpartum visit Contact information: [address] [place] [address] [phone]                Signed: Cipriano Mile, MSN 08/12/2020, 10:00 AM

## 2020-08-12 NOTE — Lactation Note (Signed)
Lactation Consultation Note  Patient Name: [name] GGYIR'S Date: 08/12/2020 Reason for consult: Follow-up assessment;Other (Comment) (Infant at [facility] level 4 NICU)   Mom resting when LC entered room.  Clinical research associate used.  Mom pumped last night using the DEBP and mom plans to pump today after breakfast.  Mom had pain with the pumping and tells the [facility] her nipple is sore.    Mom BF her 29 yr old and desires to pump  And provide milk for infant in PICU/ [facility].   Mom agreed to pump now in order to check flange fit.  24 had large amount of space around nipple and areola was pulling so mom was fitted with a 21 on both sides and she said they felt better.  LC explained what proper and desired fit should fit/feel like and encouraged mom to use coconut oil prior to pumping for added comfort.  Belly band made for hands free bra.  Mom began cramping during pumping.    Mom understands to take pump parts home with her.  WIC is supposed to call family Monday according to dad.  Mom was provided two hand pumps and was encouraged by [facility] to pump at [facility] when visiting the baby today and tomorrow.   She is able to hand express and was educated on hands on pumping.  Storage of EBM discussed and LC encouraged mom to pump every 2-3 hours with a goal of at least 8 pumps in 24 hours; allowing a four hour stretch at night for sleep.  All questions answered and parents were given resources for lactation support.     Maternal Data    Feeding    LATCH Score                   Interventions Interventions: DEBP  Lactation Tools Discussed/Used Tools: Pump WIC Program: No   Consult Status Consult Status: Complete Date: 08/12/20 Follow-up type: In-patient    [name] [facility] 08/12/2020, 9:48 AM

## 2020-08-14 LAB — SURGICAL PATHOLOGY

## 2020-09-01 ENCOUNTER — Observation Stay (HOSPITAL_COMMUNITY)
Admission: AD | Admit: 2020-09-01 | Discharge: 2020-09-02 | Disposition: A | Discharge status: Home / Self Care | Payer: Medicaid Other | Attending: Obstetrics & Gynecology | Admitting: Obstetrics & Gynecology

## 2020-09-01 ENCOUNTER — Encounter (HOSPITAL_COMMUNITY): Payer: Self-pay | Admitting: Obstetrics & Gynecology

## 2020-09-01 ENCOUNTER — Ambulatory Visit (HOSPITAL_COMMUNITY)
Admission: EM | Admit: 2020-09-01 | Discharge: 2020-09-01 | Disposition: A | Discharge status: Nursing Home (Includes ICF) | Payer: Medicaid Other

## 2020-09-01 ENCOUNTER — Inpatient Hospital Stay (HOSPITAL_COMMUNITY): Payer: Medicaid Other

## 2020-09-01 ENCOUNTER — Other Ambulatory Visit: Payer: Self-pay

## 2020-09-01 DIAGNOSIS — N719 Inflammatory disease of uterus, unspecified: Secondary | ICD-10-CM | POA: Diagnosis present

## 2020-09-01 DIAGNOSIS — R509 Fever, unspecified: Secondary | ICD-10-CM | POA: Diagnosis present

## 2020-09-01 DIAGNOSIS — Z20822 Contact with and (suspected) exposure to covid-19: Secondary | ICD-10-CM | POA: Diagnosis not present

## 2020-09-01 DIAGNOSIS — O8612 Endometritis following delivery: Secondary | ICD-10-CM | POA: Diagnosis not present

## 2020-09-01 DIAGNOSIS — B9689 Other specified bacterial agents as the cause of diseases classified elsewhere: Secondary | ICD-10-CM | POA: Insufficient documentation

## 2020-09-01 DIAGNOSIS — Z9889 Other specified postprocedural states: Secondary | ICD-10-CM

## 2020-09-01 LAB — CBC WITH DIFFERENTIAL/PLATELET
Abs Immature Granulocytes: 0.02 10*3/uL (ref 0.00–0.07)
Basophils Absolute: 0 10*3/uL (ref 0.0–0.1)
Basophils Relative: 0 %
Eosinophils Absolute: 0 10*3/uL (ref 0.0–0.5)
Eosinophils Relative: 0 %
HCT: 38.8 % (ref 36.0–46.0)
Hemoglobin: 12.3 g/dL (ref 12.0–15.0)
Immature Granulocytes: 0 %
Lymphocytes Relative: 16 %
Lymphs Abs: 0.9 10*3/uL (ref 0.7–4.0)
MCH: 31 pg (ref 26.0–34.0)
MCHC: 31.7 g/dL (ref 30.0–36.0)
MCV: 97.7 fL (ref 80.0–100.0)
Monocytes Absolute: 0.4 10*3/uL (ref 0.1–1.0)
Monocytes Relative: 6 %
Neutro Abs: 4.3 10*3/uL (ref 1.7–7.7)
Neutrophils Relative %: 78 %
Platelets: 169 10*3/uL (ref 150–400)
RBC: 3.97 MIL/uL (ref 3.87–5.11)
RDW: 12.7 % (ref 11.5–15.5)
WBC: 5.5 10*3/uL (ref 4.0–10.5)
nRBC: 0 % (ref 0.0–0.2)

## 2020-09-01 LAB — URINALYSIS, ROUTINE W REFLEX MICROSCOPIC
Bilirubin Urine: NEGATIVE
Glucose, UA: NEGATIVE mg/dL
Ketones, ur: 20 mg/dL — AB
Nitrite: NEGATIVE
Protein, ur: NEGATIVE mg/dL
Specific Gravity, Urine: 1.002 — ABNORMAL LOW (ref 1.005–1.030)
pH: 6 (ref 5.0–8.0)

## 2020-09-01 LAB — RESP PANEL BY RT-PCR (FLU A&B, COVID) ARPGX2
Influenza A by PCR: NEGATIVE
Influenza B by PCR: NEGATIVE
SARS Coronavirus 2 by RT PCR: NEGATIVE

## 2020-09-01 LAB — TYPE AND SCREEN
ABO/RH(D): B POS
Antibody Screen: NEGATIVE

## 2020-09-01 IMAGING — US US PELVIS COMPLETE
1 series · 14 of 25 positions shown · non-contrast
Comparison: None
COMPARISON: None

Addendum:
CLINICAL DATA: Heavy vaginal bleeding. Status post vaginal delivery
3 weeks ago sent to rule out retained products of conception.

EXAM:
TRANSABDOMINAL AND TRANSVAGINAL ULTRASOUND OF PELVIS
TECHNIQUE: Both transabdominal and transvaginal ultrasound examinations of the
pelvis were performed. Transabdominal technique was performed for
global imaging of the pelvis including uterus, ovaries, adnexal
regions, and pelvic cul-de-sac. It was necessary to proceed with
endovaginal exam following the transabdominal exam to visualize the
uterus, endometrium, bilateral ovaries and bilateral adnexa.
TECHNIQUE: Transabdominal ultrasound examination of the pelvis was
performed. Please note that transvaginal ultrasound was not
performed as stated in the initial report.
*** End of Addendum ***

[Series 1: us pelvis complete · 14 of 60 slices shown]
[im 1/60]
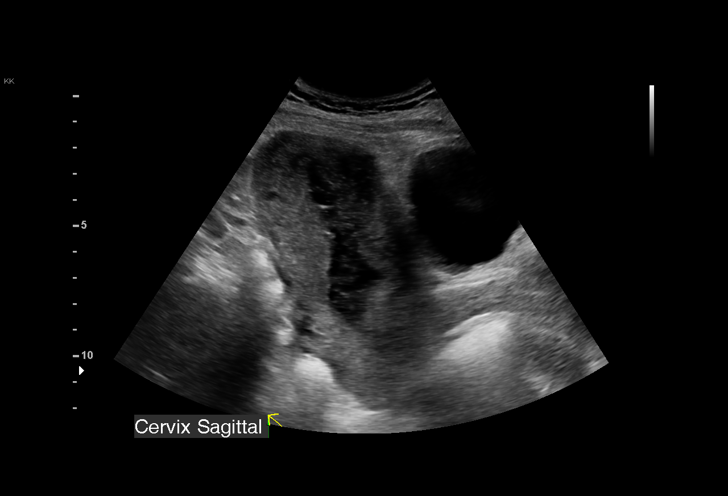
[im 5/60]
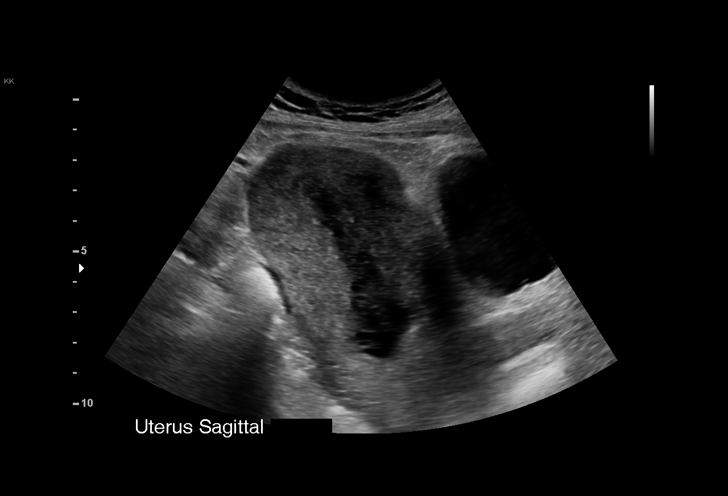
[im 10/60]
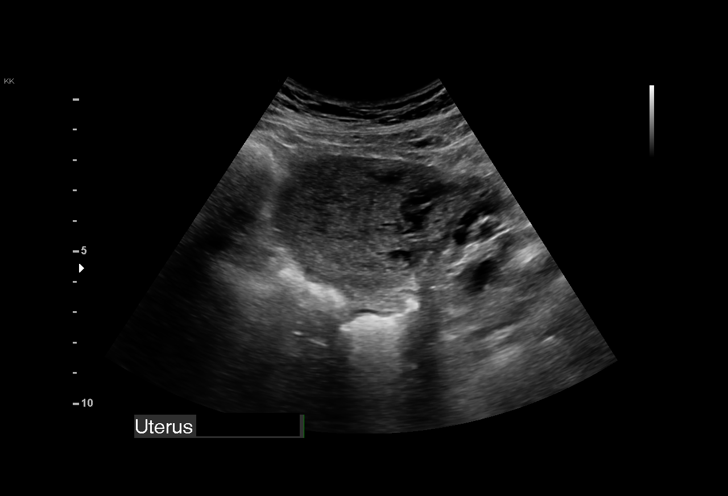
[im 15/60]
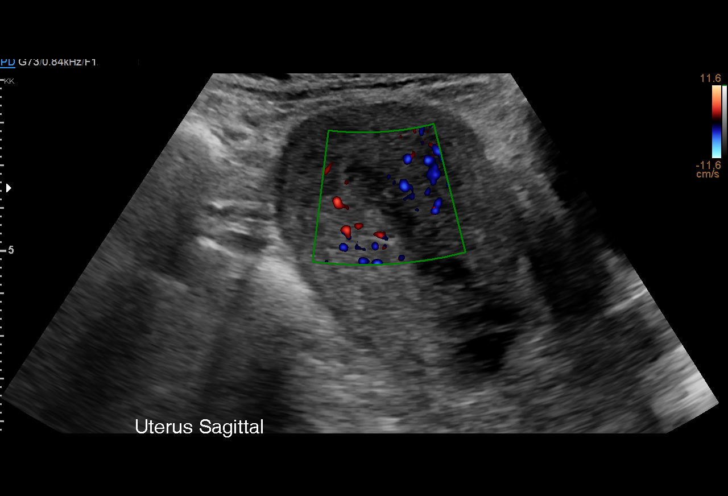
[im 20/60]
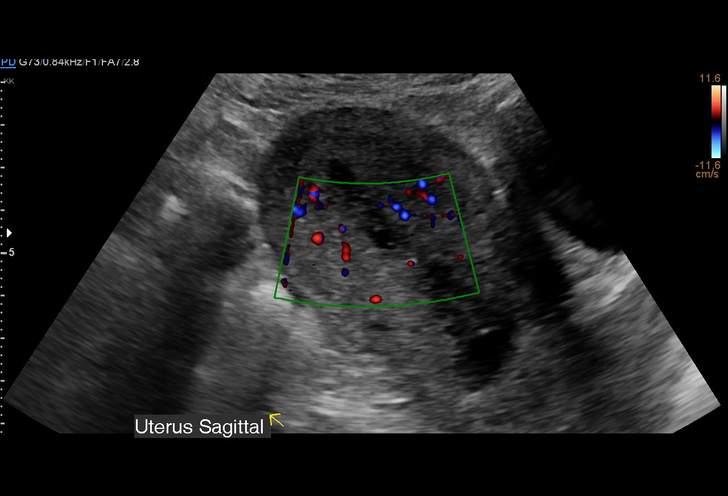
[im 23/60]
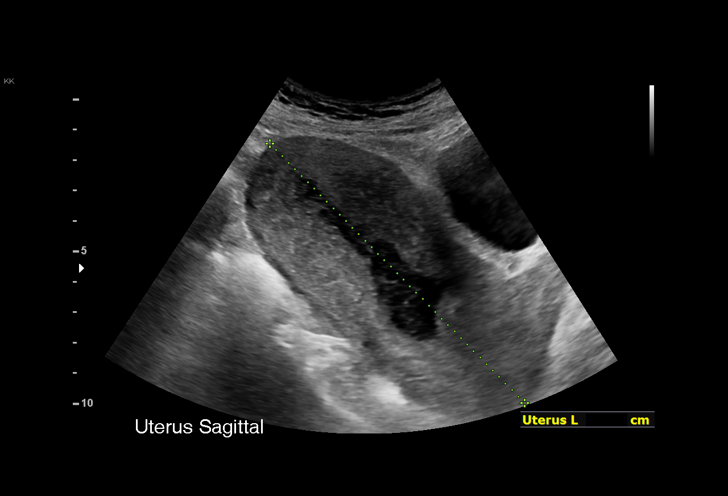
[im 28/60]
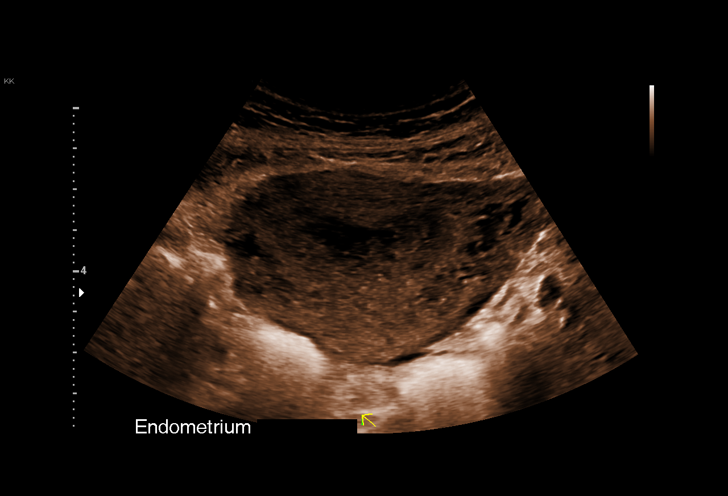
[im 32/60]
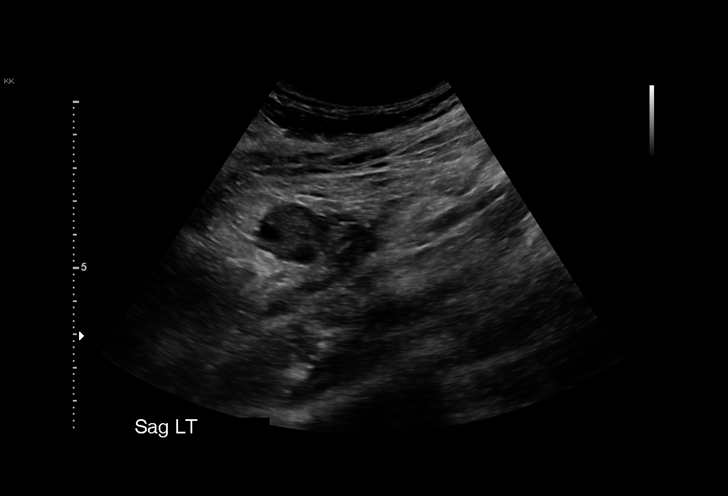
[im 37/60]
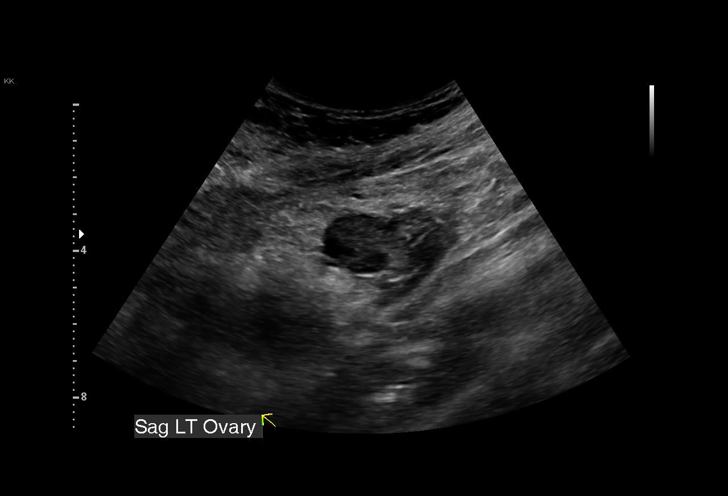
[im 40/60]
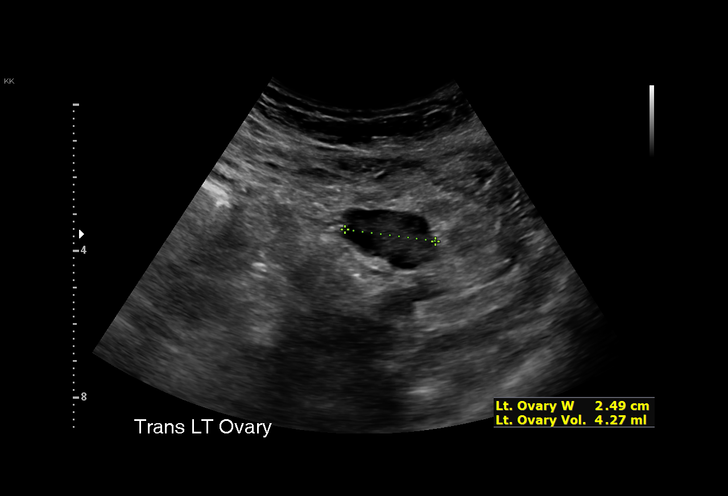
[im 45/60]
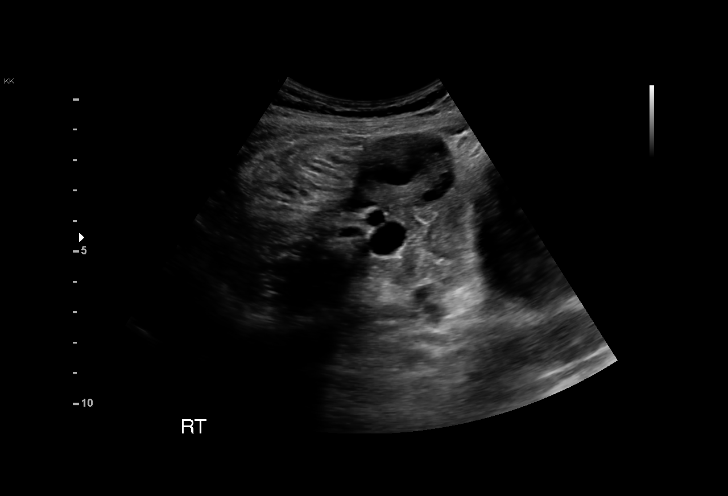
[im 50/60]
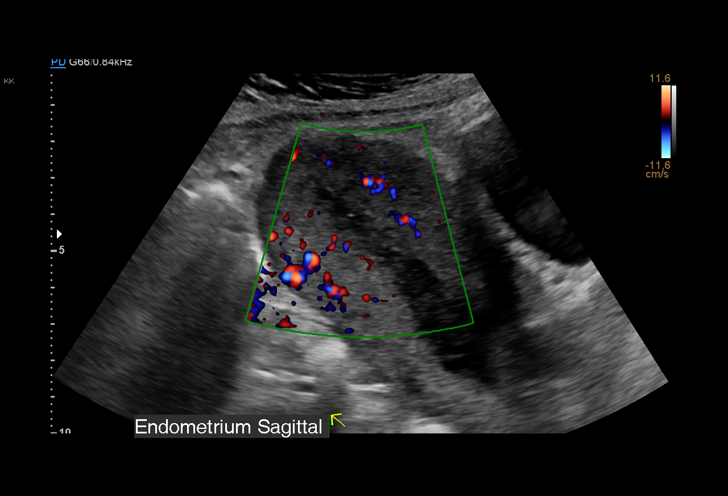
[im 55/60]
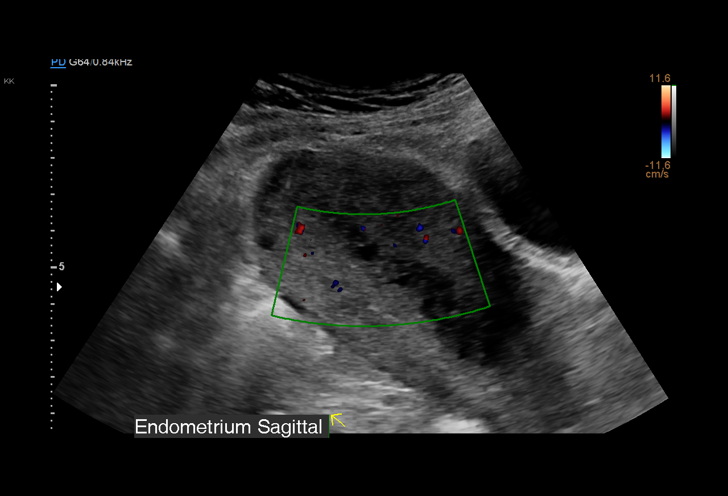
[im 60/60]
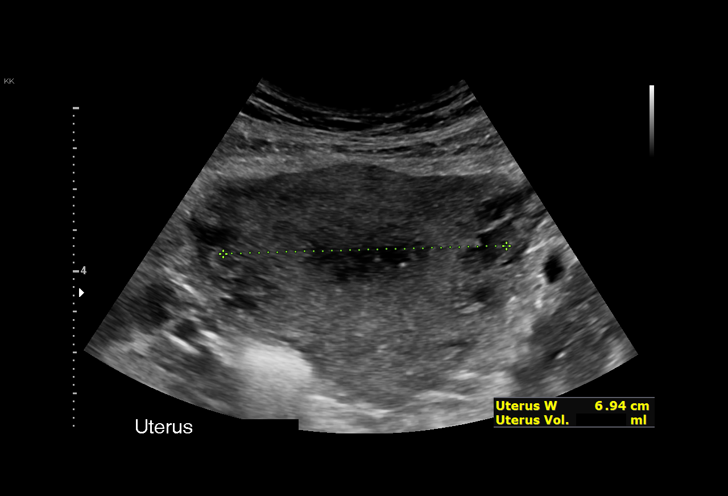

[14 of 25 positions shown; findings below may reference images not displayed]

FINDINGS: Uterus

Measurements: 11.9 cm x 5.2 cm x 6.9 = volume: 225.3 mL. No fibroids
or other mass visualized.

Endometrium

Thickness: 2.9 mm and heterogeneous in appearance. While hyperemia
is not identified, flow is seen within the endometrium on color
Doppler evaluation. A mild to moderate amount of heterogeneous
hypoechoic and anechoic material is seen throughout the endometrial
canal. No flow is seen within this region on color Doppler
evaluation.

Right ovary

Measurements: 2.0 cm x 1.7 cm x 2.5 cm = volume: 4.3 mL. Normal
appearance/no adnexal mass.

Left ovary

Measurements: 3.7 cm x 2.7 cm x 3.1 cm = volume: 16.4 mL. Normal
appearance/no adnexal mass.

Other findings

No abnormal free fluid.
IMPRESSION: 1. Enlarged postpartum uterus with thickened endometrium. A small
amount of retained products of conception cannot completely be
excluded. Correlation with follow-up pelvic ultrasound is
recommended.
2. Additional findings which may represent a mild to moderate amount
of chronic blood products within the endometrial canal. Follow-up
pelvic ultrasound is again recommended.

ADDENDUM:
EXAM: TRANSABDOMINAL ULTRASOUND OF THE PELVIS
FINDINGS: Uterus

Measurements: 11.9 cm x 5.2 cm x 6.9 = volume: 225.3 mL. No fibroids
or other mass visualized.

Endometrium

Thickness: 2.9 mm and heterogeneous in appearance. While hyperemia
is not identified, flow is seen within the endometrium on color
Doppler evaluation. A mild to moderate amount of heterogeneous
hypoechoic and anechoic material is seen throughout the endometrial
canal. No flow is seen within this region on color Doppler
evaluation.

Right ovary

Measurements: 2.0 cm x 1.7 cm x 2.5 cm = volume: 4.3 mL. Normal
appearance/no adnexal mass.

Left ovary

Measurements: 3.7 cm x 2.7 cm x 3.1 cm = volume: 16.4 mL. Normal
appearance/no adnexal mass.

Other findings

No abnormal free fluid.
IMPRESSION: 1. Enlarged postpartum uterus with thickened endometrium. A small
amount of retained products of conception cannot completely be
excluded. Correlation with follow-up pelvic ultrasound is
recommended.
2. Additional findings which may represent a mild to moderate amount
of chronic blood products within the endometrial canal. Follow-up
pelvic ultrasound is again recommended.

## 2020-09-01 MED ORDER — ONDANSETRON HCL 4 MG PO TABS: 4.0000 mg | ORAL_TABLET | ORAL | Status: DC | PRN

## 2020-09-01 MED ORDER — DIPHENHYDRAMINE HCL 25 MG PO CAPS: 25.0000 mg | ORAL_CAPSULE | Freq: Four times a day (QID) | ORAL | Status: DC | PRN

## 2020-09-01 MED ORDER — ACETAMINOPHEN 500 MG PO TABS
1000.0000 mg | ORAL_TABLET | Freq: Four times a day (QID) | ORAL | Status: DC | PRN
  Administered 2020-09-01: 1000 mg via ORAL
  Filled 2020-09-01 (×2): qty 2

## 2020-09-01 MED ORDER — LACTATED RINGERS IV SOLN: INTRAVENOUS | Status: DC

## 2020-09-01 MED ORDER — FERROUS SULFATE 325 (65 FE) MG PO TABS: 325.0000 mg | ORAL_TABLET | Freq: Two times a day (BID) | ORAL | Status: DC

## 2020-09-01 MED ORDER — COCONUT OIL OIL: 1.0000 "application " | TOPICAL_OIL | Status: DC | PRN

## 2020-09-01 MED ORDER — WITCH HAZEL-GLYCERIN EX PADS: 1.0000 "application " | MEDICATED_PAD | CUTANEOUS | Status: DC | PRN

## 2020-09-01 MED ORDER — TETANUS-DIPHTH-ACELL PERTUSSIS 5-2.5-18.5 LF-MCG/0.5 IM SUSY: 0.5000 mL | PREFILLED_SYRINGE | Freq: Once | INTRAMUSCULAR | Status: DC

## 2020-09-01 MED ORDER — DIBUCAINE (PERIANAL) 1 % EX OINT: 1.0000 "application " | TOPICAL_OINTMENT | CUTANEOUS | Status: DC | PRN

## 2020-09-01 MED ORDER — OXYCODONE HCL 5 MG PO TABS: 10.0000 mg | ORAL_TABLET | ORAL | Status: DC | PRN

## 2020-09-01 MED ORDER — ACETAMINOPHEN 325 MG PO TABS: 650.0000 mg | ORAL_TABLET | ORAL | Status: DC | PRN

## 2020-09-01 MED ORDER — SIMETHICONE 80 MG PO CHEW: 80.0000 mg | CHEWABLE_TABLET | ORAL | Status: DC | PRN

## 2020-09-01 MED ORDER — IBUPROFEN 600 MG PO TABS
600.0000 mg | ORAL_TABLET | Freq: Four times a day (QID) | ORAL | Status: DC
  Administered 2020-09-01 – 2020-09-02 (×3): 600 mg via ORAL
  Filled 2020-09-01 (×3): qty 1

## 2020-09-01 MED ORDER — OXYCODONE HCL 5 MG PO TABS: 5.0000 mg | ORAL_TABLET | ORAL | Status: DC | PRN

## 2020-09-01 MED ORDER — METHYLERGONOVINE MALEATE 0.2 MG PO TABS
0.2000 mg | ORAL_TABLET | Freq: Four times a day (QID) | ORAL | Status: DC
  Administered 2020-09-01 – 2020-09-02 (×2): 0.2 mg via ORAL
  Filled 2020-09-01 (×2): qty 1

## 2020-09-01 MED ORDER — CLINDAMYCIN PHOSPHATE 900 MG/50ML IV SOLN
900.0000 mg | Freq: Three times a day (TID) | INTRAVENOUS | Status: DC
  Administered 2020-09-01 – 2020-09-02 (×2): 900 mg via INTRAVENOUS
  Filled 2020-09-01 (×2): qty 50

## 2020-09-01 MED ORDER — BENZOCAINE-MENTHOL 20-0.5 % EX AERO: 1.0000 "application " | INHALATION_SPRAY | CUTANEOUS | Status: DC | PRN

## 2020-09-01 MED ORDER — ONDANSETRON HCL 4 MG/2ML IJ SOLN: 4.0000 mg | INTRAMUSCULAR | Status: DC | PRN

## 2020-09-01 MED ORDER — SENNOSIDES-DOCUSATE SODIUM 8.6-50 MG PO TABS
2.0000 | ORAL_TABLET | ORAL | Status: DC
  Administered 2020-09-02: 2 via ORAL
  Filled 2020-09-01: qty 2

## 2020-09-01 MED ORDER — GENTAMICIN SULFATE 40 MG/ML IJ SOLN
5.0000 mg/kg | INTRAVENOUS | Status: DC
  Administered 2020-09-01: 300 mg via INTRAVENOUS
  Filled 2020-09-01 (×2): qty 7.5

## 2020-09-01 MED ORDER — ZOLPIDEM TARTRATE 5 MG PO TABS: 5.0000 mg | ORAL_TABLET | Freq: Every evening | ORAL | Status: DC | PRN

## 2020-09-01 MED ORDER — MORPHINE SULFATE (PF) 4 MG/ML IV SOLN: 2.0000 mg | INTRAVENOUS | Status: DC | PRN

## 2020-09-01 MED ORDER — PRENATAL MULTIVITAMIN CH: 1.0000 | ORAL_TABLET | Freq: Every day | ORAL | Status: DC

## 2020-09-01 NOTE — MAU Note (Signed)
Pp 08/10/2020 vag delivery. Started having heavier  bleeding last week. C/o lower abd pain and cramping on her right side that runs down the front of her leg x 4 days. Also c/o she feels feverish. She takes Ibuprofen every 6 hours and pain and fever feeling  Goes away but then it comes back.

## 2020-09-01 NOTE — MAU Provider Note (Signed)
History     CSN: [id number]  Arrival date and time: 09/01/20 1124   First Provider Initiated Contact with Patient 09/01/20 1320      Chief Complaint  Patient presents with  . Vaginal Bleeding   29 y.o. G2P2 s/p SVD 3 weeks ago presenting with VB and abdominal pain. Reports onset of vaginal bleeding yesterday. States bleeding is heavy and worse when she urinates. Cannot quantify the amount. She is passing small clots. Abdominal pain started 4 days ago. Pain is worse on the right. Pain radiated down thigh. Rates pain 7/10. She's been taking Ibuprofen which helps then the pain returns. Also reports right breast pain. She is breast pumping but has not pumped in 2 days, states has been too tired. States she felt feverish since yesterday but has not checked her temperature.   OB History    Gravida  2   Para  2   Term  2   Preterm      AB      Living  1     SAB      TAB      Ectopic      Multiple  0   Live Births  1           Past Medical History:  Diagnosis Date  . Anemia     Past Surgical History:  Procedure Laterality Date  . NO PAST SURGERIES      No family history on file.  Social History   Tobacco Use  . Smoking status: Never Smoker  . Smokeless tobacco: Never Used  Vaping Use  . Vaping Use: Never used  Substance Use Topics  . Alcohol use: No  . Drug use: No    Allergies: No Known Allergies  Medications Prior to Admission  Medication Sig Dispense Refill Last Dose  . ibuprofen (ADVIL) 600 MG tablet Take 1 tablet (600 mg total) by mouth every 6 (six) hours. 30 tablet 0 08/31/2020 at Unknown time  . iron polysaccharides (FERREX 150) 150 MG capsule Take 1 capsule (150 mg total) by mouth daily.   Past Week at Unknown time  . Magnesium Oxide 400 (240 Mg) MG TABS Take 1 tablet (400 mg total) by mouth daily. For prevention of constipation. 30 tablet  Past Week at Unknown time  . Prenatal Vit-Fe Fumarate-FA (PRENATAL MULTIVITAMIN) TABS tablet Take 1  tablet by mouth daily at 12 noon. 10 tablet  Past Month at Unknown time  . acetaminophen (TYLENOL) 500 MG tablet Take 2 tablets (1,000 mg total) by mouth every 6 (six) hours as needed. 100 tablet 2   . benzocaine-Menthol (DERMOPLAST) 20-0.5 % AERO Apply 1 application topically as needed for irritation (perineal discomfort). 78 g 1   . coconut oil OIL Apply 1 application topically as needed.  0     Review of Systems  Constitutional: Negative for chills and fever.  Gastrointestinal: Positive for abdominal pain. Negative for nausea and vomiting.  Genitourinary: Positive for vaginal bleeding. Negative for dysuria, frequency and urgency.   Physical Exam   Blood pressure 107/74, pulse 85, temperature 98.2 F (36.8 C), resp. rate 18, weight 59.3 kg, SpO2 97 %, unknown if currently breastfeeding. Patient Vitals for the past 24 hrs:  BP Temp Pulse Resp SpO2 Weight  09/01/20 1742 107/74 98.2 F (36.8 C) 85 18 -- --  09/01/20 1315 116/79 -- (!) 122 -- 97 % --  09/01/20 1313 -- (!) 102.1 F (38.9 C) -- 18 -- --  09/01/20  1158 118/80 (!) 101.6 F (38.7 C) -- 18 -- 59.3 kg   Physical Exam Vitals and nursing note reviewed. Exam conducted with a chaperone present.  Constitutional:      General: She is not in acute distress.    Appearance: Normal appearance.  HENT:     Head: Normocephalic and atraumatic.  Pulmonary:     Effort: Pulmonary effort is normal. No respiratory distress.  Chest:     Breasts:        Right: Tenderness (firm) present. No swelling, mass or skin change.        Left: No swelling, skin change or tenderness (firm).  Abdominal:     General: There is no distension.     Palpations: Abdomen is soft. There is no mass.     Tenderness: There is no abdominal tenderness. There is no guarding or rebound.  Genitourinary:    Comments: External: no lesions or erythema Vagina: rugated, pink, moist, scant bloody discharge Uterus: + enlarged, anteverted, non tender, no CMT Adnexae: no  masses, no tenderness left, no tenderness right Cervix normal  Musculoskeletal:        General: Normal range of motion.     Cervical back: Normal range of motion.  Skin:    General: Skin is warm and dry.  Neurological:     General: No focal deficit present.     Mental Status: She is alert and oriented to person, place, and time.  Psychiatric:        Mood and Affect: Mood normal.        Behavior: Behavior normal.    Results for orders placed or performed during the hospital encounter of 09/01/20 (from the past 24 hour(s))  CBC with Differential/Platelet     Status: None   Collection Time: 09/01/20 12:54 PM  Result Value Ref Range   WBC 5.5 4.0 - 10.5 K/uL   RBC 3.97 3.87 - 5.11 MIL/uL   Hemoglobin 12.3 12.0 - 15.0 g/dL   HCT 16.1 36 - 46 %   MCV 97.7 80.0 - 100.0 fL   MCH 31.0 26.0 - 34.0 pg   MCHC 31.7 30.0 - 36.0 g/dL   RDW 09.6 04.5 - 40.9 %   Platelets 169 150 - 400 K/uL   nRBC 0.0 0.0 - 0.2 %   Neutrophils Relative % 78 %   Neutro Abs 4.3 1.7 - 7.7 K/uL   Lymphocytes Relative 16 %   Lymphs Abs 0.9 0.7 - 4.0 K/uL   Monocytes Relative 6 %   Monocytes Absolute 0.4 0.1 - 1.0 K/uL   Eosinophils Relative 0 %   Eosinophils Absolute 0.0 0.0 - 0.5 K/uL   Basophils Relative 0 %   Basophils Absolute 0.0 0.0 - 0.1 K/uL   Immature Granulocytes 0 %   Abs Immature Granulocytes 0.02 0.00 - 0.07 K/uL  Urinalysis, Routine w reflex microscopic     Status: Abnormal   Collection Time: 09/01/20  4:07 PM  Result Value Ref Range   Color, Urine STRAW (A) YELLOW   APPearance CLEAR CLEAR   Specific Gravity, Urine 1.002 (L) 1.005 - 1.030   pH 6.0 5.0 - 8.0   Glucose, UA NEGATIVE NEGATIVE mg/dL   Hgb urine dipstick LARGE (A) NEGATIVE   Bilirubin Urine NEGATIVE NEGATIVE   Ketones, ur 20 (A) NEGATIVE mg/dL   Protein, ur NEGATIVE NEGATIVE mg/dL   Nitrite NEGATIVE NEGATIVE   Leukocytes,Ua LARGE (A) NEGATIVE   RBC / HPF 0-5 0 - 5 RBC/hpf  WBC, UA 21-50 0 - 5 WBC/hpf   Bacteria, UA RARE  (A) NONE SEEN   Squamous Epithelial / LPF 0-5 0 - 5   Mucus PRESENT    US Pelvis Complete  Addendum Date: 09/01/2020   ADDENDUM REPORT: 09/01/2020 17:33 ADDENDUM: EXAM: TRANSABDOMINAL ULTRASOUND OF THE PELVIS TECHNIQUE: Transabdominal ultrasound examination of the pelvis was performed. Please note that transvaginal ultrasound was not performed as stated in the initial report. Electronically Signed   By: Aram Candela M.D.   On: 09/01/2020 17:33   Result Date: 09/01/2020 CLINICAL DATA:  Heavy vaginal bleeding. Status post vaginal delivery 3 weeks ago sent to rule out retained products of conception. EXAM: TRANSABDOMINAL AND TRANSVAGINAL ULTRASOUND OF PELVIS TECHNIQUE: Both transabdominal and transvaginal ultrasound examinations of the pelvis were performed. Transabdominal technique was performed for global imaging of the pelvis including uterus, ovaries, adnexal regions, and pelvic cul-de-sac. It was necessary to proceed with endovaginal exam following the transabdominal exam to visualize the uterus, endometrium, bilateral ovaries and bilateral adnexa. COMPARISON:  None FINDINGS: Uterus Measurements: 11.9 cm x 5.2 cm x 6.9 = volume: 225.3 mL. No fibroids or other mass visualized. Endometrium Thickness: 2.9 mm and heterogeneous in appearance. While hyperemia is not identified, flow is seen within the endometrium on color Doppler evaluation. A mild to moderate amount of heterogeneous hypoechoic and anechoic material is seen throughout the endometrial canal. No flow is seen within this region on color Doppler evaluation. Right ovary Measurements: 2.0 cm x 1.7 cm x 2.5 cm = volume: 4.3 mL. Normal appearance/no adnexal mass. Left ovary Measurements: 3.7 cm x 2.7 cm x 3.1 cm = volume: 16.4 mL. Normal appearance/no adnexal mass. Other findings No abnormal free fluid. IMPRESSION: 1. Enlarged postpartum uterus with thickened endometrium. A small amount of retained products of conception cannot completely be  excluded. Correlation with follow-up pelvic ultrasound is recommended. 2. Additional findings which may represent a mild to moderate amount of chronic blood products within the endometrial canal. Follow-up pelvic ultrasound is again recommended. Electronically Signed: By: Aram Candela M.D. On: 09/01/2020 15:48   MAU Course  Procedures Tylenol  MDM Labs and [place] ordered and reviewed. Consult with Dr. Shawnie Pons who recommends surgical management of retained POCs with concern for endometritis. Discussed presentation, clinical findings, and recommendations with Dr. Mora Appl. Plan for admit.  Assessment and Plan   1. Retained products of conception, postpartum   2. Endometritis    Admit Mngt per Dr. Tonny Branch, CNM 09/01/2020, 6:37 PM

## 2020-09-01 NOTE — H&P (Signed)
[name] is a 29 y.o. female, G2P2, SVD on 11/4 now PPD#22 presenting for admission to ante for PP ednometritis.   HPI per MAU: 29 y.o. G2P2 s/p SVD 3 weeks ago presenting with VB and abdominal pain. Reports onset of vaginal bleeding yesterday. States bleeding is heavy and worse when she urinates. Cannot quantify the amount. She is passing small clots. Abdominal pain started 4 days ago. Pain is worse on the right. Pain radiated down thigh. Rates pain 7/10. She's been taking Ibuprofen which helps then the pain returns. Also reports right breast pain. She is breast pumping but has not pumped in 2 days, states has been too tired. States she felt feverish since yesterday but has not checked her temperature  Patient Active Problem List   Diagnosis Date Noted  . Maternal anemia, with delivery - ABL 08/12/2020  . SVD (11/4) 08/11/2020  . Indication for care in labor or delivery 08/10/2020    Medications Prior to Admission  Medication Sig Dispense Refill Last Dose  . ibuprofen (ADVIL) 600 MG tablet Take 1 tablet (600 mg total) by mouth every 6 (six) hours. 30 tablet 0 08/31/2020 at Unknown time  . iron polysaccharides (FERREX 150) 150 MG capsule Take 1 capsule (150 mg total) by mouth daily.   Past Week at Unknown time  . Magnesium Oxide 400 (240 Mg) MG TABS Take 1 tablet (400 mg total) by mouth daily. For prevention of constipation. 30 tablet  Past Week at Unknown time  . Prenatal Vit-Fe Fumarate-FA (PRENATAL MULTIVITAMIN) TABS tablet Take 1 tablet by mouth daily at 12 noon. 10 tablet  Past Month at Unknown time  . acetaminophen (TYLENOL) 500 MG tablet Take 2 tablets (1,000 mg total) by mouth every 6 (six) hours as needed. 100 tablet 2   . benzocaine-Menthol (DERMOPLAST) 20-0.5 % AERO Apply 1 application topically as needed for irritation (perineal discomfort). 78 g 1   . coconut oil OIL Apply 1 application topically as needed.  0     Past Medical History:  Diagnosis Date  . Anemia      No  current facility-administered medications on file prior to encounter.   Current Outpatient Medications on File Prior to Encounter  Medication Sig Dispense Refill  . ibuprofen (ADVIL) 600 MG tablet Take 1 tablet (600 mg total) by mouth every 6 (six) hours. 30 tablet 0  . iron polysaccharides (FERREX 150) 150 MG capsule Take 1 capsule (150 mg total) by mouth daily.    . Magnesium Oxide 400 (240 Mg) MG TABS Take 1 tablet (400 mg total) by mouth daily. For prevention of constipation. 30 tablet   . Prenatal Vit-Fe Fumarate-FA (PRENATAL MULTIVITAMIN) TABS tablet Take 1 tablet by mouth daily at 12 noon. 10 tablet   . acetaminophen (TYLENOL) 500 MG tablet Take 2 tablets (1,000 mg total) by mouth every 6 (six) hours as needed. 100 tablet 2  . benzocaine-Menthol (DERMOPLAST) 20-0.5 % AERO Apply 1 application topically as needed for irritation (perineal discomfort). 78 g 1  . coconut oil OIL Apply 1 application topically as needed.  0     No Known Allergies  OB History    Gravida  2   Para  2   Term  2   Preterm      AB      Living  1     SAB      TAB      Ectopic      Multiple  0   Live Births  1          Past Medical History:  Diagnosis Date  . Anemia    Past Surgical History:  Procedure Laterality Date  . NO PAST SURGERIES     Family History: family history is not on file. Social History:  reports that she has never smoked. She has never used smokeless tobacco. She reports that she does not drink alcohol and does not use drugs.   ROS:  Review of Systems  Constitutional: Positive for chills and fever.  HENT: Negative.   Eyes: Negative.   Respiratory: Negative.   Cardiovascular: Negative.   Gastrointestinal: Positive for abdominal pain.  Genitourinary: Negative.   Musculoskeletal: Negative.        Right breast pain  Skin: Negative.   Neurological: Negative.   Endo/Heme/Allergies: Negative.   Psychiatric/Behavioral: Negative.      Physical Exam: BP 107/74    Pulse 85   Temp 98.2 F (36.8 C)   Resp 18   Wt 59.3 kg   SpO2 97%   Breastfeeding Unknown   BMI 22.59 kg/m   Physical Exam Vitals and nursing note reviewed. Exam conducted with a chaperone present.  Constitutional:      General: She is not in acute distress.    Appearance: Normal appearance.  HENT:     Head: Normocephalic and atraumatic.  Pulmonary:     Effort: Pulmonary effort is normal. No respiratory distress.  Chest:     Breasts:        Right: Tenderness (firm) present. No swelling, mass or skin change.        Left: No swelling, skin change or tenderness (firm).  Abdominal:     General: There is no distension.     Palpations: Abdomen is soft. There is no mass.     Tenderness: There is no abdominal tenderness. There is no guarding or rebound.  Genitourinary: Per MAU    Comments: External: no lesions or erythema Vagina: rugated, pink, moist, scant bloody discharge Uterus: + enlarged, anteverted, non tender, no CMT Adnexae: no masses, no tenderness left, no tenderness right Cervix normal  Musculoskeletal:        General: Normal range of motion.     Cervical back: Normal range of motion.  Skin:    General: Skin is warm and dry.  Neurological:     General: No focal deficit present.     Mental Status: She is alert and oriented to person, place, and time.  Psychiatric:        Mood and Affect: Mood normal.        Behavior: Behavior normal.   Labs: Results for orders placed or performed during the hospital encounter of 09/01/20 (from the past 24 hour(s))  CBC with Differential/Platelet     Status: None   Collection Time: 09/01/20 12:54 PM  Result Value Ref Range   WBC 5.5 4.0 - 10.5 K/uL   RBC 3.97 3.87 - 5.11 MIL/uL   Hemoglobin 12.3 12.0 - 15.0 g/dL   HCT 40.3 36 - 46 %   MCV 97.7 80.0 - 100.0 fL   MCH 31.0 26.0 - 34.0 pg   MCHC 31.7 30.0 - 36.0 g/dL   RDW 47.4 25.9 - 56.3 %   Platelets 169 150 - 400 K/uL   nRBC 0.0 0.0 - 0.2 %   Neutrophils Relative % 78  %   Neutro Abs 4.3 1.7 - 7.7 K/uL   Lymphocytes Relative 16 %   Lymphs Abs 0.9 0.7 - 4.0  K/uL   Monocytes Relative 6 %   Monocytes Absolute 0.4 0.1 - 1.0 K/uL   Eosinophils Relative 0 %   Eosinophils Absolute 0.0 0.0 - 0.5 K/uL   Basophils Relative 0 %   Basophils Absolute 0.0 0.0 - 0.1 K/uL   Immature Granulocytes 0 %   Abs Immature Granulocytes 0.02 0.00 - 0.07 K/uL  Urinalysis, Routine w reflex microscopic     Status: Abnormal   Collection Time: 09/01/20  4:07 PM  Result Value Ref Range   Color, Urine STRAW (A) YELLOW   APPearance CLEAR CLEAR   Specific Gravity, Urine 1.002 (L) 1.005 - 1.030   pH 6.0 5.0 - 8.0   Glucose, UA NEGATIVE NEGATIVE mg/dL   Hgb urine dipstick LARGE (A) NEGATIVE   Bilirubin Urine NEGATIVE NEGATIVE   Ketones, ur 20 (A) NEGATIVE mg/dL   Protein, ur NEGATIVE NEGATIVE mg/dL   Nitrite NEGATIVE NEGATIVE   Leukocytes,Ua LARGE (A) NEGATIVE   RBC / HPF 0-5 0 - 5 RBC/hpf   WBC, UA 21-50 0 - 5 WBC/hpf   Bacteria, UA RARE (A) NONE SEEN   Squamous Epithelial / LPF 0-5 0 - 5   Mucus PRESENT     Imaging:  US Pelvis Complete  Addendum Date: 09/01/2020   ADDENDUM REPORT: 09/01/2020 17:33 ADDENDUM: EXAM: TRANSABDOMINAL ULTRASOUND OF THE PELVIS TECHNIQUE: Transabdominal ultrasound examination of the pelvis was performed. Please note that transvaginal ultrasound was not performed as stated in the initial report. Electronically Signed   By: Aram Candela M.D.   On: 09/01/2020 17:33   Result Date: 09/01/2020 CLINICAL DATA:  Heavy vaginal bleeding. Status post vaginal delivery 3 weeks ago sent to rule out retained products of conception. EXAM: TRANSABDOMINAL AND TRANSVAGINAL ULTRASOUND OF PELVIS TECHNIQUE: Both transabdominal and transvaginal ultrasound examinations of the pelvis were performed. Transabdominal technique was performed for global imaging of the pelvis including uterus, ovaries, adnexal regions, and pelvic cul-de-sac. It was necessary to proceed with  endovaginal exam following the transabdominal exam to visualize the uterus, endometrium, bilateral ovaries and bilateral adnexa. COMPARISON:  None FINDINGS: Uterus Measurements: 11.9 cm x 5.2 cm x 6.9 = volume: 225.3 mL. No fibroids or other mass visualized. Endometrium Thickness: 2.9 mm and heterogeneous in appearance. While hyperemia is not identified, flow is seen within the endometrium on color Doppler evaluation. A mild to moderate amount of heterogeneous hypoechoic and anechoic material is seen throughout the endometrial canal. No flow is seen within this region on color Doppler evaluation. Right ovary Measurements: 2.0 cm x 1.7 cm x 2.5 cm = volume: 4.3 mL. Normal appearance/no adnexal mass. Left ovary Measurements: 3.7 cm x 2.7 cm x 3.1 cm = volume: 16.4 mL. Normal appearance/no adnexal mass. Other findings No abnormal free fluid. IMPRESSION: 1. Enlarged postpartum uterus with thickened endometrium. A small amount of retained products of conception cannot completely be excluded. Correlation with follow-up pelvic ultrasound is recommended. 2. Additional findings which may represent a mild to moderate amount of chronic blood products within the endometrial canal. Follow-up pelvic ultrasound is again recommended. Electronically Signed: By: Aram Candela M.D. On: 09/01/2020 15:48    MAU Course: Orders Placed This Encounter  Procedures  . Urine culture  . Resp Panel by RT-PCR (Flu A&B, Covid) Nasopharyngeal Swab  . US Pelvis Complete  . CBC with Differential/Platelet  . Urinalysis, Routine w reflex microscopic  . Notify physician (specify)  . Discontinue epidural catheter at conclusion of delivery and/or repair unless otherwise indicated by provider  . If  RhoGAM is indicated order per protocol.  . May convert IV to Saline Lock for antibiotic therapy if tolerating PO fluids well or to maintain IV site (i.e. for surgery in AM)  . May discontinue IV or Saline Lock if postpartum assessment WNL    Meds ordered this encounter  Medications  . acetaminophen (TYLENOL) tablet 1,000 mg  . clindamycin (CLEOCIN) IVPB 900 mg    Order Specific Question:   Antibiotic Indication:    Answer:   Other Indication (list below)    Order Specific Question:   Other Indication:    Answer:   endometritis  . gentamicin (GARAMYCIN) 300 mg in dextrose 5 % 50 mL IVPB    Order Specific Question:   Antibiotic Indication:    Answer:   Other Indication (list below)    Assessment/Plan: Jule Sersabeeh Greenbaum is a 29 y.o. female, G2P2, SVD on 11/4 now PPD#22 presenting for admission to ante for PP endometritis and retained products.   HPI per MAU: 29 y.o. G2P2 s/p SVD 3 weeks ago presenting with VB and abdominal pain. Reports onset of vaginal bleeding yesterday. States bleeding is heavy and worse when she urinates. Cannot quantify the amount. She is passing small clots. Abdominal pain started 4 days ago. Pain is worse on the right. Pain radiated down thigh. Rates pain 7/10. She's been taking Ibuprofen which helps then the pain returns. Also reports right breast pain. She is breast pumping but has not pumped in 2 days, states has been too tired. States she felt feverish since yesterday but has not checked her temperature  MAU action: Tylenol reduced fever from 102 to 98.2, US:  Enlarged postpartum uterus with thickened endometrium. A small amount of retained products of conception cannot completely be excluded. Correlation with follow-up pelvic ultrasound is recommended. 2. Additional findings which may represent a mild to moderate amount of chronic blood products within the endometrial canal. Follow-up pelvic ultrasound is again recommended.   Plan: Admit to ANte per consult with [facility] Clindamycin 900 IV Q6H Vanc 5mg /km Q24 hours Tylenol/motrin for pain and fever Morphine for severe pain LR 125mg /hr [facility] tomorrow [facility]27  [facility] Siena Poehler NP-C, CNM, MSN 09/01/2020, 6:01  PM

## 2020-09-01 NOTE — ED Triage Notes (Addendum)
Pt reports vaginal delivery of baby 08/10/20; has been having vaginal bleeding, but over past 5 days experiencing "heavy" vaginal bleeding with low abd pain.  Had initial f/u OB appt, but bleeding was minimal at that time; has not contacted OB about heavy bleeding.  Describes dizziness at times, but denies at present. Also states lesion to left upper and lower lip x 5 days.

## 2020-09-01 NOTE — ED Notes (Signed)
Discussed with H. Wieters: pt to go to [facility].  Pt and spouse verbalized understanding.

## 2020-09-02 ENCOUNTER — Inpatient Hospital Stay (HOSPITAL_COMMUNITY): Payer: Medicaid Other | Admitting: Certified Registered Nurse Anesthetist

## 2020-09-02 ENCOUNTER — Encounter (HOSPITAL_COMMUNITY): Payer: Self-pay | Admitting: Obstetrics & Gynecology

## 2020-09-02 ENCOUNTER — Encounter (HOSPITAL_COMMUNITY)
Admission: AD | Disposition: A | Discharge status: Home / Self Care | Payer: Self-pay | Source: Home / Self Care | Attending: Obstetrics & Gynecology

## 2020-09-02 ENCOUNTER — Inpatient Hospital Stay (HOSPITAL_COMMUNITY): Payer: Medicaid Other

## 2020-09-02 DIAGNOSIS — O8612 Endometritis following delivery: Secondary | ICD-10-CM | POA: Diagnosis not present

## 2020-09-02 DIAGNOSIS — R509 Fever, unspecified: Secondary | ICD-10-CM | POA: Diagnosis present

## 2020-09-02 DIAGNOSIS — Z20822 Contact with and (suspected) exposure to covid-19: Secondary | ICD-10-CM | POA: Diagnosis not present

## 2020-09-02 DIAGNOSIS — B9689 Other specified bacterial agents as the cause of diseases classified elsewhere: Secondary | ICD-10-CM | POA: Diagnosis not present

## 2020-09-02 DIAGNOSIS — Z9889 Other specified postprocedural states: Secondary | ICD-10-CM

## 2020-09-02 HISTORY — PX: OPERATIVE ULTRASOUND: SHX5996

## 2020-09-02 HISTORY — PX: DILATION AND EVACUATION: SHX1459

## 2020-09-02 SURGERY — DILATION AND EVACUATION, UTERUS
Anesthesia: General

## 2020-09-02 MED ORDER — FENTANYL CITRATE (PF) 100 MCG/2ML IJ SOLN: 50.0000 ug | INTRAMUSCULAR | Status: DC | PRN

## 2020-09-02 MED ORDER — DOXYCYCLINE HYCLATE 100 MG PO TABS
200.0000 mg | ORAL_TABLET | ORAL | Status: AC
  Administered 2020-09-02: 200 mg via ORAL
  Filled 2020-09-02: qty 2

## 2020-09-02 MED ORDER — LIDOCAINE HCL (PF) 2 % IJ SOLN
INTRAMUSCULAR | Status: AC
  Filled 2020-09-02: qty 5

## 2020-09-02 MED ORDER — METHYLERGONOVINE MALEATE 0.2 MG/ML IJ SOLN
INTRAMUSCULAR | Status: AC
  Filled 2020-09-02: qty 1

## 2020-09-02 MED ORDER — DEXAMETHASONE SODIUM PHOSPHATE 10 MG/ML IJ SOLN
INTRAMUSCULAR | Status: DC | PRN
  Administered 2020-09-02: 8 mg via INTRAVENOUS

## 2020-09-02 MED ORDER — PROPOFOL 10 MG/ML IV BOLUS: INTRAVENOUS | Status: DC | PRN

## 2020-09-02 MED ORDER — CHLORHEXIDINE GLUCONATE 0.12 % MT SOLN: 15.0000 mL | Freq: Once | OROMUCOSAL | Status: AC

## 2020-09-02 MED ORDER — LIDOCAINE 2% (20 MG/ML) 5 ML SYRINGE
INTRAMUSCULAR | Status: DC | PRN
  Administered 2020-09-02: 60 mg via INTRAVENOUS
  Administered 2020-09-02: 40 mg via INTRAVENOUS

## 2020-09-02 MED ORDER — ARTIFICIAL TEARS OPHTHALMIC OINT
TOPICAL_OINTMENT | OPHTHALMIC | Status: AC
  Filled 2020-09-02: qty 3.5

## 2020-09-02 MED ORDER — ONDANSETRON HCL 4 MG/2ML IJ SOLN
INTRAMUSCULAR | Status: AC
  Filled 2020-09-02: qty 2

## 2020-09-02 MED ORDER — CHLORHEXIDINE GLUCONATE 0.12 % MT SOLN
OROMUCOSAL | Status: AC
  Administered 2020-09-02: 15 mL via OROMUCOSAL
  Filled 2020-09-02: qty 15

## 2020-09-02 MED ORDER — BUPIVACAINE HCL (PF) 0.25 % IJ SOLN
INTRAMUSCULAR | Status: AC
  Filled 2020-09-02: qty 30

## 2020-09-02 MED ORDER — LIDOCAINE HCL (CARDIAC) PF 100 MG/5ML IV SOSY: PREFILLED_SYRINGE | INTRAVENOUS | Status: DC | PRN

## 2020-09-02 MED ORDER — FENTANYL CITRATE (PF) 100 MCG/2ML IJ SOLN
INTRAMUSCULAR | Status: AC
  Administered 2020-09-02: 50 ug via INTRAVENOUS
  Filled 2020-09-02: qty 2

## 2020-09-02 MED ORDER — METHYLERGONOVINE MALEATE 0.2 MG/ML IJ SOLN
INTRAMUSCULAR | Status: DC | PRN
  Administered 2020-09-02: .2 mg via INTRAMUSCULAR

## 2020-09-02 MED ORDER — PROPOFOL 10 MG/ML IV BOLUS
INTRAVENOUS | Status: AC
  Filled 2020-09-02: qty 20

## 2020-09-02 MED ORDER — MIDAZOLAM HCL 2 MG/2ML IJ SOLN
INTRAMUSCULAR | Status: AC
  Filled 2020-09-02: qty 2

## 2020-09-02 MED ORDER — FENTANYL CITRATE (PF) 250 MCG/5ML IJ SOLN
INTRAMUSCULAR | Status: DC | PRN
  Administered 2020-09-02: 100 ug via INTRAVENOUS
  Administered 2020-09-02: 25 ug via INTRAVENOUS
  Administered 2020-09-02: 50 ug via INTRAVENOUS

## 2020-09-02 MED ORDER — DEXAMETHASONE SODIUM PHOSPHATE 10 MG/ML IJ SOLN
INTRAMUSCULAR | Status: AC
  Filled 2020-09-02: qty 1

## 2020-09-02 MED ORDER — PHENYLEPHRINE 40 MCG/ML (10ML) SYRINGE FOR IV PUSH (FOR BLOOD PRESSURE SUPPORT)
PREFILLED_SYRINGE | INTRAVENOUS | Status: DC | PRN
  Administered 2020-09-02 (×4): 80 ug via INTRAVENOUS

## 2020-09-02 MED ORDER — SUCCINYLCHOLINE CHLORIDE 20 MG/ML IJ SOLN: INTRAMUSCULAR | Status: DC | PRN

## 2020-09-02 MED ORDER — LACTATED RINGERS IV SOLN: INTRAVENOUS | Status: DC | PRN

## 2020-09-02 MED ORDER — MIDAZOLAM HCL 2 MG/2ML IJ SOLN
INTRAMUSCULAR | Status: DC | PRN
  Administered 2020-09-02: 2 mg via INTRAVENOUS

## 2020-09-02 MED ORDER — SUCCINYLCHOLINE CHLORIDE 200 MG/10ML IV SOSY
PREFILLED_SYRINGE | INTRAVENOUS | Status: DC | PRN
  Administered 2020-09-02: 100 mg via INTRAVENOUS

## 2020-09-02 MED ORDER — SUCCINYLCHOLINE CHLORIDE 200 MG/10ML IV SOSY
PREFILLED_SYRINGE | INTRAVENOUS | Status: AC
  Filled 2020-09-02: qty 10

## 2020-09-02 MED ORDER — ONDANSETRON HCL 4 MG/2ML IJ SOLN
INTRAMUSCULAR | Status: DC | PRN
  Administered 2020-09-02: 4 mg via INTRAVENOUS

## 2020-09-02 MED ORDER — 0.9 % SODIUM CHLORIDE (POUR BTL) OPTIME
TOPICAL | Status: DC | PRN
  Administered 2020-09-02: 1000 mL

## 2020-09-02 MED ORDER — PHENYLEPHRINE 40 MCG/ML (10ML) SYRINGE FOR IV PUSH (FOR BLOOD PRESSURE SUPPORT)
PREFILLED_SYRINGE | INTRAVENOUS | Status: AC
  Filled 2020-09-02: qty 10

## 2020-09-02 MED ORDER — PROPOFOL 10 MG/ML IV BOLUS
INTRAVENOUS | Status: DC | PRN
  Administered 2020-09-02: 10 mg via INTRAVENOUS
  Administered 2020-09-02: 130 mg via INTRAVENOUS

## 2020-09-02 MED ORDER — GLYCOPYRROLATE PF 0.2 MG/ML IJ SOSY
PREFILLED_SYRINGE | INTRAMUSCULAR | Status: DC | PRN
  Administered 2020-09-02: .2 mg via INTRAVENOUS

## 2020-09-02 MED ORDER — FENTANYL CITRATE (PF) 250 MCG/5ML IJ SOLN
INTRAMUSCULAR | Status: AC
  Filled 2020-09-02: qty 5

## 2020-09-02 SURGICAL SUPPLY — 19 items
CATH ROBINSON RED A/P 16FR (CATHETERS) ×3 IMPLANT
CNTNR URN SCR LID CUP LEK RST (MISCELLANEOUS) ×2 IMPLANT
CONT SPEC 4OZ STRL OR WHT (MISCELLANEOUS) ×1
GLOVE BIO SURGEON STRL SZ 6.5 (GLOVE) ×6 IMPLANT
GLOVE BIOGEL PI IND STRL 6.5 (GLOVE) ×2 IMPLANT
GLOVE BIOGEL PI IND STRL 7.0 (GLOVE) ×2 IMPLANT
GLOVE BIOGEL PI INDICATOR 6.5 (GLOVE) ×1
GLOVE BIOGEL PI INDICATOR 7.0 (GLOVE) ×1
GOWN STRL REUS W/ TWL LRG LVL3 (GOWN DISPOSABLE) ×6 IMPLANT
GOWN STRL REUS W/TWL LRG LVL3 (GOWN DISPOSABLE) ×3
HOSE CONNECTING 18IN BERKELEY (TUBING) ×3 IMPLANT
KIT BERKELEY 1ST TRI 3/8 NO TR (MISCELLANEOUS) ×3 IMPLANT
KIT TURNOVER KIT B (KITS) ×3 IMPLANT
PACK VAGINAL MINOR WOMEN LF (CUSTOM PROCEDURE TRAY) ×3 IMPLANT
PAD OB MATERNITY 4.3X12.25 (PERSONAL CARE ITEMS) ×3 IMPLANT
SET BERKELEY SUCTION TUBING (SUCTIONS) ×3 IMPLANT
TOWEL GREEN STERILE FF (TOWEL DISPOSABLE) ×6 IMPLANT
UNDERPAD 30X36 HEAVY ABSORB (UNDERPADS AND DIAPERS) ×3 IMPLANT
VACURETTE 7MM CVD STRL WRAP (CANNULA) ×3 IMPLANT

## 2020-09-02 NOTE — Progress Notes (Addendum)
OBGYN Progress Note  Encounter completed with aid of Arabic interpretor through [employer] video interpreting.  In to discuss procedure with patient -  Dilation and suction curettage for retained products of conception and postpartum endometritis.  All questions were invited and answered.  Doxycycline 200mg  PO on call to OR ordered.   I discussed with the patient that this surgery is to remove the retained products of conception.  Prior to surgery, the risks and benefits of the surgery, as well as alternative treatments, have been discussed.  The risks include, but are not limited to bleeding, including the need for a blood transfusion, infection, damage to organs and tissues, including uterine perforation, requiring additional surgery, postoperative pain, short-term and long-term, failure of the procedure to control symptoms, need for hysterectomy to control bleeding, deep vein thrombosis and/or pulmonary embolism, painful intercourse, complications the course of which cannot be predicted or prevented, and death.  Patient was consented for blood products.  The patient is aware that bleeding may result in the need for a blood transfusion which includes risk of transmission of HIV (1:2 million), Hepatitis C (1:2 million), and Hepatitis B (1:200 thousand) and transfusion reaction.  Patient voiced understanding of the above risks as well as understanding of indications for blood transfusion.   , DO

## 2020-09-02 NOTE — Anesthesia Preprocedure Evaluation (Addendum)
Anesthesia Evaluation  Patient identified by MRN, date of birth, ID band Patient awake    Reviewed: Allergy & Precautions, NPO status , Patient's Chart, lab work & pertinent test results  History of Anesthesia Complications Negative for: history of anesthetic complications  Airway Mallampati: I  TM Distance: >3 FB Neck ROM: Full    Dental  (+) Dental Advisory Given, Teeth Intact   Pulmonary neg pulmonary ROS,  Covid-19 Nucleic Acid Test Results Lab Results      Component                Value               Date                      SARSCOV2NAA              NEGATIVE            09/01/2020                SARSCOV2NAA              NEGATIVE            08/10/2020                SARSCOV2NAA              Not Detected        06/11/2019              breath sounds clear to auscultation       Cardiovascular negative cardio ROS   Rhythm:Regular     Neuro/Psych negative neurological ROS  negative psych ROS   GI/Hepatic negative GI ROS, Neg liver ROS,   Endo/Other  negative endocrine ROS  Renal/GU negative Renal ROS     Musculoskeletal negative musculoskeletal ROS (+)   Abdominal   Peds  Hematology negative hematology ROS (+) Lab Results      Component                Value               Date                      WBC                      5.5                 09/01/2020                HGB                      12.3                09/01/2020                HCT                      38.8                09/01/2020                MCV                      97.7                09/01/2020  PLT                      169                 09/01/2020              Anesthesia Other Findings   Reproductive/Obstetrics RETAINED POC                            Anesthesia Physical Anesthesia Plan  ASA: I  Anesthesia Plan: General   Post-op Pain Management:    Induction: Intravenous  PONV Risk Score and  Plan: 3 and Ondansetron and Dexamethasone  Airway Management Planned: Oral ETT  Additional Equipment: None  Intra-op Plan:   Post-operative Plan: Extubation in OR  Informed Consent: I have reviewed the patients History and Physical, chart, labs and discussed the procedure including the risks, benefits and alternatives for the proposed anesthesia with the patient or authorized representative who has indicated his/her understanding and acceptance.     Dental advisory given  Plan Discussed with: CRNA and Surgeon  Anesthesia Plan Comments: (Interpreter used)       Anesthesia Quick Evaluation

## 2020-09-02 NOTE — Progress Notes (Signed)
Patient to Main OR via stretcher with OR transporter.

## 2020-09-02 NOTE — Discharge Summary (Signed)
Postpartum Readmission OB Discharge Summary     Patient Name: [name] DOB: 26-Sep-1991 MRN: [id number]  Date of admission: 09/01/2020 Delivering MD: Verdis Prime B  Date of delivery: 08/10/2020 Type of delivery: SVD was readmitted for PP endometritis and placenta retained products.   Feeding: breast and bottle Infant being discharge to home with mother in stable condition.   Admitting diagnosis: Endometritis [N71.9] Intrauterine pregnancy: Unknown     Secondary diagnosis:  Active Problems:   Endometritis   Retained placenta   Fever   S/P D&C (status post dilation and curettage)                               History of Present Illness: Ms. [name] is a 29 y.o. female, G2P2, SVD on 11/4 now PPD#22 presenting for admission to ante for PP endometritis and retained placenta. The patient has been followed at  [facility]  Her pregnancy has been complicated by:  Patient Active Problem List   Diagnosis Date Noted  . Retained placenta 09/02/2020  . Fever 09/02/2020  . S/P D&C (status post dilation and curettage) 09/02/2020  . Endometritis 09/01/2020    Hospital course:   PER HPI for MAU visit 11/26: HPI per MAU: 29 y.o. G2P2 s/p SVD 3 weeks ago presenting with VB and abdominal pain. Reports onset of vaginal bleeding yesterday. States bleeding is heavy and worse when she urinates. Cannot quantify the amount. She is passing small clots. Abdominal pain started 4 days ago. Pain is worse on the right. Pain radiated down thigh. Rates pain 7/10. She's been taking Ibuprofen which helps then the pain returns. Also reports right breast pain. She is breast pumping but has not pumped in 2 days, states has been too tired. States she felt feverish since yesterday but has not checked her temperature  [name] is a 29 y.o. female, G2P2, SVD on 11/4 now PPD#22 presenting for admission to ante for PP endometritis and retained placenta. Upon  admission pt had fever of 102, which was decreased with tylenol, fluids and IV abx, pt received x24 hour IV clinda and vanc, and PO methergine series. Pt received D&C today on 11/27 with DR Connye Burkitt for retained products with success. Pt was given doxy prior to the procedure for prophylatics. EBL was 20. Admission WBC was 5.0, starting hgb was 12.3. Pt tolerated procedure well and is anxious to be discharged. Interpretor used for discharge, pt stable and meets discharge requirements. Per DR Connye Burkitt no further abx is needed, motrin and tylenol for pain.  Pt bleeding is stable, afebrile and currently denies pain.   Physical exam  Vitals:   09/02/20 1329 09/02/20 1330 09/02/20 1345 09/02/20 1514  BP:  102/75 109/72 112/60  Pulse: 65 65 72 (!) 54  Resp: (!) 23 19 16 18   Temp:  97.8 F (36.6 C) 97.7 F (36.5 C) 97.6 F (36.4 C)  TempSrc:   Oral Oral  SpO2: 99% 98% 100% 100%  Weight:       General: alert, cooperative and no distress Lochia: appropriate Uterine Fundus: firm Abdomen: soft, non-tender.  Perineum: Intact DVT Evaluation: No evidence of DVT seen on physical exam. Negative Homan's sign. No cords or calf tenderness. No significant calf/ankle edema.  Labs: Lab Results  Component Value Date   WBC 5.5 09/01/2020   HGB 12.3 09/01/2020   HCT 38.8 09/01/2020   MCV 97.7 09/01/2020   PLT  169 09/01/2020   No flowsheet data found.  Date of discharge: 09/02/2020 Discharge Diagnoses: postpartum endometrititis and retained products.  Discharge instruction: per After Visit Summary and "Baby and Me Booklet".  After visit meds:   Activity:           unrestricted and pelvic rest Advance as tolerated. Pelvic rest for 6 weeks.  Diet:                routine Medications: PNV and Ibuprofen Postpartum contraception: Undecided Condition:  Pt discharge to home in stable and condition  PP endometritis: f/u with [facility] in 2 weeks.  Meds: Allergies as of 09/02/2020   No Known Allergies      Medication List    TAKE these medications   acetaminophen 500 MG tablet Commonly known as: TYLENOL Take 2 tablets (1,000 mg total) by mouth every 6 (six) hours as needed.   benzocaine-Menthol 20-0.5 % Aero Commonly known as: DERMOPLAST Apply 1 application topically as needed for irritation (perineal discomfort).   coconut oil Oil Apply 1 application topically as needed.   ibuprofen 600 MG tablet Commonly known as: ADVIL Take 1 tablet (600 mg total) by mouth every 6 (six) hours.   iron polysaccharides 150 MG capsule Commonly known as: Ferrex 150 Take 1 capsule (150 mg total) by mouth daily.   Magnesium Oxide 400 (240 Mg) MG Tabs Take 1 tablet (400 mg total) by mouth daily. For prevention of constipation.   prenatal multivitamin Tabs tablet Take 1 tablet by mouth daily at 12 noon.       Discharge Follow Up:   Follow-up Information    [facility]. Schedule an appointment as soon as possible for a visit in 2 week(s).   Specialty: Obstetrics and Gynecology Contact information: [address] [place] [address] [phone]               [place], NP-C, CNM 09/02/2020, 5:24 PM  Dale Warren, FNP

## 2020-09-02 NOTE — Op Note (Addendum)
Pre Op Dx:  Postpartum endometritis, Retained products of conception Post Op Dx:  Same as pre-op Procedure:  Dilation and suction curettage under ultrasound guidance   Surgeon:  Dr. Steva Ready Assistants:  None Anesthesia:  General   EBL:  20cc  IVF:  500cc UOP:  100cc via in and out catheter prior to start of procedure   Drains:  In and out foley catheter Specimen removed:  Products of conception - sent to pathology Device(s) implanted: None Case Type:  Clean-contaminated Findings:  Thickened endometrium on [place] and products of conception removed during procedure. Complications: None Indications:  29 y.o.-old G2P2 s/p SVD on 08/10/2020 who presented with fever, abdominal pain, and heavy vaginal bleeding.  On [place], thickened endometrium was noted which raised suspicion for retained products of conception. Description of each procedure:  After informed consent was obtained, the patient was brought to the operating room in the dorsal supine position.  After administration of general anesthesia, she was placed in the dorsal lithotomy position.  She was given Doxycycline 200mg  PO prior to the procedure in pre-op.  She was prepped and draped in the usual sterile fashion.  Her bladder was drained, but not entirely in order to provide an acoustic window, using an in-and-out foley catheter.  A pre-operative time-out was performed.  The anterior lip of the cervix was gasped with a single-tooth tenaculum and the cervix serially dilated to accommodate an 51mm curved suction cannula.  Multiple passes were required to remove all retained products.  A banjo curette was passed multiple times until the uterus had a gritty texture throughout.  On ultrasound, there was a clean uterine stripe at the conclusion of the procedure.  Methergine 0.20mg  IV x 1 was administered.  The single-tooth tenaculum was removed and silver nitrate was placed on the tenaculum sites.  Adequate hemostasis was noted.  A bimanual massage was  performed and a firm uterus was noted at the conclusion of the procedure.  All counts were correct x 2.  The patient was awakened, extubated, and appeared to have tolerated the procedure well.   Disposition:  PACU  4m, DO

## 2020-09-02 NOTE — Discharge Instructions (Signed)
Endometritis  Endometritis is irritation, soreness, or inflammation that affects the lining of the uterus (endometrium). Infection is usually the cause of endometritis. It is important to get treatment to prevent complications. Common complications may include more severe infections and not being able to have children(infertility). What are the causes? This condition may be caused by:  Bacterial infections.  STIs (sexually transmitted infections).  A miscarriage or childbirth, especially after a long labor or cesarean delivery.  Certain gynecological procedures. These may include dilation and curettage (D&C), hysteroscopy, or birth control (contraceptive) insertion.  Tuberculosis (TB). What are the signs or symptoms? Symptoms of this condition include:  Fever.  Lower abdomen (abdominal) pain.  Pelvis (pelvic) pain.  Abnormal vaginal discharge or bleeding.  Abdominal bloating (distention) or swelling.  General discomfort or generally feeling ill.  Discomfort with bowel movements.  Constipation. How is this diagnosed? This condition may be diagnosed based on:  A physical exam, including a pelvic exam.  Tests, such as: ? Blood tests. ? Removal of a sample of endometrial tissue for testing (endometrial biopsy). ? Examining a sample of vaginal discharge under a microscope (wet prep). ? Removal of a sample of fluid from the cervix for testing (cervical culture). ? Surgical examination of the pelvis and abdomen. How is this treated? This condition is treated with:  Antibiotic medicines.  For more severe cases, hospitalization may be needed to give fluids and antibiotics directly into a vein through an IV tube. Follow these instructions at home:  Take over-the-counter and prescription medicines only as told by your health care provider.  Drink enough fluid to keep your urine clear or pale yellow.  Take your antibiotic medicine as told by your health care provider. Do  not stop taking the antibiotic even if you start to feel better.  Do not douche or have sex (including vaginal, oral, and anal sex) until your health care provider approves.  If your endometritis was caused by an STI, do not have sex (including vaginal, oral, and anal sex) until your partner has also been treated for the STI.  Return to your normal activities as told by your health care provider. Ask your health care provider what activities are safe for you.  Keep all follow-up visits as told by your health care provider. This is important. Contact a health care provider if:  You have pain that does not get better with medicine.  You have a fever.  You have pain with bowel movements. Get help right away if:  You have abdominal swelling.  You have abdominal pain that gets worse.  You have bad-smelling vaginal discharge, or an increased amount of vaginal discharge.  You have abnormal vaginal bleeding.  You have nausea and vomiting. Summary  Endometritis affects the lining of the uterus (endometrium) and is usually caused by an infection.  It is important to get treatment to prevent complications.  You have several treatment options for endometritis. Treatment may include antibiotics and IV fluids.  Take your antibiotic medicine as told by your health care provider. Do not stop taking the antibiotic even if you start to feel better.  Do not douche or have sex (including vaginal, oral, and anal sex) until your health care provider approves. This information is not intended to replace advice given to you by your health care provider. Make sure you discuss any questions you have with your health care provider. Document Revised: 09/05/2017 Document Reviewed: 10/08/2016 Elsevier Patient Education  2020 [employer]vier Inc.   Postpartum Hemorrhage Postpartum  hemorrhage is excessive blood loss after childbirth. Vaginal bleeding after delivery is normal and should be expected. Bleeding  (lochia) will occur for several days after childbirth. This can be expected with normal vaginal deliveries and cesarean deliveries. However, postpartum hemorrhage is a potentially serious condition. What are the causes? This condition is caused by:  A loss of muscle tone in the uterus after childbirth. This can be caused by: ? An abnormal placenta. ? Infection. ? Bladder swelling (distension).  Failure to deliver all of the placenta or the retention of clots.  Wounds in the birth canal caused by delivery of the fetus.  Infection of the uterus.  Infection of tissue around the fetus.  A tear in the uterus.  Tearing of the vagina or cervix during delivery.  A maternal bleeding disorder that prevents blood from clotting (rare). What increases the risk? This condition is likely to develop in people who:  Have a history of postpartum hemorrhage.  Had a delivery that lasted longer than usual.  Have an excess of amniotic fluid in the amniotic sac (polyhydramnios), leading the uterus to stretch too much.  Have delivered quintuplets or more babies.  Had high blood pressure, seizures, or coma during pregnancy.  Had a condition called preeclampsia or eclampsia during pregnancy.  Had problems with the placenta.  Had complications during labor or delivery.  Are obese.  Are 82 years old or older.  Are Asian or Hispanic. What are the signs or symptoms? Symptoms of this condition include:  Passing large clots or pieces of tissue. These may be small pieces of placenta left after delivery.  Soaking more than one sanitary pad per hour for several hours.  Heavy, bright-red bleeding that occurs 4 days or more after delivery.  Discharge that has a bad smell.  An unexplained fever.  Nausea or vomiting.  Pain or swelling near the vagina or perineum.  A drop in blood pressure.  Lightheadedness or fainting.  Shortness of breath.  A fast heart rate that happens with very  little activity.  Signs of shock, such as: ? Blurry vision. ? Chills. ? Dizziness. ? Weakness. How is this diagnosed? This condition may be diagnosed based on:  Your symptoms.  A physical exam of your perineum, vagina, cervix, and uterus.  Tests, including: ? Blood pressure and pulse measurements. ? Blood tests. ? Blood clotting tests. ? Ultrasonography. How is this treated? Treatment for this condition depends on the severity of your symptoms. It may include:  Uterine massage.  Medicines to help the uterus contract.  Blood transfusions.  Fluids given through the vein.  A medical procedure to compress arteries supplying the uterus. Sometimes bleeding occurs if portions of the placenta are left behind in the uterus after delivery. If this happens, a curettage or scraping of the inside of the uterus must be done (rare). This usually stops the bleeding. If curettage does not stop the bleeding, surgery may be done to remove the uterus (hysterectomy), but this rarely occurs. If bleeding is due to clotting or bleeding problems that are not related to the pregnancy, other treatments may be needed. Follow these instructions at home:  Limit your activity as directed by your health care provider.Your health care provider may order bed rest (getting up to go to the bathroom only) or may allow you to continue light activity.  Keep track of the number of pads you use each day and how soaked (saturated) they are. Write down this information.  Do not use tampons.  Do not douche or have sexual intercourse until your health care provider approves.  Drink enough fluids to keep your urine clear or pale yellow.  Get enough rest.  Eat foods that are rich in iron, such as spinach, red meat, and legumes.  Take any over-the-counter and prescription medicines only as told by your health care provider.  Keep all follow-up visits as told by your health care provider. This is important. Get  help right away if:  You experience severe cramps in your stomach, back, or abdomen.  You have a fever.  You pass large clots or tissue. Save any tissue for your health care provider to look at.  Your bleeding increases.  You have heavy bleeding that soaks one pad per hour for 2 hours in a row.  You faint or become dizzy, weak, or lightheaded.  Your sanitary pad count per hour is increasing.  You are urinating less than usual or not at all.  You have shortness of breath.  Your heart rate is faster than usual.  You have sudden chest pain. This information is not intended to replace advice given to you by your health care provider. Make sure you discuss any questions you have with your health care provider. Document Revised: 09/05/2017 Document Reviewed: 04/26/2016 Elsevier Patient Education  2020 [employer].

## 2020-09-02 NOTE — Transfer of Care (Signed)
Immediate Anesthesia Transfer of Care Note  Patient: [name]  Procedure(s) Performed: DILATATION AND EVACUATION WITH ULTRASOUND GUIDANCE (N/A ) OPERATIVE ULTRASOUND  Patient Location: PACU  Anesthesia Type:General  Level of Consciousness: patient cooperative and responds to stimulation  Airway & Oxygen Therapy: Patient Spontanous Breathing and Patient connected to face mask oxygen  Post-op Assessment: Report given to RN and Post -op Vital signs reviewed and stable  Post vital signs: Reviewed and stable  Last Vitals:  Vitals Value Taken Time  BP 118/78 09/02/20 1225  Temp    Pulse 63 09/02/20 1228  Resp 13 09/02/20 1228  SpO2 100 % 09/02/20 1228  Vitals shown include unvalidated device data.  Last Pain:  Vitals:   09/02/20 1024  TempSrc:   PainSc: 2       Patients Stated Pain Goal: 2 (09/02/20 0622)  Complications: No complications documented.

## 2020-09-03 LAB — URINE CULTURE

## 2020-09-04 ENCOUNTER — Encounter (HOSPITAL_COMMUNITY): Payer: Self-pay | Admitting: Obstetrics and Gynecology

## 2020-09-04 ENCOUNTER — Other Ambulatory Visit (HOSPITAL_COMMUNITY): Payer: Self-pay | Admitting: Obstetrics and Gynecology

## 2020-09-05 LAB — SURGICAL PATHOLOGY

## 2020-09-07 NOTE — Anesthesia Postprocedure Evaluation (Signed)
Anesthesia Post Note  Patient: Mathea Frieling  Procedure(s) Performed: DILATATION AND EVACUATION WITH ULTRASOUND GUIDANCE (N/A ) OPERATIVE ULTRASOUND     Patient location during evaluation: PACU Anesthesia Type: General Level of consciousness: awake and alert Pain management: pain level controlled Vital Signs Assessment: post-procedure vital signs reviewed and stable Respiratory status: spontaneous breathing, nonlabored ventilation, respiratory function stable and patient connected to nasal cannula oxygen Cardiovascular status: blood pressure returned to baseline and stable Postop Assessment: no apparent nausea or vomiting Anesthetic complications: no   No complications documented.  Last Vitals:  Vitals:   09/02/20 1345 09/02/20 1514  BP: 109/72 112/60  Pulse: 72 (!) 54  Resp: 16 18  Temp: 36.5 C 36.4 C  SpO2: 100% 100%    Last Pain:  Vitals:   09/02/20 1514  TempSrc: Oral  PainSc:                  Makinzi Prieur

## 2021-05-17 ENCOUNTER — Telehealth (INDEPENDENT_AMBULATORY_CARE_PROVIDER_SITE_OTHER): Payer: 59

## 2021-05-17 DIAGNOSIS — O0991 Supervision of high risk pregnancy, unspecified, first trimester: Secondary | ICD-10-CM | POA: Insufficient documentation

## 2021-05-17 DIAGNOSIS — O0990 Supervision of high risk pregnancy, unspecified, unspecified trimester: Secondary | ICD-10-CM | POA: Insufficient documentation

## 2021-05-17 DIAGNOSIS — Z3491 Encounter for supervision of normal pregnancy, unspecified, first trimester: Secondary | ICD-10-CM

## 2021-05-17 DIAGNOSIS — Z3A00 Weeks of gestation of pregnancy not specified: Secondary | ICD-10-CM

## 2021-05-17 DIAGNOSIS — Z3490 Encounter for supervision of normal pregnancy, unspecified, unspecified trimester: Secondary | ICD-10-CM

## 2021-05-17 MED ORDER — BLOOD PRESSURE KIT DEVI: 1.0000 | Freq: Once | 0 refills | Status: AC

## 2021-05-17 MED ORDER — PROMETHAZINE HCL 25 MG PO TABS: 25.0000 mg | ORAL_TABLET | Freq: Four times a day (QID) | ORAL | 0 refills | Status: DC | PRN

## 2021-05-17 MED ORDER — GOJJI WEIGHT SCALE MISC: 1.0000 | Freq: Once | 0 refills | Status: AC

## 2021-05-17 MED ORDER — PRENATAL VITAMIN 27-0.8 MG PO TABS: 1.0000 | ORAL_TABLET | Freq: Every day | ORAL | 11 refills | Status: DC

## 2021-05-17 NOTE — Progress Notes (Signed)
Reviewed leg pain with Alysia Penna, MD who states this is likely not caused by pregnancy and patient may follow up with PCP or take Tylenol. Called pt with [facility] interpreter ID [phone]. Recommended pt trial Epsom salt bath and Tylenol and before bed. Offered nurse visit to check for UTI due to pain prior to urination. Pt given date and time of anatomy US.   Fleet Contras RN 05/17/21

## 2021-05-17 NOTE — Progress Notes (Signed)
New OB Intake  Called mobile number; VM not set up. Called home number; not a valid number. Called pt contact; VM left by [facility] interpreter ID [phone] stating I am calling to begin virtual appt and will call again in 10 minutes. Pt connected to MyChart video visit at 11:25 AM. [facility] interpreter ID #[id number] present virtually for remainder of encounter. Verified that I am speaking with the correct person using two identifiers. Nurse is located at [facility] and pt is located at home.  I discussed the limitations, risks, security and privacy concerns of performing an evaluation and management service by telephone and the availability of in person appointments. I also discussed with the patient that there may be a patient responsible charge related to this service. The patient expressed understanding and agreed to proceed.  I explained I am completing New OB Intake today. We discussed her EDD of 12/07/21 that is based on LMP of 03/02/21. Pt is G3/P2001. I reviewed her allergies, medications, Medical/Surgical/OB history, and appropriate screenings. I informed her of [facility] services. Based on history, this is an uncomplicated pregnancy.   Patient Active Problem List   Diagnosis Date Noted   Supervision of low-risk pregnancy, first trimester 05/17/2021   Retained placenta 09/02/2020   Fever 09/02/2020   S/P D&C (status post dilation and curettage) 09/02/2020   Endometritis 09/01/2020   Concerns addressed today Pt requests c-section due to traumatic birth and neonatal demise. Explained that patient may discuss with provider at first appointment.  Stomach pain prior to urinating, denies any additional symptoms of UTI. Offered nurse visit for UTI check.  Bilateral leg pain and feeling of legs being hot; this only occurs at night. Recommended Epsom salt bath prior to bed and Tylenol.   Patient reports light bleeding in July with no pain. Denies any pain or bleeding since that time. Reviewed when to present to MAU  for evaluation.   Delivery Plans:  Plans to deliver at [facility].   MyChart/Babyscripts MyChart access verified. I explained pt will have some visits in office and some virtually. Patient declined Babyscripts due to language barrier.   Blood Pressure Cuff  Blood pressure cuff ordered for patient to pick-up from [employer]. Explained after first prenatal appt pt will check weekly and document in Babyscripts.  Weight scale: Weight scale ordered for patient to pick up form [facility].   Anatomy US Explained first scheduled [place] will be around 19 weeks. Anatomy US scheduled for 07/16/21 at 1445. Pt notified to arrive at 1430.  Labs Discussed Avelina Laine genetic screening with patient. Pt states she and her partner had genetic testing prior to neonatal demise earlier this year. Provider to review need for [facility]. Pt would like Panorama drawn. Routine prenatal labs needed.  COVID Vaccine Patient has not had covid vaccine.   Mother/ Baby Dyad Candidate?    Not a candidate.  Social Determinants of Health Food Insecurity: Patient denies food insecurity. WIC Referral: Pt is enrolled in [facility] currently.  Transportation: Patient denies transportation needs. Childcare: Discussed no children allowed at ultrasound appointments. Offered childcare services; patient declines childcare services at this time.  First visit review  I reviewed new OB appt with pt. I explained she will have a visit with provider that includes pelvic exam with PAP and ob bloodwork with genetic screening. Explained pt will be seen by Lyndel Safe, MD at first visit; encounter routed to appropriate provider. Explained that patient will be seen by pregnancy navigator following visit with provider.  Marjo Bicker, RN 05/17/2021  4:35 PM

## 2021-05-18 ENCOUNTER — Other Ambulatory Visit: Payer: Self-pay

## 2021-05-18 ENCOUNTER — Ambulatory Visit (INDEPENDENT_AMBULATORY_CARE_PROVIDER_SITE_OTHER): Payer: 59

## 2021-05-18 VITALS — BP 122/69 | HR 87 | Wt 129.4 lb

## 2021-05-18 DIAGNOSIS — R300 Dysuria: Secondary | ICD-10-CM

## 2021-05-18 NOTE — Progress Notes (Signed)
Patient was assessed and managed by nursing staff during this encounter. I have reviewed the chart and agree with the documentation and plan. I have also made any necessary editorial changes.  Warden Fillers, MD 05/18/2021 12:17 PM

## 2021-05-18 NOTE — Progress Notes (Signed)
Pt here today for urinary frequency, urgency especially at night, also having small amounts of urine. Having pain in lower abd before urinating. Denies vaginal bleeding, discharge or odor.  Pt advised will do UA in office today.   UA: negative today in office.   Pt advised will send for OB urine culture to make sure for infection and once results are back in 3-4 days, she will be contacted if has any need for treatment. Pt verbalized understanding and agreeable to plan of care.   Judeth Cornfield, RN

## 2021-05-21 NOTE — Progress Notes (Signed)
Attestation of Attending Supervision of clinical support staff: I agree with the care provided to this patient and was available for any consultation.  I have reviewed the RN's note and chart. I was available for consult and to see the patient if needed.   At her upcoming new OB visit she would like to discuss mode of delivery.   Lyndel Safe MD MPH Attending Physician Faculty [facility]- [facility]

## 2021-05-24 LAB — URINE CULTURE, OB REFLEX

## 2021-05-24 LAB — CULTURE, OB URINE

## 2021-05-29 ENCOUNTER — Telehealth: Payer: Self-pay | Admitting: General Practice

## 2021-05-29 DIAGNOSIS — B951 Streptococcus, group B, as the cause of diseases classified elsewhere: Secondary | ICD-10-CM

## 2021-05-29 MED ORDER — AMOXICILLIN 500 MG PO CAPS: 500.0000 mg | ORAL_CAPSULE | Freq: Three times a day (TID) | ORAL | 0 refills | Status: AC

## 2021-05-29 NOTE — Telephone Encounter (Signed)
Called patient with [facility] interpreter [phone] & informed her of results as well as medication instructions. Patient verbalized understanding. Rx sent to pharmacy

## 2021-05-29 NOTE — Telephone Encounter (Signed)
-----   Message from Warden Fillers, MD sent at 05/27/2021 12:19 AM EDT ----- GBS in urine, treat in labor, amoxicillin 500 mg q 8 hours for 5 days

## 2021-05-31 ENCOUNTER — Encounter: Payer: Self-pay | Admitting: Family Medicine

## 2021-05-31 ENCOUNTER — Other Ambulatory Visit: Payer: Self-pay

## 2021-05-31 ENCOUNTER — Other Ambulatory Visit (HOSPITAL_COMMUNITY)
Admission: RE | Admit: 2021-05-31 | Discharge: 2021-05-31 | Disposition: A | Discharge status: Home / Self Care | Payer: 59 | Source: Ambulatory Visit | Attending: Family Medicine | Admitting: Family Medicine

## 2021-05-31 ENCOUNTER — Ambulatory Visit (INDEPENDENT_AMBULATORY_CARE_PROVIDER_SITE_OTHER): Payer: 59 | Admitting: Family Medicine

## 2021-05-31 VITALS — BP 109/72 | HR 79 | Wt 133.3 lb

## 2021-05-31 DIAGNOSIS — Z8709 Personal history of other diseases of the respiratory system: Secondary | ICD-10-CM | POA: Diagnosis not present

## 2021-05-31 DIAGNOSIS — Z634 Disappearance and death of family member: Secondary | ICD-10-CM

## 2021-05-31 DIAGNOSIS — O0991 Supervision of high risk pregnancy, unspecified, first trimester: Secondary | ICD-10-CM | POA: Diagnosis present

## 2021-05-31 DIAGNOSIS — O09299 Supervision of pregnancy with other poor reproductive or obstetric history, unspecified trimester: Secondary | ICD-10-CM | POA: Diagnosis not present

## 2021-05-31 DIAGNOSIS — R8271 Bacteriuria: Secondary | ICD-10-CM | POA: Diagnosis not present

## 2021-05-31 HISTORY — DX: Supervision of pregnancy with other poor reproductive or obstetric history, unspecified trimester: O09.299

## 2021-05-31 HISTORY — DX: Disappearance and death of family member: Z63.4

## 2021-05-31 HISTORY — DX: Personal history of other diseases of the respiratory system: Z87.09

## 2021-05-31 NOTE — Progress Notes (Signed)
Patient complains of pain in pelvic when it's being pressed on

## 2021-05-31 NOTE — Progress Notes (Signed)
INITIAL PRENATAL VISIT  Subjective:   [name] is being seen today for her first obstetrical visit.  This is a planned pregnancy. This is a desired pregnancy.  She is at [redacted]w[redacted]d gestation by LMP. Her obstetrical history is significant for  neonatal demise due to meconium aspirtation . Relationship with FOB: spouse, living together. Patient does intend to breast feed. Pregnancy history fully reviewed.  Patient reports no complaints.  Indications for ASA therapy (per uptodate) One of the following: Previous pregnancy with preeclampsia, especially early onset and with an adverse outcome No Multifetal gestation No Chronic hypertension No Type 1 or 2 diabetes mellitus No Chronic kidney disease No Autoimmune disease (antiphospholipid syndrome, systemic lupus erythematosus) No  Two or more of the following: Nulliparity No Obesity (body mass index >30 kg/m2) No Family history of preeclampsia in mother or sister No Age ?35 years No Sociodemographic characteristics (African American race, low socioeconomic level) Yes Personal risk factors (eg, previous pregnancy with low birth weight or small for gestational age infant, previous adverse pregnancy outcome [eg, stillbirth], interval >10 years between pregnancies) No  Indications for early GDM screening  First-degree relative with diabetes No BMI >30kg/m2 No Age > 25 Yes Previous birth of an infant weighing ?4000 g No Gestational diabetes mellitus in a previous pregnancy No Glycated hemoglobin ?5.7 percent (39 mmol/mol), impaired glucose tolerance, or impaired fasting glucose on previous testing No High-risk race/ethnicity (eg, African American, Latino, Native American, Asian American, Pacific Islander) Yes Previous stillbirth of unknown cause No Maternal birthweight > 9 lbs No History of cardiovascular disease No Hypertension or on therapy for hypertension No High-density lipoprotein cholesterol level <35 mg/dL (1.19 mmol/L) and/or  a triglyceride level >250 mg/dL (1.47 mmol/L) No Polycystic ovary syndrome No Physical inactivity No Other clinical condition associated with insulin resistance (eg, severe obesity, acanthosis nigricans) No Current use of glucocorticoids No   Early screening tests: FBS, A1C, Random CBG, glucose challenge   Review of Systems:   Review of Systems  Objective:    Obstetric History OB History  Gravida Para Term Preterm AB Living  3 2 2  0 0 1  SAB IAB Ectopic Multiple Live Births  0 0 0 0 2    # Outcome Date GA Lbr Len/2nd Weight Sex Delivery Anes PTL Lv  3 Current           2 Term 08/10/20 [redacted]w[redacted]d    Vag-Spont   DEC  1 Term 07/12/16 [redacted]w[redacted]d 36:45 / 00:22 6 lb 12.8 oz (3.085 kg) F Vag-Spont EPI  LIV    Past Medical History:  Diagnosis Date   Anemia     Past Surgical History:  Procedure Laterality Date   DILATION AND EVACUATION N/A 09/02/2020   Procedure: DILATATION AND EVACUATION WITH ULTRASOUND GUIDANCE;  Surgeon: 09/04/2020, DO;  Location: [facility] OR;  Service: Gynecology;  Laterality: N/A;   NO PAST SURGERIES     OPERATIVE ULTRASOUND  09/02/2020   Procedure: OPERATIVE ULTRASOUND;  Surgeon: 09/04/2020, DO;  Location: [facility] OR;  Service: Gynecology;;    Current Outpatient Medications on File Prior to Visit  Medication Sig Dispense Refill   Prenatal Vit-Fe Fumarate-FA (PRENATAL VITAMIN) 27-0.8 MG TABS Take 1 tablet by mouth daily. 30 tablet 11   acetaminophen (TYLENOL) 500 MG tablet Take 2 tablets (1,000 mg total) by mouth every 6 (six) hours as needed. (Patient not taking: Reported on 05/31/2021) 100 tablet 2   amoxicillin (AMOXIL) 500 MG capsule Take 1 capsule (500 mg total) by mouth  every 8 (eight) hours for 5 days. (Patient not taking: Reported on 05/31/2021) 15 capsule 0   promethazine (PHENERGAN) 25 MG tablet Take 1 tablet (25 mg total) by mouth every 6 (six) hours as needed for nausea or vomiting. (Patient not taking: Reported on 05/31/2021) 30 tablet 0   No current  facility-administered medications on file prior to visit.    No Known Allergies  Social History:  reports that she has never smoked. She has never used smokeless tobacco. She reports that she does not drink alcohol and does not use drugs.  History reviewed. No pertinent family history.  The following portions of the patient's history were reviewed and updated as appropriate: allergies, current medications, past family history, past medical history, past social history, past surgical history and problem list.  Review of Systems Review of Systems  Constitutional:  Negative for chills and fever.  HENT:  Negative for congestion and sore throat.   Eyes:  Negative for pain and visual disturbance.  Respiratory:  Negative for cough, chest tightness and shortness of breath.   Cardiovascular:  Negative for chest pain.  Gastrointestinal:  Negative for abdominal pain, diarrhea, nausea and vomiting.  Endocrine: Negative for cold intolerance and heat intolerance.  Genitourinary:  Negative for dysuria and flank pain.  Musculoskeletal:  Negative for back pain.  Skin:  Negative for rash.  Allergic/Immunologic: Negative for food allergies.  Neurological:  Negative for dizziness and light-headedness.  Psychiatric/Behavioral:  Negative for agitation.      Physical Exam:  BP 109/72   Pulse 79   Wt 133 lb 4.8 oz (60.5 kg)   LMP 03/02/2021   BMI 23.04 kg/m  CONSTITUTIONAL: Well-developed, well-nourished female in no acute distress.  HENT:  Normocephalic, atraumatic, External right and left ear normal. Oropharynx is clear and moist EYES: Conjunctivae normal. No scleral icterus.  NECK: Normal range of motion, supple, no masses.  Normal thyroid.  SKIN: Skin is warm and dry. No rash noted. Not diaphoretic. No erythema. No pallor. MUSCULOSKELETAL: Normal range of motion. No tenderness.  No cyanosis, clubbing, or edema.   NEUROLOGIC: Alert and oriented to person, place, and time. Normal muscle tone  coordination.  PSYCHIATRIC: Normal mood and affect. Normal behavior. Normal judgment and thought content. CARDIOVASCULAR: Normal heart rate noted, regular rhythm RESPIRATORY: Clear to auscultation bilaterally. Effort and breath sounds normal, no problems with respiration noted. BREASTS: Symmetric in size. No masses, skin changes, nipple drainage, or lymphadenopathy. ABDOMEN: Soft, normal bowel sounds, no distention noted.  No tenderness, rebound or guarding. Fundal ht: pelvic rim PELVIC: Normal appearing external genitalia; normal appearing vaginal mucosa and cervix.  No abnormal discharge noted.  Pap smear obtained.  Normal uterine size, no other palpable masses, no uterine or adnexal tenderness. FHR: 159   Assessment:    Pregnancy: G3P2001 1. Supervision of high risk pregnancy in first trimester - CBC/D/Plt+RPR+Rh+ABO+RubIgG... - Hemoglobin A1c - Genetic Screening - Culture, OB Urine - Cytology - PAP( [place])  Patient desires CS- she reports her reason is to avoid what happened with her last delivery. We discussed that meconium aspiration can happen with any mode of delivery and a having a major surgery will not assure this does not happen again. Patient adamantly wants a cesarean. I discussed that it is early to make this decision and they I must discuss with my partner. I reviewed the high risk nature of c-sections. Patient shared she desires a total of 3 children thus at least one more pregnancy after this one. Discussed  briefly the increasing risks with every surgery.Patient is open to discussion but does feel strongly she wants a primary CS.   Patient also had retained placenta and required DC.   There seems to be appropriate birth trauma and grief associated with delivery plans.     Plan:     Initial labs drawn. Prenatal vitamins. Problem list reviewed and updated. Reviewed in detail the nature of the practice with collaborative care between  Genetic screening discussed:  NIPS/First trimester screen/Quad/AFP requested. Role of ultrasound in pregnancy discussed; Anatomy US: requested. Amniocentesis discussed: not indicated. Follow up in 4 weeks. Discussed clinic routines, schedule of care and testing, genetic screening options, involvement of students and residents under the direct supervision of APPs and doctors and presence of female providers. Pt verbalized understanding.  Future Appointments  Date Time Provider Department Center  07/05/2021  3:35 PM Federico Flake, MD [facility]  07/16/2021  2:45 PM [facility] US4 [facility]    Federico Flake, MD 05/31/2021 4:06 PM

## 2021-06-01 ENCOUNTER — Encounter: Payer: Self-pay | Admitting: *Deleted

## 2021-06-01 LAB — CYTOLOGY - PAP
Chlamydia: NEGATIVE
Comment: NEGATIVE
Comment: NEGATIVE
Comment: NORMAL
Diagnosis: NEGATIVE
High risk HPV: NEGATIVE
Neisseria Gonorrhea: NEGATIVE

## 2021-06-01 LAB — CBC/D/PLT+RPR+RH+ABO+RUBIGG...
Antibody Screen: NEGATIVE
Basophils Absolute: 0 10*3/uL (ref 0.0–0.2)
Basos: 0 %
EOS (ABSOLUTE): 0 10*3/uL (ref 0.0–0.4)
Eos: 0 %
HCV Ab: <0.1 s/co ratio (ref 0.0–0.9)
HIV Screen 4th Generation wRfx: NONREACTIVE
Hematocrit: 35 % (ref 34.0–46.6)
Hemoglobin: 11.8 g/dL (ref 11.1–15.9)
Hepatitis B Surface Ag: NEGATIVE
Immature Grans (Abs): 0.1 10*3/uL (ref 0.0–0.1)
Immature Granulocytes: 1 %
Lymphocytes Absolute: 1.2 10*3/uL (ref 0.7–3.1)
Lymphs: 17 %
MCH: 30.3 pg (ref 26.6–33.0)
MCHC: 33.7 g/dL (ref 31.5–35.7)
MCV: 90 fL (ref 79–97)
Monocytes Absolute: 0.4 10*3/uL (ref 0.1–0.9)
Monocytes: 5 %
Neutrophils Absolute: 5.8 10*3/uL (ref 1.4–7.0)
Neutrophils: 77 %
Platelets: 136 10*3/uL — ABNORMAL LOW (ref 150–450)
RBC: 3.89 x10E6/uL (ref 3.77–5.28)
RDW: 12.8 % (ref 11.7–15.4)
RPR Ser Ql: NONREACTIVE
Rh Factor: POSITIVE
Rubella Antibodies, IGG: 19.8 index (ref >0.99)
WBC: 7.5 10*3/uL (ref 3.4–10.8)

## 2021-06-01 LAB — HEMOGLOBIN A1C
Est. average glucose Bld gHb Est-mCnc: 111 mg/dL
Hgb A1c MFr Bld: 5.5 % (ref 4.8–5.6)

## 2021-06-01 LAB — HCV INTERPRETATION

## 2021-06-01 LAB — TSH: TSH: 0.931 u[IU]/mL (ref 0.450–4.500)

## 2021-06-02 LAB — CULTURE, OB URINE

## 2021-06-02 LAB — URINE CULTURE, OB REFLEX: Organism ID, Bacteria: NO GROWTH

## 2021-06-25 ENCOUNTER — Encounter: Payer: Self-pay | Admitting: General Practice

## 2021-07-05 ENCOUNTER — Other Ambulatory Visit: Payer: Self-pay

## 2021-07-05 ENCOUNTER — Ambulatory Visit (INDEPENDENT_AMBULATORY_CARE_PROVIDER_SITE_OTHER): Payer: 59 | Admitting: Family Medicine

## 2021-07-05 VITALS — BP 109/63 | HR 81 | Wt 137.6 lb

## 2021-07-05 DIAGNOSIS — O0991 Supervision of high risk pregnancy, unspecified, first trimester: Secondary | ICD-10-CM

## 2021-07-05 NOTE — Progress Notes (Signed)
   PRENATAL VISIT NOTE  Subjective:  [name] is a 30 y.o. G3P2001 at 109w6d being seen today for ongoing prenatal care.  She is currently monitored for the following issues for this high-risk pregnancy and has Supervision of high risk pregnancy in first trimester; History of retained placenta in prior pregnancy, currently pregnant; Prior delivery with meconium aspiration; GBS bacteriuria; and Loss of infant on their problem list.  Patient reports  occasionally pelvic pressure/pain, mild in nature .  Contractions: Irritability. Vag. Bleeding: Small.  Movement: Absent. Denies leaking of fluid.   The following portions of the patient's history were reviewed and updated as appropriate: allergies, current medications, past family history, past medical history, past social history, past surgical history and problem list.   Objective:   Vitals:   07/05/21 1603  BP: 109/63  Pulse: 81  Weight: 137 lb 9.1 oz (62.4 kg)    Fetal Status: Fetal Heart Rate (bpm): 152   Movement: Absent     General:  Alert, oriented and cooperative. Patient is in no acute distress.  Skin: Skin is warm and dry. No rash noted.   Cardiovascular: Normal heart rate noted  Respiratory: Normal respiratory effort, no problems with respiration noted  Abdomen: Soft, gravid, appropriate for gestational age.  Pain/Pressure: Absent     Pelvic: Cervical exam deferred        Extremities: Normal range of motion.  Edema: None  Mental Status: Normal mood and affect. Normal behavior. Normal judgment and thought content.   Assessment and Plan:  Pregnancy: G3P2001 at [redacted]w[redacted]d 1. Supervision of high risk pregnancy in first trimester Up to date Knows about [place] No questions or concerns today Reassured about what sounds like broad ligament pain FH is 3 fingers below umbilicus Will continue to address with patient her desire for pLTCS, we did not discuss today and we should assure her fears of repeat meconium aspiration are  addressed.  - AFP, Serum, Open Spina Bifida  Preterm labor symptoms and general obstetric precautions including but not limited to vaginal bleeding, contractions, leaking of fluid and fetal movement were reviewed in detail with the patient. Please refer to After Visit Summary for other counseling recommendations.   Return in about 4 weeks (around 08/02/2021) for Routine prenatal care, MD or APP.  Future Appointments  Date Time Provider Department Center  07/16/2021  2:45 PM [facility] US5 [facility]  08/08/2021 10:15 AM Reva Bores, MD [facility]    Federico Flake, MD

## 2021-07-05 NOTE — Progress Notes (Signed)
q 

## 2021-07-07 LAB — AFP, SERUM, OPEN SPINA BIFIDA
AFP MoM: 0.84
AFP Value: 38.2 ng/mL
Gest. Age on Collection Date: 17.6 weeks
Maternal Age At EDD: 32.1 yr
OSBR Risk 1 IN: 10000
Test Results:: NEGATIVE
Weight: 138 [lb_av]

## 2021-07-16 ENCOUNTER — Other Ambulatory Visit: Payer: Self-pay

## 2021-07-16 ENCOUNTER — Other Ambulatory Visit: Payer: Self-pay | Admitting: Family Medicine

## 2021-07-16 ENCOUNTER — Ambulatory Visit: Payer: 59 | Attending: Family Medicine

## 2021-07-16 ENCOUNTER — Other Ambulatory Visit: Payer: Self-pay | Admitting: *Deleted

## 2021-07-16 DIAGNOSIS — Z3491 Encounter for supervision of normal pregnancy, unspecified, first trimester: Secondary | ICD-10-CM | POA: Insufficient documentation

## 2021-07-16 DIAGNOSIS — Z362 Encounter for other antenatal screening follow-up: Secondary | ICD-10-CM

## 2021-07-16 IMAGING — US US MFM OB DETAIL+14 WK
1 series · 13 of 28 positions shown · non-contrast
Comparison: none

[Series 1: us mfm ob detail+14 wk · 13 of 141 slices shown]
[im 6/141]
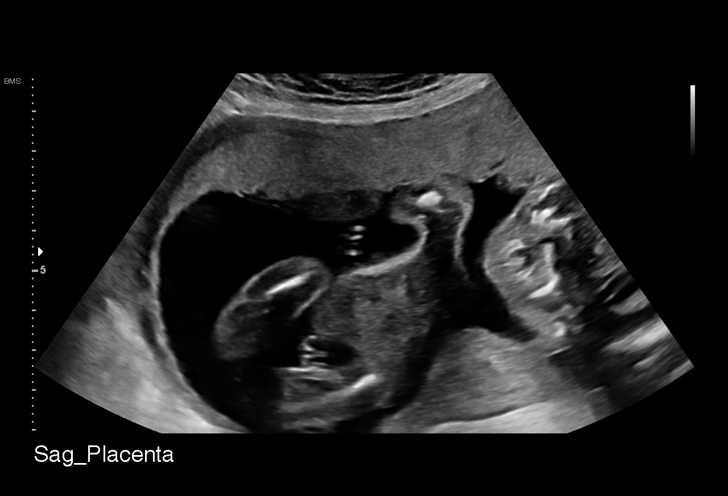
[im 16/141]
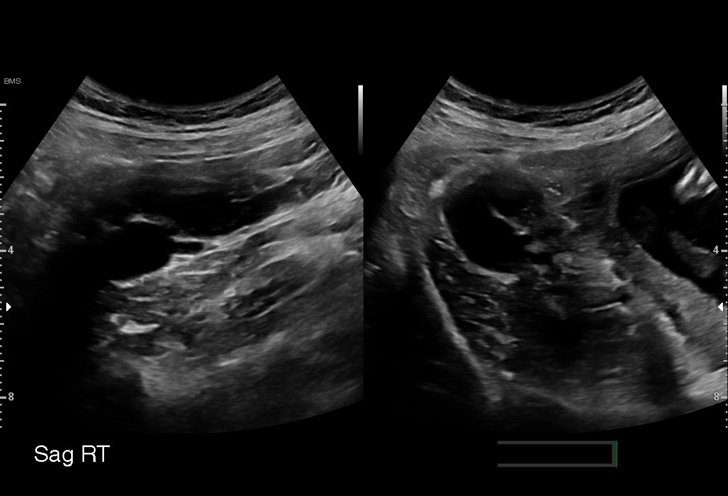
[im 26/141]
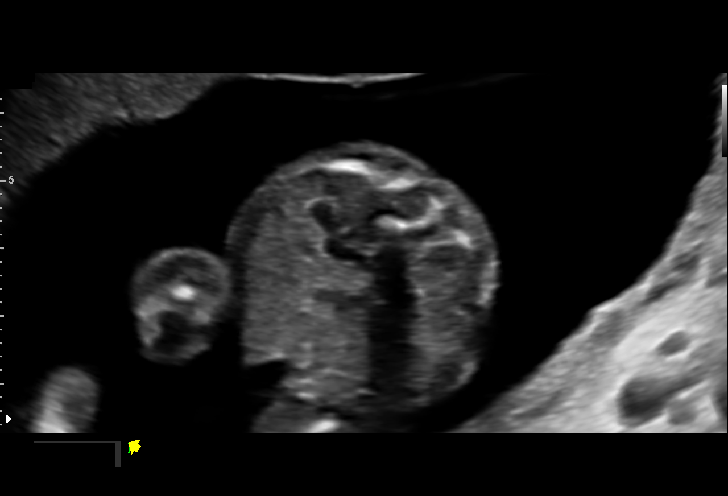
[im 37/141]
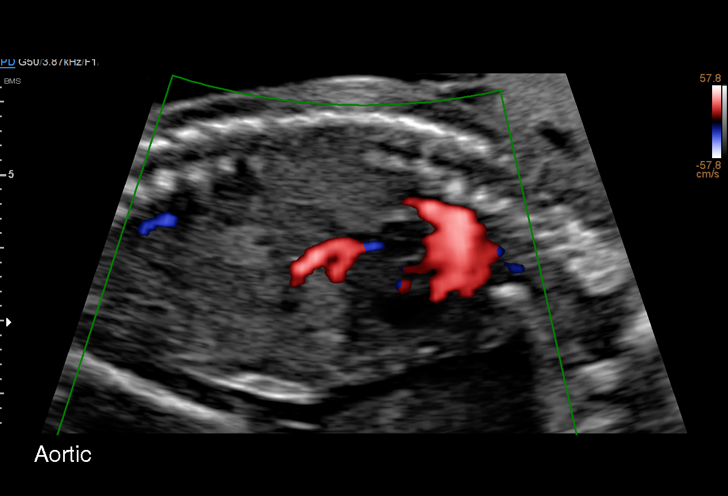
[im 47/141]
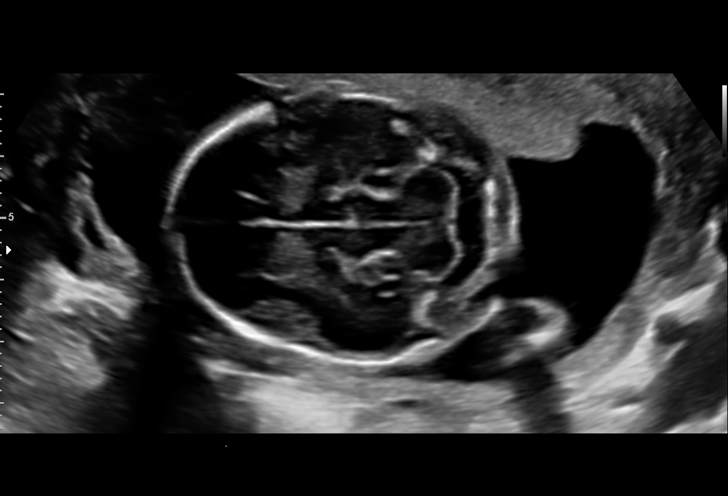
[im 58/141]
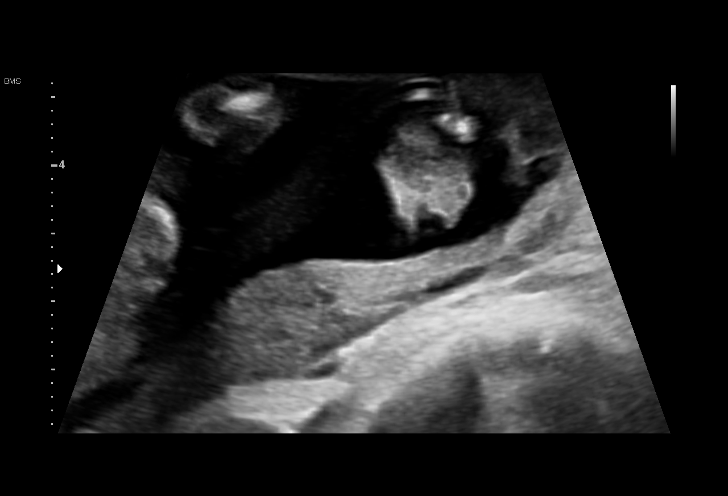
[im 73/141]
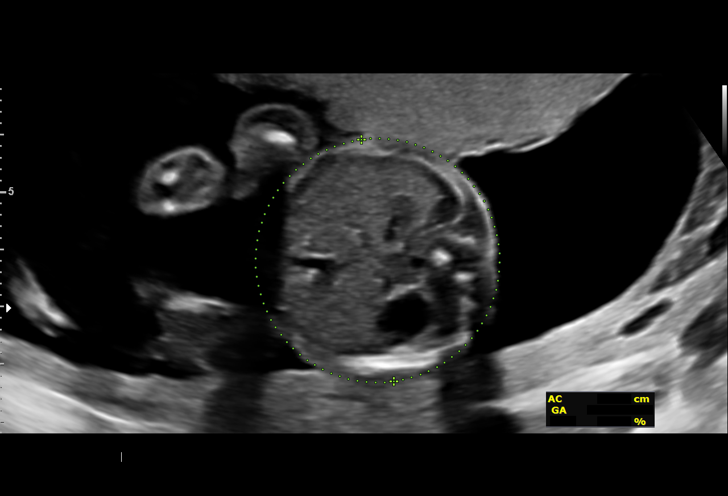
[im 83/141]
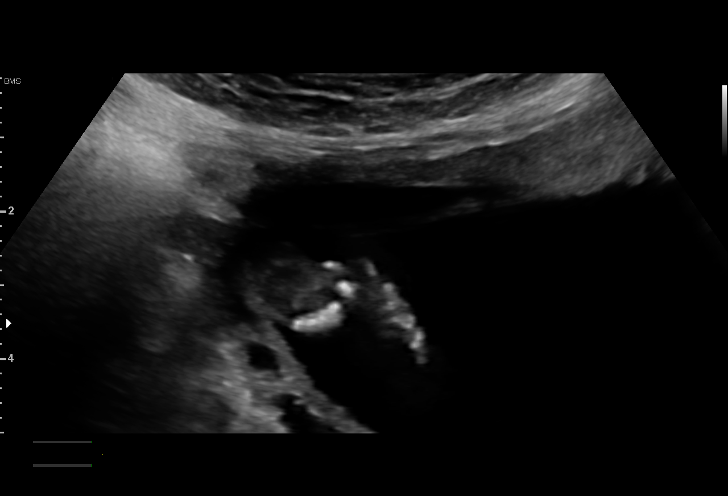
[im 94/141]
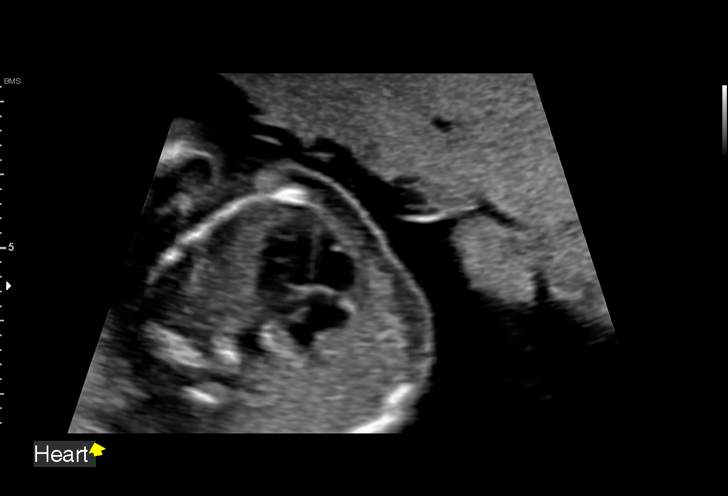
[im 104/141]
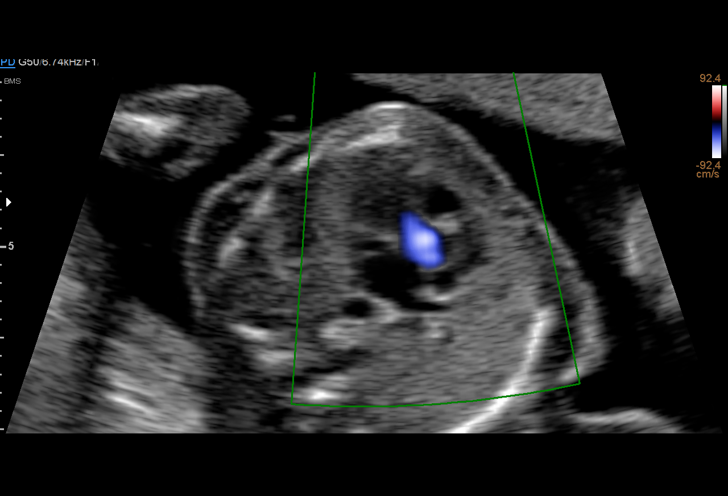
[im 115/141]
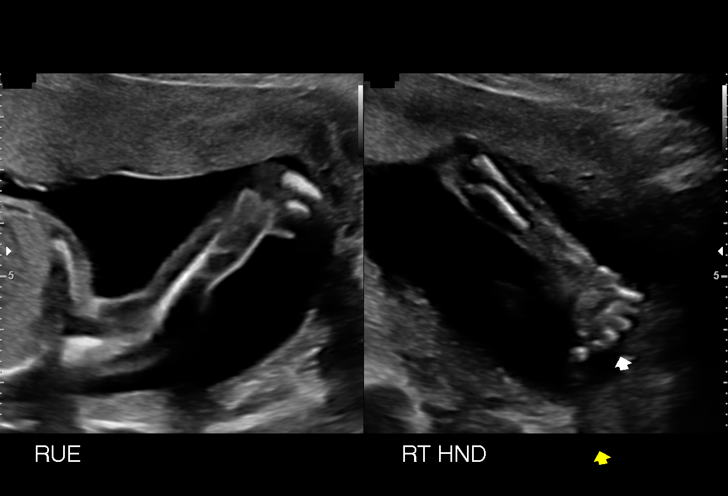
[im 125/141]
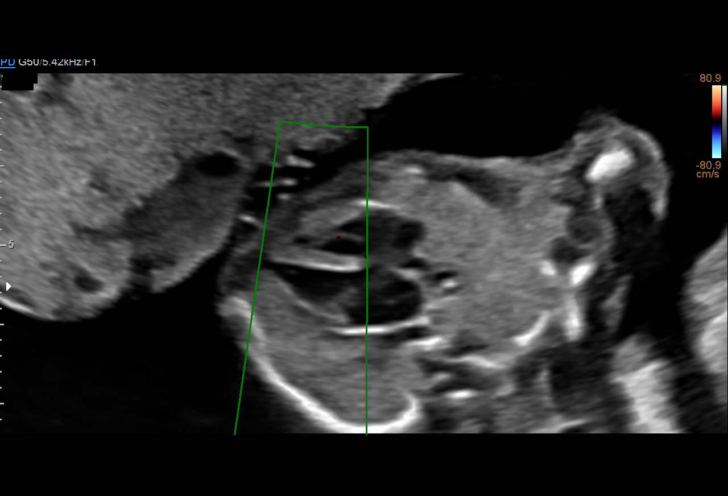
[im 135/141]
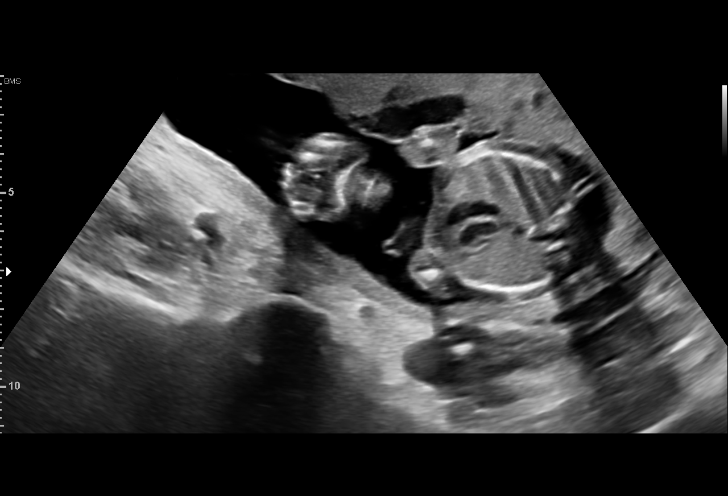

[13 of 28 positions shown; findings below may reference images not displayed]

Indications

 Poor obstetric history: Previous neonatal
 death (meconium aspiration)
 19 weeks gestation of pregnancy
 Encounter for antenatal screening for
 malformations
 LR NIPS, Neg [place]
Fetal Evaluation

 Num Of Fetuses:         1
 Fetal Heart Rate(bpm):  155
 Cardiac Activity:       Observed
 Presentation:           Cephalic
 Placenta:               Anterior
 P. Cord Insertion:      Visualized, central

 Amniotic Fluid
 AFI FV:      Within normal limits

                             Largest Pocket(cm)


 Comment:    Anterior placental edge 2.74 from internal os
Biometry

 BPD:      44.3  mm     G. Age:  19w 3d         49  %    CI:        71.67   %    70 - 86
                                                         FL/HC:      17.6   %    16.1 -
 HC:      166.6  mm     G. Age:  19w 2d         38  %    HC/AC:      1.24        1.09 -
 AC:      134.7  mm     G. Age:  19w 0d         29  %    FL/BPD:     66.4   %
 FL:       29.4  mm     G. Age:  19w 0d         29  %    FL/AC:      21.8   %    20 - 24
 CER:        20  mm     G. Age:  19w 2d         50  %
 NFT:       3.1  mm

 LV:        6.7  mm
 CM:        4.8  mm

 Est. FW:     271  gm    0 lb 10 oz      25  %
OB History

 Gravidity:    3
 Living:       1
Gestational Age

 LMP:           19w 3d        Date:  03/02/21                 EDD:   12/07/21
 U/S Today:     19w 1d                                        EDD:   12/09/21
 Best:          19w 3d     Det. By:  LMP  (03/02/21)          EDD:   12/07/21
Anatomy

 Cranium:               Appears normal         LVOT:                   Appears normal
 Cavum:                 Appears normal         Aortic Arch:            Appears normal
 Ventricles:            Appears normal         Ductal Arch:            Appears normal
 Choroid Plexus:        Appears normal         Diaphragm:              Appears normal
 Cerebellum:            Appears normal         Stomach:                Appears normal, left
                                                                       sided
 Posterior Fossa:       Appears normal         Abdomen:                Appears normal
 Nuchal Fold:           Appears normal         Abdominal Wall:         Appears nml (cord
                                                                       insert, abd wall)
 Face:                  Appears normal         Cord Vessels:           Appears normal (3
                        (orbits and profile)                           vessel cord)
 Lips:                  Appears normal         Kidneys:                Appear normal
 Palate:                Appears normal         Bladder:                Appears normal
 Thoracic:              Appears normal         Spine:                  Appears normal
 Heart:                 Appears normal         Upper Extremities:      Appears normal
                        (4CH, axis, and
                        situs)
 RVOT:                  Appears normal         Lower Extremities:      Appears normal

 Other:  Nasal bone and lenses visualized. Fetus appears to be female.
         Heels/feet and open hands/5th digits visualized.
Cervix Uterus Adnexa

 Cervix
 Length:           3.26  cm.
 Normal appearance by transabdominal scan.

 Uterus
 No abnormality visualized.

 Right Ovary
 Not visualized.

 Left Ovary
 Not visualized.
 Cul De Sac
 No free fluid seen.

 Adnexa
 No adnexal mass visualized.
Impression

 G3 [id number].  Patient is here for fetal anatomy scan.
 Obstetric history significant for and neonatal death in 6467
 and thick meconium stained liquor was seen.  Patient had
 postpartum hemorrhage and had D&C.

 On cell-free fetal DNA screening, the risks of fetal
 aneuploidies are not increased .

 We performed fetal anatomy scan. No makers of
 aneuploidies or fetal structural defects are seen. Fetal
 biometry is consistent with her previously-established dates.
 Amniotic fluid is normal and good fetal activity is seen.
 Patient understands the limitations of ultrasound in detecting
 fetal anomalies.
Recommendations

 -An appointment was made for her to return in 8 weeks for
 fetal growth assessment.
 -Fetal growth assessments every 4 weeks from her next visit.
                 Zhap, Mahamoud Dirieh

## 2021-08-08 ENCOUNTER — Other Ambulatory Visit: Payer: Self-pay

## 2021-08-08 ENCOUNTER — Ambulatory Visit (INDEPENDENT_AMBULATORY_CARE_PROVIDER_SITE_OTHER): Payer: 59 | Admitting: Family Medicine

## 2021-08-08 VITALS — BP 108/70 | HR 83 | Wt 139.6 lb

## 2021-08-08 DIAGNOSIS — Z8709 Personal history of other diseases of the respiratory system: Secondary | ICD-10-CM

## 2021-08-08 DIAGNOSIS — R300 Dysuria: Secondary | ICD-10-CM

## 2021-08-08 DIAGNOSIS — O0991 Supervision of high risk pregnancy, unspecified, first trimester: Secondary | ICD-10-CM

## 2021-08-08 DIAGNOSIS — R8271 Bacteriuria: Secondary | ICD-10-CM

## 2021-08-08 LAB — POCT URINALYSIS DIP (DEVICE)
Glucose, UA: NEGATIVE mg/dL
Hgb urine dipstick: NEGATIVE
Ketones, ur: 15 mg/dL — AB
Leukocytes,Ua: NEGATIVE
Nitrite: NEGATIVE
Protein, ur: NEGATIVE mg/dL
Specific Gravity, Urine: >=1.03 (ref 1.005–1.030)
Urobilinogen, UA: 0.2 mg/dL (ref 0.0–1.0)
pH: 6 (ref 5.0–8.0)

## 2021-08-08 NOTE — Progress Notes (Signed)
Patient stated that she think she has a "urinary infection" due to her having pain in pelvis before urinating along with pain in lower back and pain in left leg

## 2021-08-08 NOTE — Patient Instructions (Signed)

## 2021-08-09 NOTE — Progress Notes (Signed)
   PRENATAL VISIT NOTE  Subjective:  [name] is a 30 y.o. G3P2001 at [redacted]w[redacted]d being seen today for ongoing prenatal care.  She is currently monitored for the following issues for this low-risk pregnancy and has Supervision of high risk pregnancy in first trimester; History of retained placenta in prior pregnancy, currently pregnant; Prior delivery with meconium aspiration; GBS bacteriuria; and Loss of infant on their problem list.  Patient reports no complaints.  Contractions: Not present. Vag. Bleeding: None.  Movement: Present. Denies leaking of fluid.   The following portions of the patient's history were reviewed and updated as appropriate: allergies, current medications, past family history, past medical history, past social history, past surgical history and problem list.   Objective:   Vitals:   08/08/21 1038  BP: 108/70  Pulse: 83  Weight: 139 lb 9.6 oz (63.3 kg)    Fetal Status: Fetal Heart Rate (bpm): 132   Movement: Present     General:  Alert, oriented and cooperative. Patient is in no acute distress.  Skin: Skin is warm and dry. No rash noted.   Cardiovascular: Normal heart rate noted  Respiratory: Normal respiratory effort, no problems with respiration noted  Abdomen: Soft, gravid, appropriate for gestational age.  Pain/Pressure: Present     Pelvic: Cervical exam deferred        Extremities: Normal range of motion.  Edema: None  Mental Status: Normal mood and affect. Normal behavior. Normal judgment and thought content.  Urinalysis    Component Value Date/Time   COLORURINE STRAW (A) 09/01/2020 1607   APPEARANCEUR CLEAR 09/01/2020 1607   LABSPEC >=1.030 08/08/2021 1050   PHURINE 6.0 08/08/2021 1050   GLUCOSEU NEGATIVE 08/08/2021 1050   HGBUR NEGATIVE 08/08/2021 1050   BILIRUBINUR SMALL (A) 08/08/2021 1050   KETONESUR 15 (A) 08/08/2021 1050   PROTEINUR NEGATIVE 08/08/2021 1050   UROBILINOGEN 0.2 08/08/2021 1050   NITRITE NEGATIVE 08/08/2021 1050    LEUKOCYTESUR NEGATIVE 08/08/2021 1050     Assessment and Plan:  Pregnancy: G3P2001 at [redacted]w[redacted]d 1. Supervision of high risk pregnancy in first trimester Continue routine prenatal care.   2. Prior delivery with meconium aspiration   3. GBS bacteriuria Will need treatment in labor  4. Dysuria Urinalysis is negative check culture. - Culture, OB Urine  General obstetric precautions including but not limited to vaginal bleeding, contractions, leaking of fluid and fetal movement were reviewed in detail with the patient. Please refer to After Visit Summary for other counseling recommendations.   Return in 4 weeks (on 09/05/2021).  Future Appointments  Date Time Provider Department Center  09/05/2021 10:55 AM Federico Flake, MD [facility]  09/10/2021  2:30 PM [facility] NURSE [facility]  09/10/2021  2:45 PM [facility] US5 [facility]    Reva Bores, MD

## 2021-08-10 LAB — CULTURE, OB URINE

## 2021-08-10 LAB — URINE CULTURE, OB REFLEX

## 2021-09-05 ENCOUNTER — Other Ambulatory Visit: Payer: Self-pay

## 2021-09-05 ENCOUNTER — Ambulatory Visit (INDEPENDENT_AMBULATORY_CARE_PROVIDER_SITE_OTHER): Payer: 59 | Admitting: Family Medicine

## 2021-09-05 VITALS — BP 128/78 | HR 96 | Wt 141.6 lb

## 2021-09-05 DIAGNOSIS — O0991 Supervision of high risk pregnancy, unspecified, first trimester: Secondary | ICD-10-CM

## 2021-09-05 DIAGNOSIS — R052 Subacute cough: Secondary | ICD-10-CM

## 2021-09-05 DIAGNOSIS — Z23 Encounter for immunization: Secondary | ICD-10-CM

## 2021-09-05 DIAGNOSIS — O09299 Supervision of pregnancy with other poor reproductive or obstetric history, unspecified trimester: Secondary | ICD-10-CM

## 2021-09-05 DIAGNOSIS — Z8709 Personal history of other diseases of the respiratory system: Secondary | ICD-10-CM

## 2021-09-05 DIAGNOSIS — Z634 Disappearance and death of family member: Secondary | ICD-10-CM

## 2021-09-05 MED ORDER — DEXTROMETHORPHAN-GUAIFENESIN 10-100 MG/5ML PO SYRP: 5.0000 mL | ORAL_SOLUTION | Freq: Two times a day (BID) | ORAL | 1 refills | Status: DC

## 2021-09-05 MED ORDER — CETIRIZINE HCL 10 MG PO TABS: 10.0000 mg | ORAL_TABLET | Freq: Every day | ORAL | 11 refills | Status: DC

## 2021-09-05 NOTE — Progress Notes (Signed)
   PRENATAL VISIT NOTE  Subjective:  [name] is a 30 y.o. G3P2001 at [redacted]w[redacted]d being seen today for ongoing prenatal care.  She is currently monitored for the following issues for this high-risk pregnancy and has Supervision of high risk pregnancy in first trimester; History of retained placenta in prior pregnancy, currently pregnant; Prior delivery with meconium aspiration; GBS bacteriuria; and Loss of infant on their problem list.  Patient reports  cough  .  Contractions: Not present. Vag. Bleeding: None.  Movement: Present. Denies leaking of fluid.   The following portions of the patient's history were reviewed and updated as appropriate: allergies, current medications, past family history, past medical history, past social history, past surgical history and problem list.   Objective:   Vitals:   09/05/21 1112  BP: 128/78  Pulse: 96  Weight: 141 lb 9.6 oz (64.2 kg)    Fetal Status: Fetal Heart Rate (bpm): 143   Movement: Present     General:  Alert, oriented and cooperative. Patient is in no acute distress.  Skin: Skin is warm and dry. No rash noted.   Cardiovascular: Normal heart rate noted  Respiratory: Normal respiratory effort, no problems with respiration noted  Abdomen: Soft, gravid, appropriate for gestational age.  Pain/Pressure: Absent     Pelvic: Cervical exam deferred        Extremities: Normal range of motion.  Edema: None  Mental Status: Normal mood and affect. Normal behavior. Normal judgment and thought content.   Assessment and Plan:  Pregnancy: G3P2001 at [redacted]w[redacted]d 1. Supervision of high risk pregnancy in first trimester Up to date Needs 28 wk labs next visit  2. History of retained placenta in prior pregnancy, currently pregnant  3. Loss of infant  4. Prior delivery with meconium aspiration Continues to voice she wants a C-section I discussed that risks of C-section and again that C-section does not avoid the outcome she had regarding meconium  aspiration.  Discussed my need to coordinate with partners to assure her plan of care is appropriate The patients reports that "if you asked me to push I couldn't because I would be so anxious" I offered IBH to process her birth trauma and infant's death and she declined  5. Subacute cough - present for 1 week - cetirizine (ZYRTEC ALLERGY) 10 MG tablet; Take 1 tablet (10 mg total) by mouth daily.  Dispense: 30 tablet; Refill: 11 - Dextromethorphan-guaiFENesin 10-100 MG/5ML liquid; Take 5 mLs by mouth every 12 (twelve) hours.  Dispense: 236 mL; Refill: 1   Preterm labor symptoms and general obstetric precautions including but not limited to vaginal bleeding, contractions, leaking of fluid and fetal movement were reviewed in detail with the patient. Please refer to After Visit Summary for other counseling recommendations.   Return in about 2 weeks (around 09/19/2021) for Routine prenatal care, MD or APP, 28 wk labs.  Future Appointments  Date Time Provider Department Center  09/10/2021  2:30 PM [facility] NURSE [facility]  09/10/2021  2:45 PM [facility] US5 [facility]    Federico Flake, MD

## 2021-09-10 ENCOUNTER — Other Ambulatory Visit: Payer: Self-pay | Admitting: *Deleted

## 2021-09-10 ENCOUNTER — Other Ambulatory Visit: Payer: Self-pay

## 2021-09-10 ENCOUNTER — Encounter: Payer: Self-pay | Admitting: *Deleted

## 2021-09-10 ENCOUNTER — Ambulatory Visit: Payer: 59 | Attending: Obstetrics and Gynecology

## 2021-09-10 ENCOUNTER — Ambulatory Visit: Payer: 59 | Admitting: *Deleted

## 2021-09-10 VITALS — BP 100/64 | HR 68

## 2021-09-10 DIAGNOSIS — O09292 Supervision of pregnancy with other poor reproductive or obstetric history, second trimester: Secondary | ICD-10-CM

## 2021-09-10 DIAGNOSIS — O0991 Supervision of high risk pregnancy, unspecified, first trimester: Secondary | ICD-10-CM | POA: Diagnosis present

## 2021-09-10 DIAGNOSIS — Z362 Encounter for other antenatal screening follow-up: Secondary | ICD-10-CM | POA: Diagnosis present

## 2021-09-10 DIAGNOSIS — Z3A27 27 weeks gestation of pregnancy: Secondary | ICD-10-CM | POA: Diagnosis not present

## 2021-09-10 IMAGING — US US MFM OB FOLLOW-UP
1 series · 14 of 28 positions shown · non-contrast
Comparison: none

[Series 1: us mfm ob follow-up · 14 of 30 slices shown]
[im 2/30]
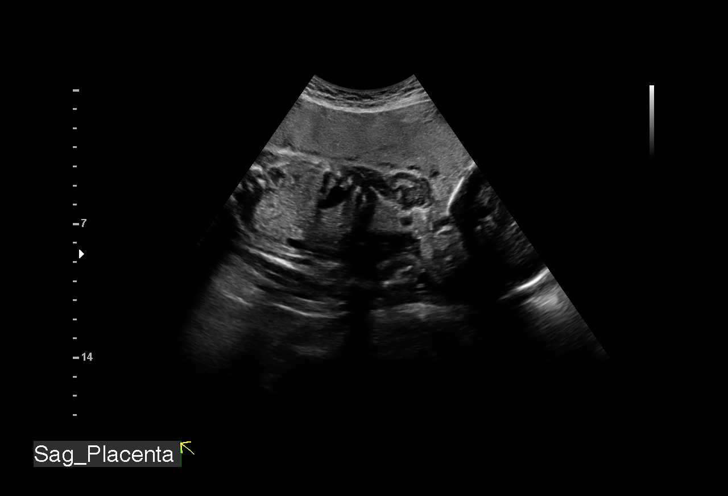
[im 4/30]
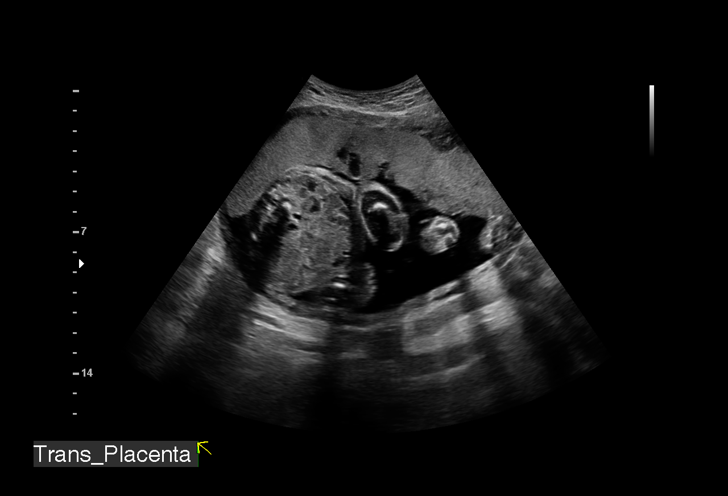
[im 6/30]
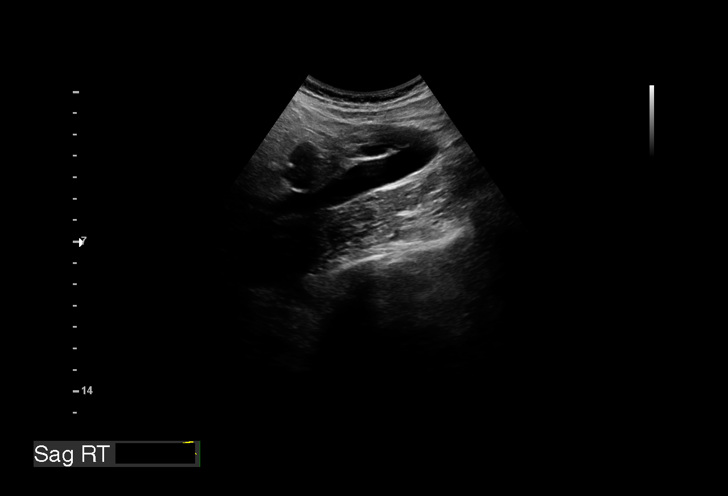
[im 8/30]
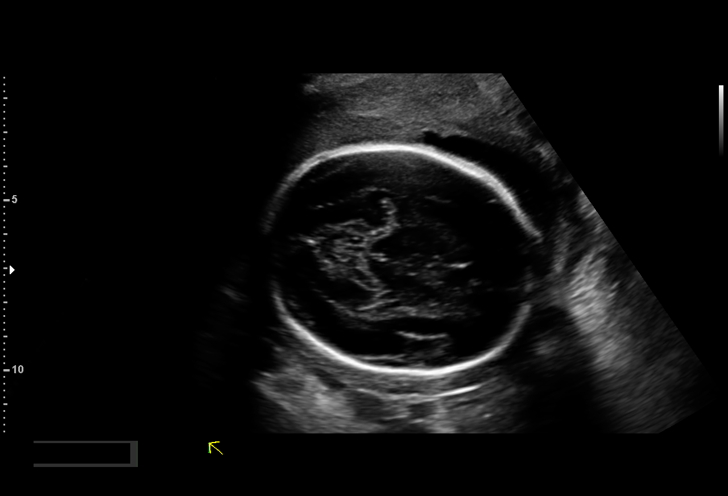
[im 10/30]
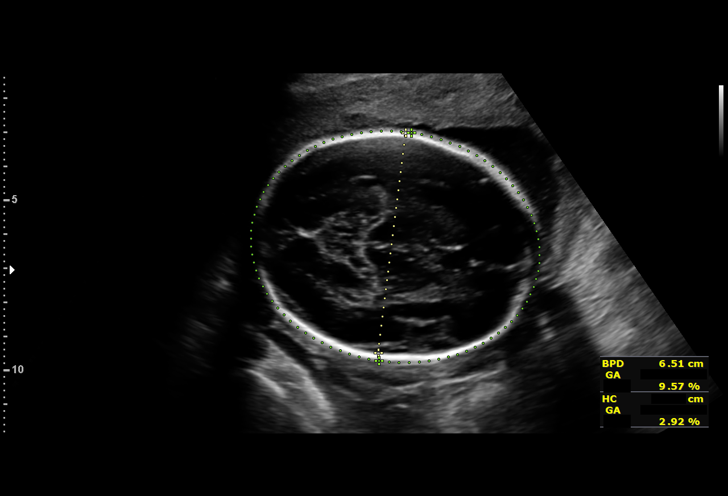
[im 12/30]
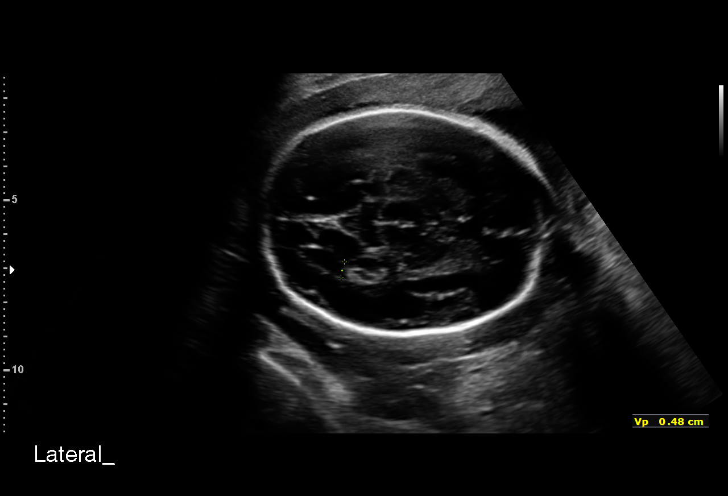
[im 14/30]
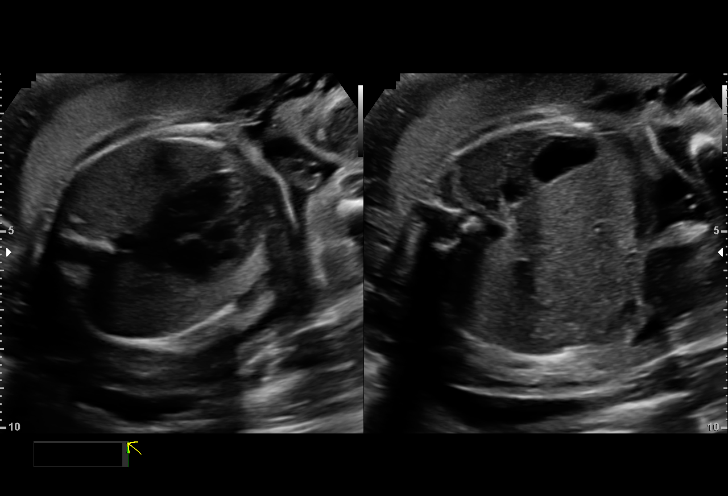
[im 17/30]
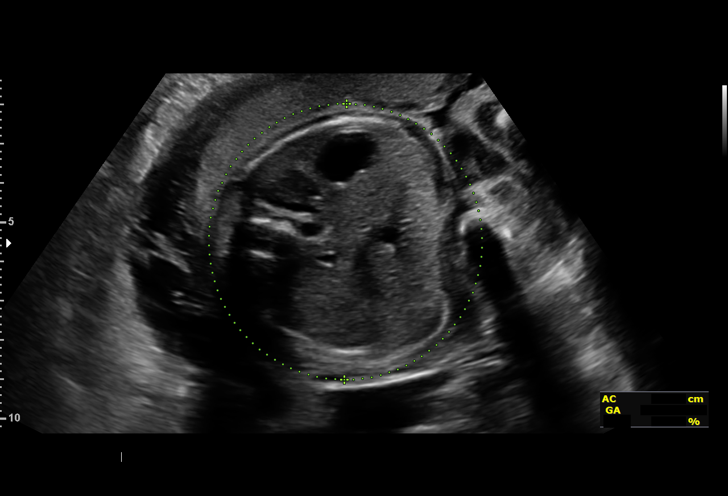
[im 19/30]
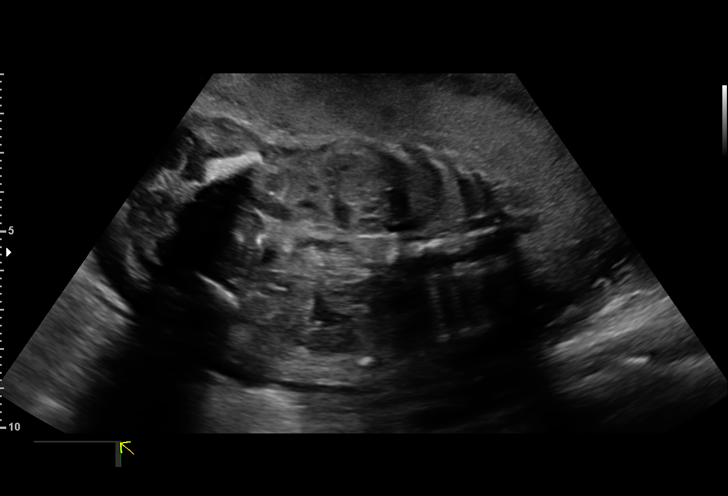
[im 21/30]
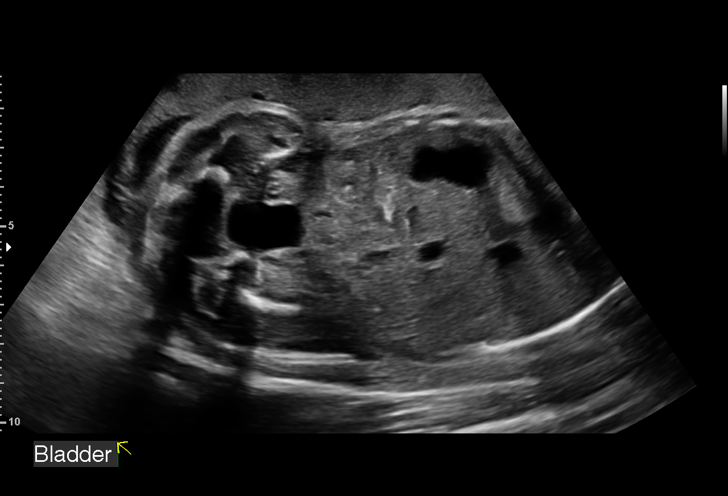
[im 23/30]
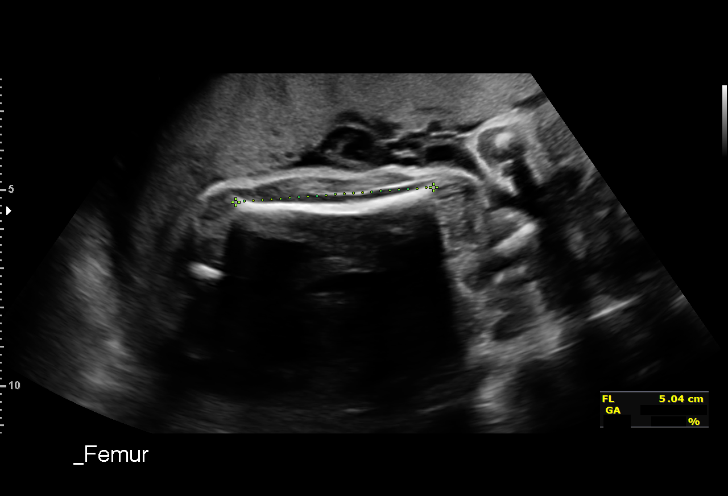
[im 25/30]
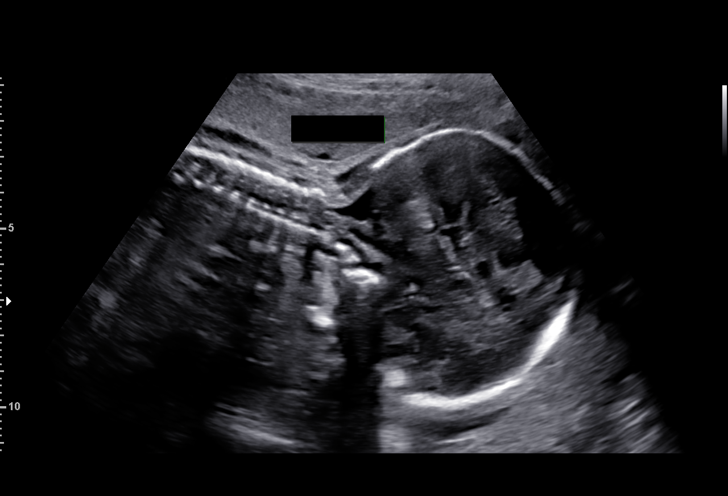
[im 27/30]
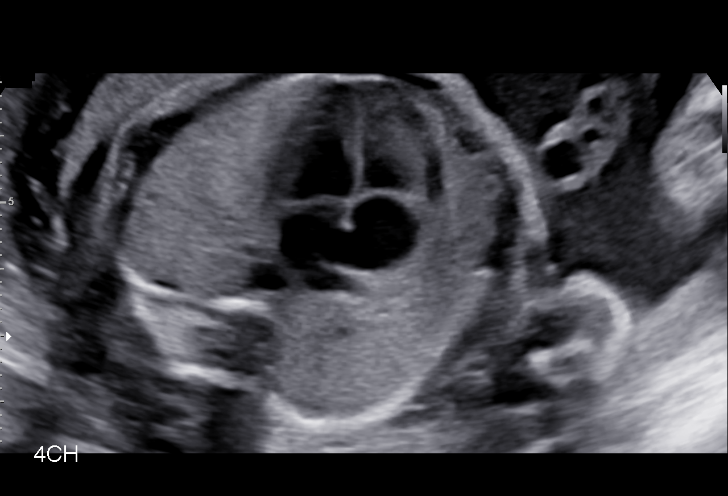
[im 30/30]
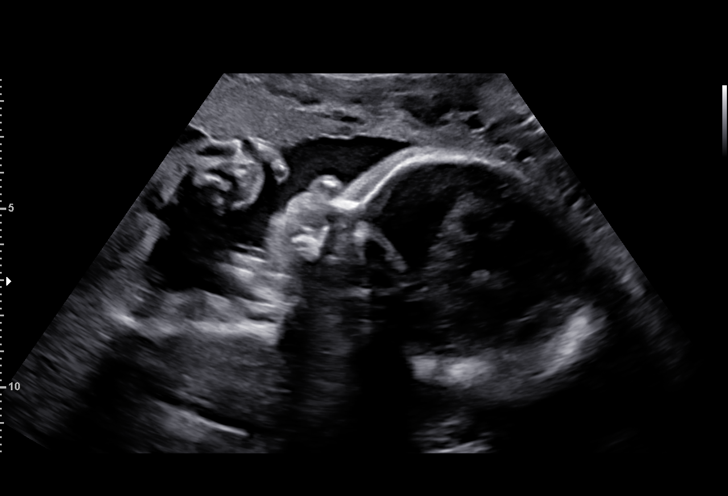

[14 of 28 positions shown; findings below may reference images not displayed]

Indications

 Poor obstetric history: Previous neonatal
 death (meconium aspiration)
 Encounter for other antenatal screening
 follow-up
 27 weeks gestation of pregnancy
 LR NIPS, Neg Horizon
Fetal Evaluation

 Num Of Fetuses:         1
 Fetal Heart Rate(bpm):  137
 Cardiac Activity:       Observed
 Presentation:           Cephalic
 Placenta:               Anterior
 P. Cord Insertion:      Visualized, central

 Amniotic Fluid
 AFI FV:      Within normal limits

 AFI Sum(cm)     %Tile       Largest Pocket(cm)
 12.29           30

 RUQ(cm)       RLQ(cm)       LUQ(cm)        LLQ(cm)

Biometry

 BPD:      65.3  mm     G. Age:  26w 3d         10  %    CI:        73.66   %    70 - 86
                                                         FL/HC:      20.9   %    18.6 -
 HC:      241.7  mm     G. Age:  26w 2d          3  %    HC/AC:      1.10        1.05 -
 AC:      219.9  mm     G. Age:  26w 3d         15  %    FL/BPD:     77.2   %    71 - 87
 FL:       50.4  mm     G. Age:  27w 0d         25  %    FL/AC:      22.9   %    20 - 24

 LV:        4.8  mm
 Est. FW:     963  gm      2 lb 2 oz     14  %
OB History

 Gravidity:    3
 Living:       1
Gestational Age

 LMP:           27w 3d        Date:  03/02/21                 EDD:   12/07/21
 U/S Today:     26w 4d                                        EDD:   12/13/21
 Best:          27w 3d     Det. By:  LMP  (03/02/21)          EDD:   12/07/21
Anatomy

 Cranium:               Appears normal         LVOT:                   Appears normal
 Cavum:                 Appears normal         Aortic Arch:            Previously seen
 Ventricles:            Appears normal         Ductal Arch:            Previously seen
 Choroid Plexus:        Previously seen        Diaphragm:              Appears normal
 Cerebellum:            Previously seen        Stomach:                Appears normal, left
                                                                       sided
 Posterior Fossa:       Previously seen        Abdomen:                Appears normal
 Nuchal Fold:           Previously seen        Abdominal Wall:         Previously seen
 Face:                  Orbits and profile     Cord Vessels:           Previously seen
                        previously seen
 Lips:                  Previously seen        Kidneys:                Appear normal
 Palate:                Previously seen        Bladder:                Appears normal
 Thoracic:              Appears normal         Spine:                  Previously seen
 Heart:                 Appears normal         Upper Extremities:      Previously seen
                        (4CH, axis, and
                        situs)
 RVOT:                  Appears normal         Lower Extremities:      Previously seen

 Other:  Nasal bone and lenses prev visualized. Fetus appears to be female.
         Heels/feet and open hands/5th digits prev visualized.
Cervix Uterus Adnexa

 Cervix
 Not visualized (advanced GA >24wks)

 Uterus
 No abnormality visualized.

 Right Ovary
 Not visualized.

 Left Ovary
 Within normal limits.

 Adnexa
 No adnexal mass visualized.
Comments

 This patient was seen for a follow up growth scan due to a
 prior history of a neonatal demise due to meconium
 aspiration.  She denies any problems since her last exam.
 She was informed that the fetal growth and amniotic fluid
 level appears appropriate for her gestational age.
 A follow up exam was scheduled in 4 weeks.

## 2021-09-19 ENCOUNTER — Other Ambulatory Visit: Payer: Self-pay | Admitting: *Deleted

## 2021-09-19 ENCOUNTER — Other Ambulatory Visit: Payer: Self-pay

## 2021-09-19 ENCOUNTER — Other Ambulatory Visit: Payer: 59

## 2021-09-19 DIAGNOSIS — O0990 Supervision of high risk pregnancy, unspecified, unspecified trimester: Secondary | ICD-10-CM

## 2021-09-20 ENCOUNTER — Ambulatory Visit (INDEPENDENT_AMBULATORY_CARE_PROVIDER_SITE_OTHER): Payer: 59 | Admitting: Obstetrics and Gynecology

## 2021-09-20 ENCOUNTER — Encounter: Payer: Self-pay | Admitting: Obstetrics and Gynecology

## 2021-09-20 ENCOUNTER — Other Ambulatory Visit: Payer: 59

## 2021-09-20 VITALS — BP 106/62 | HR 83 | Wt 141.9 lb

## 2021-09-20 DIAGNOSIS — R5383 Other fatigue: Secondary | ICD-10-CM

## 2021-09-20 DIAGNOSIS — R300 Dysuria: Secondary | ICD-10-CM

## 2021-09-20 DIAGNOSIS — R7989 Other specified abnormal findings of blood chemistry: Secondary | ICD-10-CM

## 2021-09-20 DIAGNOSIS — R8271 Bacteriuria: Secondary | ICD-10-CM

## 2021-09-20 DIAGNOSIS — O26893 Other specified pregnancy related conditions, third trimester: Secondary | ICD-10-CM

## 2021-09-20 DIAGNOSIS — Z3A28 28 weeks gestation of pregnancy: Secondary | ICD-10-CM

## 2021-09-20 DIAGNOSIS — O0990 Supervision of high risk pregnancy, unspecified, unspecified trimester: Secondary | ICD-10-CM

## 2021-09-20 DIAGNOSIS — Z8742 Personal history of other diseases of the female genital tract: Secondary | ICD-10-CM | POA: Insufficient documentation

## 2021-09-20 LAB — POCT URINALYSIS DIP (DEVICE)
Glucose, UA: NEGATIVE mg/dL
Hgb urine dipstick: NEGATIVE
Ketones, ur: NEGATIVE mg/dL
Leukocytes,Ua: NEGATIVE
Nitrite: NEGATIVE
Protein, ur: 30 mg/dL — AB
Specific Gravity, Urine: 1.025 (ref 1.005–1.030)
Urobilinogen, UA: 0.2 mg/dL (ref 0.0–1.0)
pH: 6.5 (ref 5.0–8.0)

## 2021-09-20 LAB — OB RESULTS CONSOLE GBS: GBS: POSITIVE

## 2021-09-20 NOTE — Progress Notes (Signed)
° ° °  PRENATAL VISIT NOTE  Subjective:  [name] is a 30 y.o. G3P2001 at [redacted]w[redacted]d being seen today for ongoing prenatal care.  She is currently monitored for the following issues for this high-risk pregnancy and has Supervision of high risk pregnancy in first trimester; History of retained placenta in prior pregnancy, currently pregnant; Prior delivery with meconium aspiration; GBS bacteriuria; Loss of infant; and History of postpartum endometritis on their problem list.  Patient reports  dysuria and tired during the day .  Contractions: Not present. Vag. Bleeding: None.  Movement: Present. Denies leaking of fluid.   The following portions of the patient's history were reviewed and updated as appropriate: allergies, current medications, past family history, past medical history, past social history, past surgical history and problem list.   Objective:   Vitals:   09/20/21 0907  BP: 106/62  Pulse: 83  Weight: 141 lb 14.4 oz (64.4 kg)    Fetal Status: Fetal Heart Rate (bpm): 155   Movement: Present     General:  Alert, oriented and cooperative. Patient is in no acute distress.  Skin: Skin is warm and dry. No rash noted.   Cardiovascular: Normal heart rate noted  Respiratory: Normal respiratory effort, no problems with respiration noted  Abdomen: Soft, gravid, appropriate for gestational age.  Pain/Pressure: Present     Pelvic: Cervical exam deferred        Extremities: Normal range of motion.  Edema: None  Mental Status: Normal mood and affect. Normal behavior. Normal judgment and thought content.   Assessment and Plan:  Pregnancy: G3P2001 at [redacted]w[redacted]d 1. Dysuria during pregnancy in third trimester - Culture, OB Urine  2. [redacted] weeks gestation of pregnancy 28wk labs today F/u 1/3 rpt growth u/s 12/5 at 14%, ac 15%, afi 12.3 - VITAMIN D 25 Hydroxy (Vit-D Deficiency, Fractures)  3. GBS bacteriuria Tx in labor  4. Lethargy Gets about 7-8 hours of sleep per night but does have to  get up during the night to void. Recommend limiting liquids at night. F/u 28wk labs and h/o low vitamin d per patient so will check too - VITAMIN D 25 Hydroxy (Vit-D Deficiency, Fractures)  5. H/o traumatic birth experience and neonatal demise Pt desires elective primary c-section. I d/w her that c/s is not 100% at eliminating risk to baby and has a slightly higher risk of TTN vs VD and risks with c/s like longer healing time, infection, bleeding, etc. Pt desires elective c/s which I am fine with. Request sent for 39wks  Preterm labor symptoms and general obstetric precautions including but not limited to vaginal bleeding, contractions, leaking of fluid and fetal movement were reviewed in detail with the patient. Please refer to After Visit Summary for other counseling recommendations.   Return in about 2 weeks (around 10/04/2021) for in person, md or app, low risk ob.  Future Appointments  Date Time Provider Department Center  10/09/2021  2:15 PM [facility] NURSE [facility]  10/09/2021  2:30 PM [facility] US3 [facility]    [place] Bing, MD

## 2021-09-20 NOTE — Progress Notes (Signed)
Pt states that she is having pain before & during urination, no odor nor blood.Pt also states has been feeling very tired.

## 2021-09-21 LAB — CBC
Hematocrit: 35.9 % (ref 34.0–46.6)
Hemoglobin: 12.2 g/dL (ref 11.1–15.9)
MCH: 31 pg (ref 26.6–33.0)
MCHC: 34 g/dL (ref 31.5–35.7)
MCV: 91 fL (ref 79–97)
Platelets: 147 10*3/uL — ABNORMAL LOW (ref 150–450)
RBC: 3.93 x10E6/uL (ref 3.77–5.28)
RDW: 11.8 % (ref 11.7–15.4)
WBC: 10.8 10*3/uL (ref 3.4–10.8)

## 2021-09-21 LAB — HIV ANTIBODY (ROUTINE TESTING W REFLEX): HIV Screen 4th Generation wRfx: NONREACTIVE

## 2021-09-21 LAB — RPR: RPR Ser Ql: NONREACTIVE

## 2021-09-21 LAB — VITAMIN D 25 HYDROXY (VIT D DEFICIENCY, FRACTURES): Vit D, 25-Hydroxy: 16.1 ng/mL — ABNORMAL LOW (ref 30.0–100.0)

## 2021-09-21 LAB — GLUCOSE TOLERANCE, 2 HOURS W/ 1HR
Glucose, 1 hour: 204 mg/dL — ABNORMAL HIGH (ref 70–179)
Glucose, 2 hour: 147 mg/dL (ref 70–152)
Glucose, Fasting: 84 mg/dL (ref 70–91)

## 2021-09-22 ENCOUNTER — Encounter: Payer: Self-pay | Admitting: Obstetrics and Gynecology

## 2021-09-22 DIAGNOSIS — D696 Thrombocytopenia, unspecified: Secondary | ICD-10-CM | POA: Insufficient documentation

## 2021-09-22 DIAGNOSIS — R7989 Other specified abnormal findings of blood chemistry: Secondary | ICD-10-CM

## 2021-09-22 DIAGNOSIS — O99119 Other diseases of the blood and blood-forming organs and certain disorders involving the immune mechanism complicating pregnancy, unspecified trimester: Secondary | ICD-10-CM | POA: Insufficient documentation

## 2021-09-22 DIAGNOSIS — O24419 Gestational diabetes mellitus in pregnancy, unspecified control: Secondary | ICD-10-CM

## 2021-09-22 HISTORY — DX: Other specified abnormal findings of blood chemistry: R79.89

## 2021-09-22 HISTORY — DX: Other diseases of the blood and blood-forming organs and certain disorders involving the immune mechanism complicating pregnancy, unspecified trimester: D69.6

## 2021-09-22 HISTORY — DX: Gestational diabetes mellitus in pregnancy, unspecified control: O24.419

## 2021-09-22 MED ORDER — VITAMIN D (ERGOCALCIFEROL) 1.25 MG (50000 UNIT) PO CAPS: 50000.0000 [IU] | ORAL_CAPSULE | ORAL | 0 refills | Status: AC

## 2021-09-22 NOTE — Addendum Note (Signed)
Addended by: [place] Bing on: 09/22/2021 02:47 PM   Modules accepted: Orders

## 2021-09-25 ENCOUNTER — Encounter: Payer: 59 | Attending: Obstetrics and Gynecology | Admitting: Registered"

## 2021-09-25 ENCOUNTER — Telehealth: Payer: Self-pay

## 2021-09-25 ENCOUNTER — Other Ambulatory Visit: Payer: Self-pay

## 2021-09-25 ENCOUNTER — Ambulatory Visit: Payer: 59 | Admitting: Registered"

## 2021-09-25 DIAGNOSIS — O24410 Gestational diabetes mellitus in pregnancy, diet controlled: Secondary | ICD-10-CM

## 2021-09-25 DIAGNOSIS — Z3A00 Weeks of gestation of pregnancy not specified: Secondary | ICD-10-CM | POA: Diagnosis not present

## 2021-09-25 LAB — CULTURE, OB URINE

## 2021-09-25 LAB — URINE CULTURE, OB REFLEX

## 2021-09-25 MED ORDER — ACCU-CHEK GUIDE W/DEVICE KIT: 1.0000 | PACK | Freq: Once | 0 refills | Status: AC

## 2021-09-25 MED ORDER — ACCU-CHEK GUIDE VI STRP: ORAL_STRIP | 6 refills | Status: DC

## 2021-09-25 MED ORDER — ACCU-CHEK SOFTCLIX LANCETS MISC: 6 refills | Status: DC

## 2021-09-25 MED ORDER — CEFADROXIL 500 MG PO CAPS: 500.0000 mg | ORAL_CAPSULE | Freq: Two times a day (BID) | ORAL | 0 refills | Status: DC

## 2021-09-25 NOTE — Addendum Note (Signed)
Addended by: [place] Bing on: 09/25/2021 10:53 AM   Modules accepted: Orders

## 2021-09-25 NOTE — Progress Notes (Signed)
In-person Interpreter from [employer]  Patient was seen for Gestational Diabetes self-management on 09/25/21  Start time 1600 and End time 1655   Estimated due date: 12/07/21  Clinical: Medications: reviewed Medical History: reviewed Labs: OGTT 84-204(H)-147, A1c 5.5%   Dietary and Lifestyle History:   Physical Activity: 3x/week 20 min Stress: not assessed Sleep: not assessed  24 hr Recall:  First Meal: whole milk with tea, cookies Snack: Second meal: Snack: Third meal: Snack: Beverages:  NUTRITION INTERVENTION  Nutrition education (E-1) on the following topics:   Initial Follow-up  [x]  []  Definition of Gestational Diabetes [x]  []  Why dietary management is important in controlling blood glucose [x]  []  Effects each nutrient has on blood glucose levels []  []  Simple carbohydrates vs complex carbohydrates []  []  Fluid intake [x]  []  Creating a balanced meal plan [x]  []  Carbohydrate counting  [x]  []  When to check blood glucose levels [x]  []  Proper blood glucose monitoring techniques [x]  []  Effect of stress and stress reduction techniques  [x]  []  Exercise effect on blood glucose levels, appropriate exercise during pregnancy []  []  Importance of limiting caffeine and abstaining from alcohol and smoking []  []  Medications used for blood sugar control during pregnancy []  []  Hypoglycemia and rule of 15 []  []  Postpartum self care  Patient already has a meter, is received instruction for testing pre breakfast and 2 hours after each meal. CBG: 100 mg/dL  Patient instructed to monitor glucose levels: FBS: 60 - ? 95 mg/dL (some clinics use 90 for cutoff) 1 hour: ? 140 mg/dL 2 hour: ? mg/dL  Patient received handouts: Nutrition Diabetes and Pregnancy Carbohydrate Counting List  Patient will be seen for follow-up as needed.

## 2021-09-25 NOTE — Telephone Encounter (Addendum)
-----   Message from [place] Bing, MD sent at 09/25/2021 10:52 AM EST ----- Can you let her know that she has a UTI and that I've sent in antibiotics for her? Thanks  Patient here in the office for diabetes education appt. Results reviewed with patient. Patient has already picked up antibiotic from pharmacy.

## 2021-09-25 NOTE — Telephone Encounter (Addendum)
-----   Message from [place] Bing, MD sent at 09/22/2021  2:43 PM EST ----- Please let her know that she has diabetes in pregnancy and set her up with teaching, supplies, etc. Also, please let her know that her platelets are slightly low but we'll keep eye on it with platelet counts every few weeks and nothing to be concerned about. Thanks  ----------------------------------------------------  Called pt; results and provider recommendation given. Supplies ordered for patient to pick up prior to appt today at 4 PM with diabetes education.

## 2021-09-27 ENCOUNTER — Other Ambulatory Visit: Payer: Self-pay

## 2021-10-04 ENCOUNTER — Other Ambulatory Visit: Payer: Self-pay | Admitting: Family Medicine

## 2021-10-04 DIAGNOSIS — Z98891 History of uterine scar from previous surgery: Secondary | ICD-10-CM

## 2021-10-04 NOTE — Progress Notes (Signed)
Primary CS desired due to poor fetal outcome last pregnancy (meconium aspiration with neonatal death)

## 2021-10-07 NOTE — L&D Delivery Note (Signed)
LABOR COURSE Patient was admitted for IOL 2/2 A1GDM. Her cervix was ripened with Cytotec. She was induced with low dose Pitocin. She progressed to complete without further intervention.  Delivery Note Called to room and patient was complete and pushing. Head delivered LOT. No nuchal cord present. Shoulder and body delivered in usual fashion. At 2201 a viable female was delivered via Vaginal, Spontaneous (Presentation: LOT; LOA).  Baby born entirely en caul.  Infant with spontaneous cry, placed on mother's abdomen, dried and stimulated. Cord clamped x 2 after two-minute delay, and cut by FOB. Cord pale and pulseless prior to cutting. Cord blood drawn.   Placenta delivered spontaneously with gentle cord traction. Placenta noted to have semicircular gap in the middle of placenta. Tissue appears intact. Manual sweep performed, multiple small clots delivered. TXA requested. No placental tissue within clots. Fundus firm with massage and Pitocin. Dr. Rip Harbour requested at bedside. Bedside ultrasound performed by Dr. Rip Harbour. Small additional clot delivered with additional manual sweep by CNM. Labia, perineum, vagina, and cervix inspected.  800 mcg Cytotec placed per rectum per recommendation by Dr. Rip Harbour.    APGAR:9 ; 9;  weight: 2930g .   Cord: 3VC with the following complications:N/A.    Anesthesia:  Epidural Episiotomy: None Lacerations: intact Est. Blood Loss (mL): 100  Mom to postpartum.  Baby to Couplet care / Skin to Skin. Placenta to Pathology  Mallie Snooks, [place] 11/30/21 10:43 PM

## 2021-10-09 ENCOUNTER — Ambulatory Visit: Payer: Medicaid Other | Admitting: *Deleted

## 2021-10-09 ENCOUNTER — Ambulatory Visit: Payer: Medicaid Other | Attending: Obstetrics

## 2021-10-09 ENCOUNTER — Other Ambulatory Visit: Payer: Self-pay

## 2021-10-09 ENCOUNTER — Other Ambulatory Visit: Payer: Self-pay | Admitting: *Deleted

## 2021-10-09 VITALS — BP 108/67 | HR 82

## 2021-10-09 DIAGNOSIS — O09292 Supervision of pregnancy with other poor reproductive or obstetric history, second trimester: Secondary | ICD-10-CM | POA: Insufficient documentation

## 2021-10-09 DIAGNOSIS — O24410 Gestational diabetes mellitus in pregnancy, diet controlled: Secondary | ICD-10-CM

## 2021-10-09 DIAGNOSIS — O09293 Supervision of pregnancy with other poor reproductive or obstetric history, third trimester: Secondary | ICD-10-CM

## 2021-10-09 DIAGNOSIS — O0991 Supervision of high risk pregnancy, unspecified, first trimester: Secondary | ICD-10-CM | POA: Insufficient documentation

## 2021-10-09 DIAGNOSIS — O24419 Gestational diabetes mellitus in pregnancy, unspecified control: Secondary | ICD-10-CM

## 2021-10-09 DIAGNOSIS — Z3A31 31 weeks gestation of pregnancy: Secondary | ICD-10-CM

## 2021-10-09 DIAGNOSIS — Z362 Encounter for other antenatal screening follow-up: Secondary | ICD-10-CM

## 2021-10-09 IMAGING — US US MFM OB FOLLOW-UP
1 series · 14 of 26 positions shown · non-contrast
Comparison: none

[Series 1: us mfm ob follow-up · 26 acquisitions, 14 frames shown]
[im 1/26]
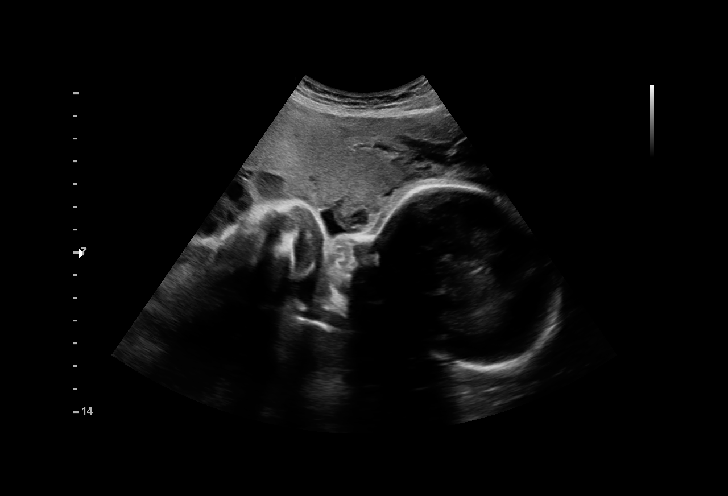
[im 3/26]
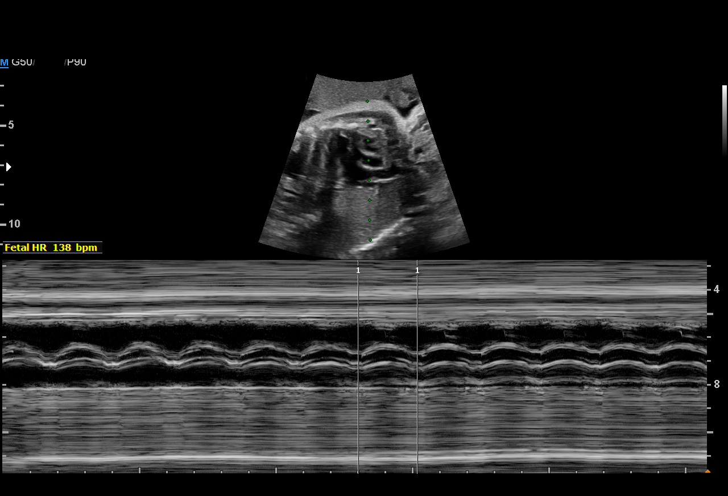
[im 5/26]
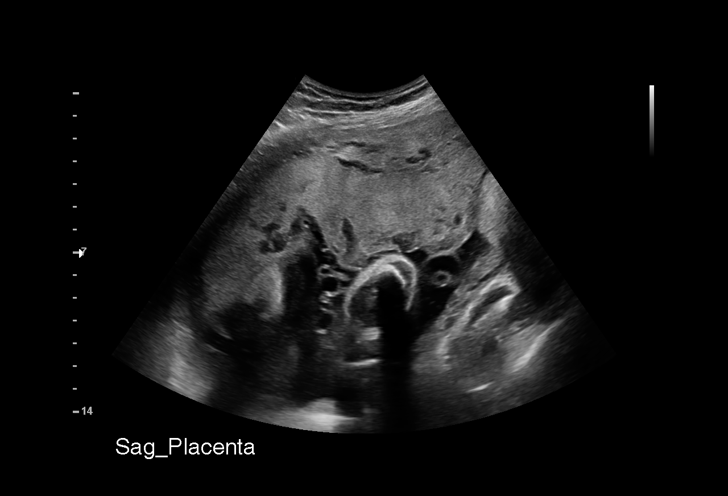
[im 7/26]
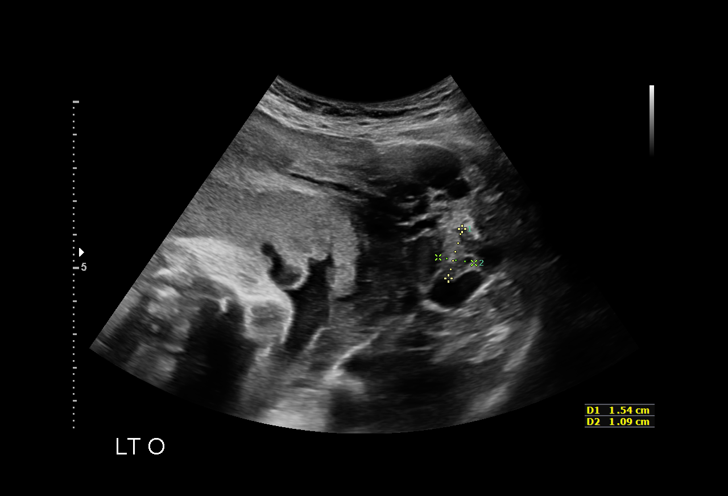
[im 9/26]
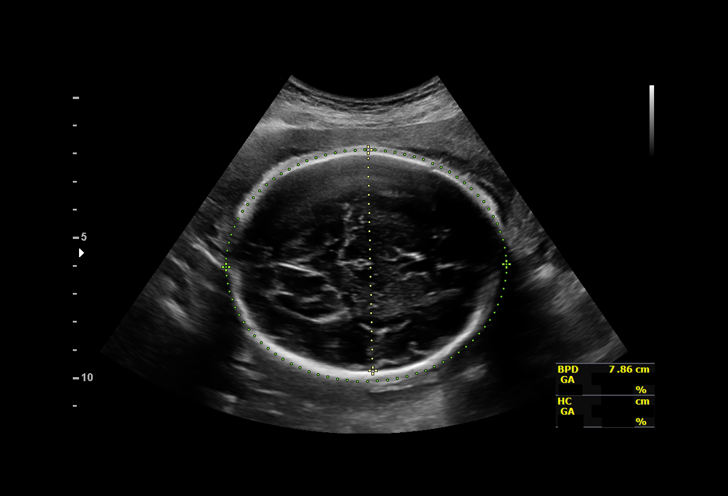
[im 11/26]
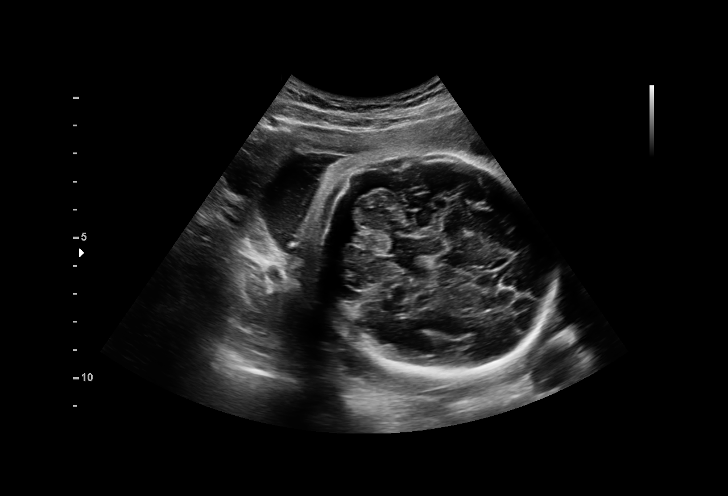
[im 13/26]
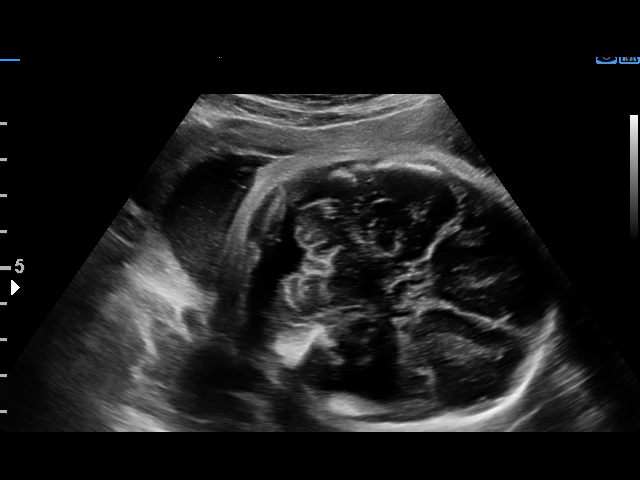
[im 14/26]
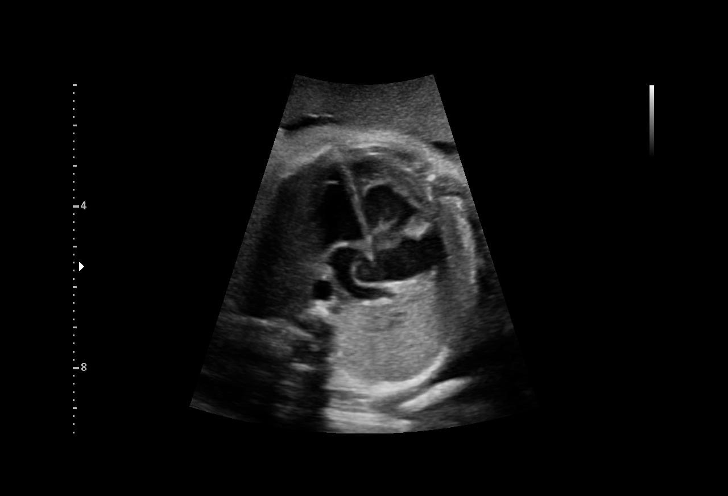
[im 16/26]
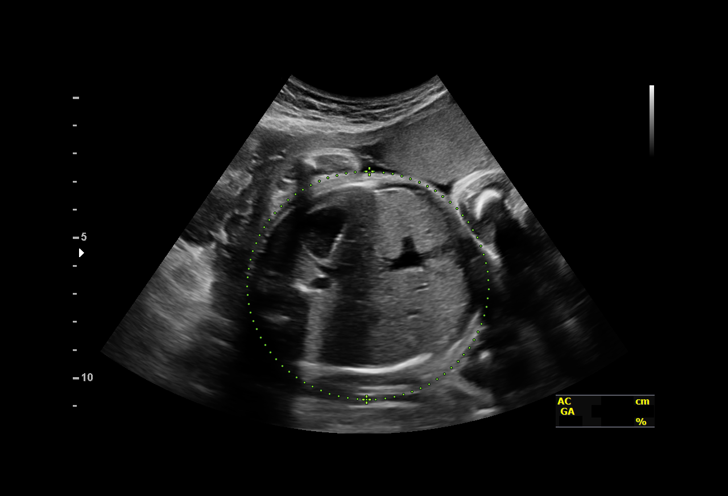
[im 18/26]
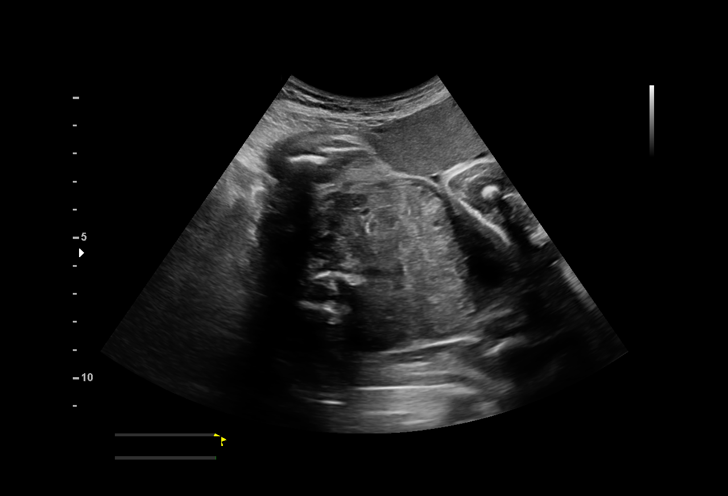
[im 20/26]
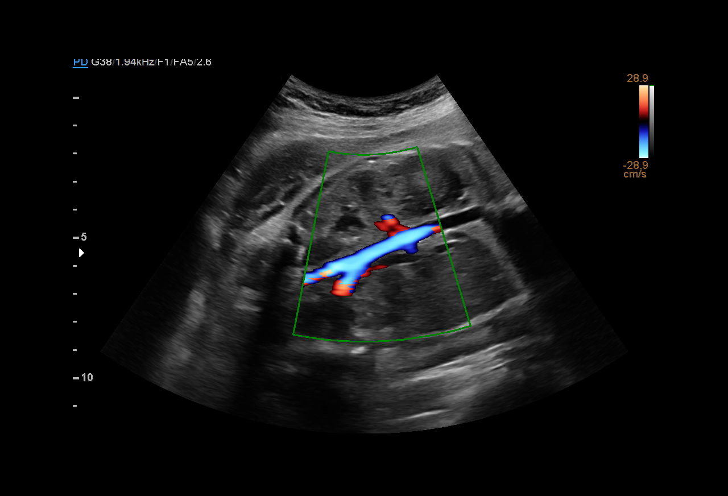
[im 22/26]
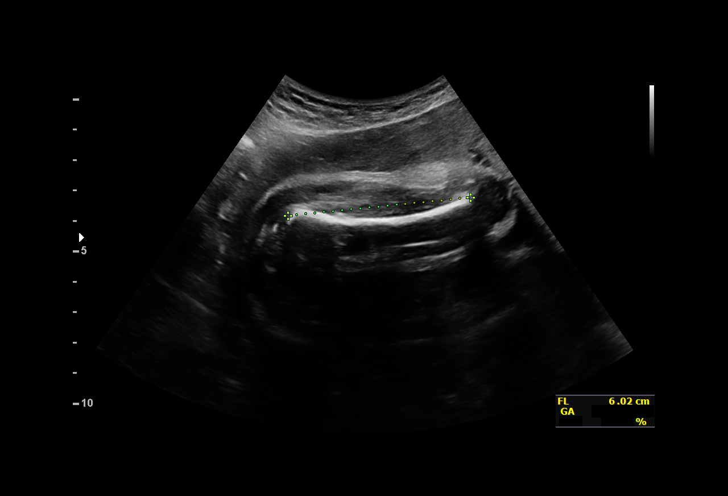
[im 24/26]
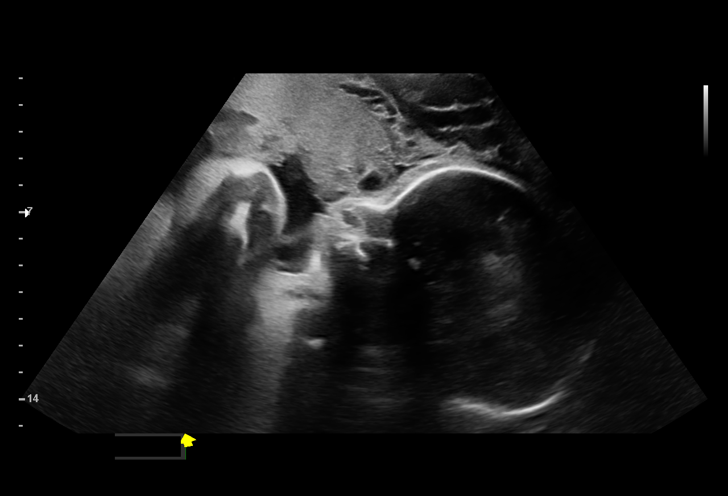
[im 26/26]
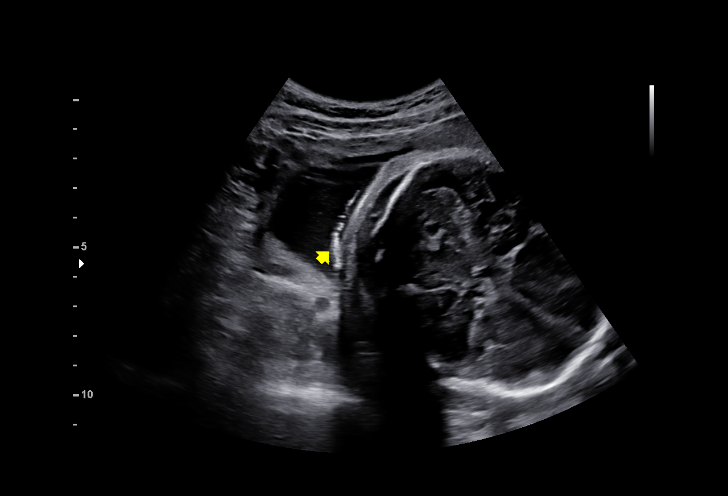

[14 of 26 positions shown; findings below may reference images not displayed]

Indications

 Gestational diabetes in pregnancy, diet
 controlled
 31 weeks gestation of pregnancy
 Poor obstetric history: Previous neonatal
 death (meconium aspiration)
 Encounter for other antenatal screening
 follow-up
 LR NIPS, Neg Horizon
Fetal Evaluation

 Num Of Fetuses:         1
 Fetal Heart Rate(bpm):  138
 Cardiac Activity:       Observed
 Presentation:           Cephalic
 Placenta:               Anterior
 P. Cord Insertion:      Previously Visualized

 Amniotic Fluid
 AFI FV:      Within normal limits

 AFI Sum(cm)     %Tile       Largest Pocket(cm)
 11.11           24

 RUQ(cm)       RLQ(cm)       LUQ(cm)        LLQ(cm)

Biometry
 BPD:      78.6  mm     G. Age:  31w 4d         39  %    CI:        74.74   %    70 - 86
                                                         FL/HC:      20.8   %    19.1 -
 HC:      288.5  mm     G. Age:  31w 5d         18  %    HC/AC:      1.10        0.96 -
 AC:      262.8  mm     G. Age:  30w 3d         17  %    FL/BPD:     76.2   %    71 - 87
 FL:       59.9  mm     G. Age:  31w 1d         26  %    FL/AC:      22.8   %    20 - 24
 LV:        3.5  mm

 Est. FW:    4949  gm    3 lb 10 oz      19  %
OB History

 Gravidity:    3
 Living:       1
Gestational Age

 LMP:           31w 4d        Date:  03/02/21                 EDD:   12/07/21
 U/S Today:     31w 2d                                        EDD:   12/09/21
 Best:          31w 4d     Det. By:  LMP  (03/02/21)          EDD:   12/07/21
Anatomy

 Cranium:               Appears normal         LVOT:                   Previously seen
 Cavum:                 Previously seen        Aortic Arch:            Previously seen
 Ventricles:            Appears normal         Ductal Arch:            Previously seen
 Choroid Plexus:        Previously seen        Diaphragm:              Previously seen
 Cerebellum:            Previously seen        Stomach:                Appears normal, left
                                                                       sided
 Posterior Fossa:       Previously seen        Abdomen:                Previously seen
 Nuchal Fold:           Previously seen        Abdominal Wall:         Previously seen
 Face:                  Orbits and profile     Cord Vessels:           Previously seen
                        previously seen
 Lips:                  Previously seen        Kidneys:                Appear normal
 Palate:                Previously seen        Bladder:                Appears normal
 Thoracic:              Previously seen        Spine:                  Previously seen
 Heart:                 Appears normal         Upper Extremities:      Previously seen
                        (4CH, axis, and
                        situs)
 RVOT:                  Previously seen        Lower Extremities:      Previously seen

 Other:  Nasal bone and lenses prev visualized. Fetus appears to be female.
         Heels/feet and open hands/5th digits prev visualized.
Cervix Uterus Adnexa

 Cervix
 Not visualized (advanced GA >47wks)

 Right Ovary
 Visualized.

 Left Ovary
 Visualized.
Comments

 This patient was seen for a follow up growth scan due to a
 prior history of a neonatal demise due to meconium
 aspiration.  The patient is very nervous regarding another
 adverse outcome.  She denies any problems since her last
 exam.
 She was informed that the fetal growth and amniotic fluid
 level appears appropriate for her gestational age.
 Due to her history of a prior demise, we will continue to follow
 her with weekly BPP's.
 A BPP was scheduled in 1 week.

## 2021-10-09 NOTE — Progress Notes (Signed)
Pt still c/o lower abd pain before and after she voids. She finished her antibiotic new years eve. Encouraged her to notify her Dr. Today.

## 2021-10-11 ENCOUNTER — Other Ambulatory Visit: Payer: Self-pay

## 2021-10-11 ENCOUNTER — Ambulatory Visit (INDEPENDENT_AMBULATORY_CARE_PROVIDER_SITE_OTHER): Payer: Medicaid Other | Admitting: Advanced Practice Midwife

## 2021-10-11 VITALS — BP 113/60 | HR 78 | Wt 142.0 lb

## 2021-10-11 DIAGNOSIS — R300 Dysuria: Secondary | ICD-10-CM

## 2021-10-11 DIAGNOSIS — Z634 Disappearance and death of family member: Secondary | ICD-10-CM

## 2021-10-11 DIAGNOSIS — F419 Anxiety disorder, unspecified: Secondary | ICD-10-CM

## 2021-10-11 DIAGNOSIS — O26893 Other specified pregnancy related conditions, third trimester: Secondary | ICD-10-CM

## 2021-10-11 DIAGNOSIS — O24410 Gestational diabetes mellitus in pregnancy, diet controlled: Secondary | ICD-10-CM

## 2021-10-11 DIAGNOSIS — O0993 Supervision of high risk pregnancy, unspecified, third trimester: Secondary | ICD-10-CM

## 2021-10-11 DIAGNOSIS — Z9189 Other specified personal risk factors, not elsewhere classified: Secondary | ICD-10-CM

## 2021-10-11 NOTE — Progress Notes (Signed)
° °  PRENATAL VISIT NOTE  Subjective:  [name] is a 31 y.o. G3P2001 at [redacted]w[redacted]d being seen today for ongoing prenatal care.  She is currently monitored for the following issues for this high-risk pregnancy and has Supervision of high risk pregnancy in first trimester; History of retained placenta in prior pregnancy, currently pregnant; Prior delivery with meconium aspiration; GBS bacteriuria; Loss of infant; History of postpartum endometritis; Gestational thrombocytopenia (HCC); GDM (gestational diabetes mellitus); and Low vitamin D level on their problem list.  Patient reports  intermittent pain with initiation of voiding and completion of voiding . She completed treatment for UTI as prescribed in December but states she has never been without some degree of pain with voiding. She denies hematuria, flank pain, fever. Contractions: Not present. Vag. Bleeding: None.  Movement: Present. Denies leaking of fluid.   Patient states to CNM she is "ready to speak to a psychologist" about her feelings regarding hx newborn loss, impending birth of her baby.   The following portions of the patient's history were reviewed and updated as appropriate: allergies, current medications, past family history, past medical history, past social history, past surgical history and problem list. Problem list updated.  Objective:   Vitals:   10/11/21 0941  BP: 113/60  Pulse: 78  Weight: 142 lb (64.4 kg)    Fetal Status: Fetal Heart Rate (bpm): 146 Fundal Height: 29 cm Movement: Present     General:  Alert, oriented and cooperative. Patient is in no acute distress.  Skin: Skin is warm and dry. No rash noted.   Cardiovascular: Normal heart rate noted  Respiratory: Normal respiratory effort, no problems with respiration noted  Abdomen: Soft, gravid, appropriate for gestational age.  Pain/Pressure: Absent     Pelvic: Cervical exam deferred        Extremities: Normal range of motion.  Edema: None  Mental Status:  Normal mood and affect. Normal behavior. Normal judgment and thought content.   Assessment and Plan:  Pregnancy: G3P2001 at [redacted]w[redacted]d  1. Supervision of high risk pregnancy in third trimester - Routine care  2. Dysuria during pregnancy in third trimester - Will send culture - Pertinent negatives: fever, hematuria, flank pain, CVAT  3. Diet controlled gestational diabetes mellitus (GDM) in third trimester - No log today. Normal readings per patient. FH appropriate  4. Loss of infant - Amb referral to [facility] per patient request - Cesarean scheduled with Dr. Ernestina Patches on 11/30/2021  5. At increased risk for language barrier - Conversational English fluency - Declines interpreter for today's appointment  Preterm labor symptoms and general obstetric precautions including but not limited to vaginal bleeding, contractions, leaking of fluid and fetal movement were reviewed in detail with the patient. Please refer to After Visit Summary for other counseling recommendations.  Return in about 2 weeks (around 10/25/2021) for App or MD.  Future Appointments  Date Time Provider [place]  10/18/2021  1:15 PM [facility] NST [facility]  10/23/2021 10:55 AM Genia Del, MD [facility]  10/24/2021  1:00 PM [facility] NURSE [facility]  10/24/2021  1:15 PM [facility] US2 [facility]  10/25/2021  2:45 PM [facility]  11/01/2021  1:15 PM [facility] NST [facility]  11/07/2021  1:30 PM [facility] NURSE [facility]  11/07/2021  1:45 PM [facility] US6 [facility] [place], CNM

## 2021-10-11 NOTE — Progress Notes (Signed)
Patient only wants interpreter on Ipad

## 2021-10-12 NOTE — BH Specialist Note (Signed)
Integrated Behavioral Health via Telemedicine Visit  10/12/2021 Melika Reder [id number]  Number of Integrated Behavioral Health visits: 1 Session Start time: 2:45  Session End time: 3:29 Total time:  37  Referring Provider: Clayton Bibles, CNM Patient/Family location: Home [facility] Provider location: [facility]  All persons participating in visit: Patient [name] and Arabic interpreter and [facility] Rosella Crandell   Types of Service: Individual psychotherapy and Video visit  I connected with Jule Ser and/or Torrin Mcgann's  n/a  via  Telephone or Video Enabled Telemedicine Application  (Video is Caregility application) and verified that I am speaking with the correct person using two identifiers. Discussed confidentiality: Yes   I discussed the limitations of telemedicine and the availability of in person appointments.  Discussed there is a possibility of technology failure and discussed alternative modes of communication if that failure occurs.  I discussed that engaging in this telemedicine visit, they consent to the provision of behavioral healthcare and the services will be billed under their insurance.  Patient and/or legal guardian expressed understanding and consented to Telemedicine visit: Yes   Presenting Concerns: Patient and/or family reports the following symptoms/concerns: Traumatic birth experience at last birth (not familiar with medical providers; baby passed after birth/NICU); increasing anxiety as scheduled cesarean gets closer; keeping busy to cope with anxious feelings. Duration of problem: Increase in current pregnancy; Severity of problem:  moderately severe  Patient and/or Family's Strengths/Protective Factors: Social connections, Concrete supports in place (healthy food, safe environments, etc.), Sense of purpose, and Physical Health (exercise, healthy diet, medication compliance, etc.)  Goals  Addressed: Patient will:  Reduce symptoms of: anxiety   Demonstrate ability to: Increase healthy adjustment to current life circumstances  Progress towards Goals: Ongoing  Interventions: Interventions utilized:  Supportive Reflection Standardized Assessments completed: Not Needed  Patient and/or Family Response: Pt agrees with treatment plan  Assessment: Patient currently experiencing Adjustment disorder with anxious mood.   Patient may benefit from psychoeducation and brief therapeutic interventions regarding coping with symptoms of anxiety .  Plan: Follow up with behavioral health clinician on : Two weeks Behavioral recommendations:  -Continue staying busy with distractions daily, to manage current anxious thoughts  Referral(s): Integrated [employer] (In Clinic)  I discussed the assessment and treatment plan with the patient and/or parent/guardian. They were provided an opportunity to ask questions and all were answered. They agreed with the plan and demonstrated an understanding of the instructions.   They were advised to call back or seek an in-person evaluation if the symptoms worsen or if the condition fails to improve as anticipated.  Rae Lips, LCSW  Depression screen [facility] 2/9 10/11/2021 09/05/2021 08/08/2021 07/05/2021 05/31/2021  Decreased Interest 0 1 0 0 0  Down, Depressed, Hopeless 0 1 0 0 0  PHQ - 2 Score 0 2 0 0 0  Altered sleeping 0 0 0 0 0  Tired, decreased energy 0 1 0 0 1  Change in appetite 0 0 0 0 0  Feeling bad or failure about yourself  0 0 0 0 0  Trouble concentrating 0 0 0 0 0  Moving slowly or fidgety/restless 0 0 0 0 0  Suicidal thoughts 0 0 0 0 0  PHQ-9 Score 0 3 0 0 1  Difficult doing work/chores - Not difficult at all - - -   GAD 7 : Generalized Anxiety Score 10/11/2021 09/05/2021 08/08/2021 07/05/2021  Nervous, Anxious, on Edge 1 1 0 1  Control/stop worrying 1 0 0 0  Worry too much - different things 1 1 1  0  Trouble  relaxing 1 0 0 0  Restless 0 0 0 0  Easily annoyed or irritable 0 0 0 0  Afraid - awful might happen 1 1 1  0  Total GAD 7 Score 5 3 2  1

## 2021-10-13 LAB — URINE CULTURE, OB REFLEX

## 2021-10-13 LAB — CULTURE, OB URINE

## 2021-10-18 ENCOUNTER — Ambulatory Visit (INDEPENDENT_AMBULATORY_CARE_PROVIDER_SITE_OTHER): Payer: Medicaid Other | Admitting: *Deleted

## 2021-10-18 ENCOUNTER — Ambulatory Visit (INDEPENDENT_AMBULATORY_CARE_PROVIDER_SITE_OTHER): Payer: Medicaid Other

## 2021-10-18 ENCOUNTER — Other Ambulatory Visit: Payer: Self-pay

## 2021-10-18 DIAGNOSIS — Z8709 Personal history of other diseases of the respiratory system: Secondary | ICD-10-CM | POA: Diagnosis not present

## 2021-10-18 DIAGNOSIS — O24410 Gestational diabetes mellitus in pregnancy, diet controlled: Secondary | ICD-10-CM

## 2021-10-18 IMAGING — US US FETAL BPP W/ NON-STRESS
1 series · 13 of 14 positions shown · non-contrast
Comparison: none

[Series 1: us fetal bpp w/ non-stress · 14 acquisitions, 13 frames shown]
[im 1/14]
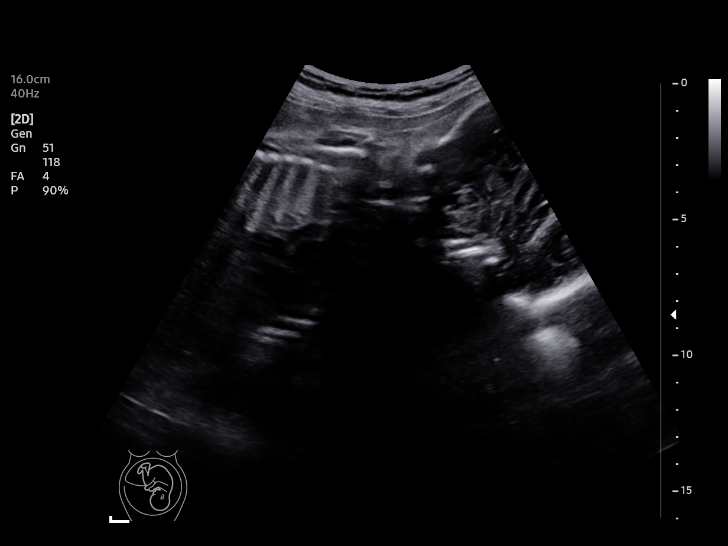
[im 2/14]
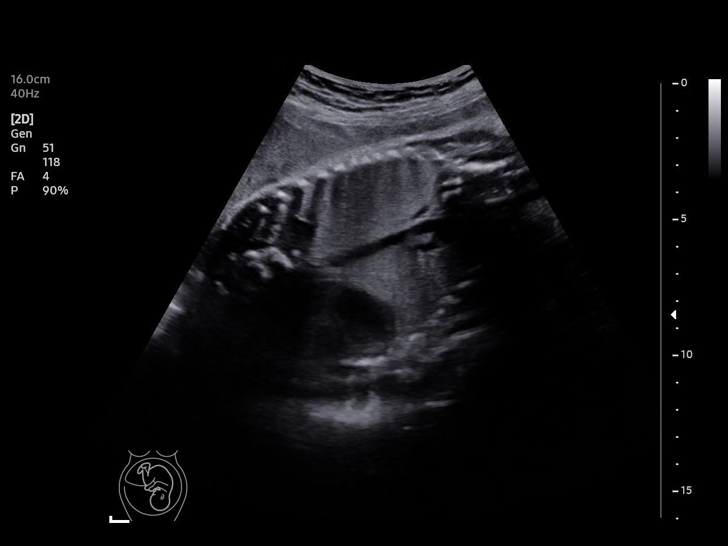
[im 3/14]
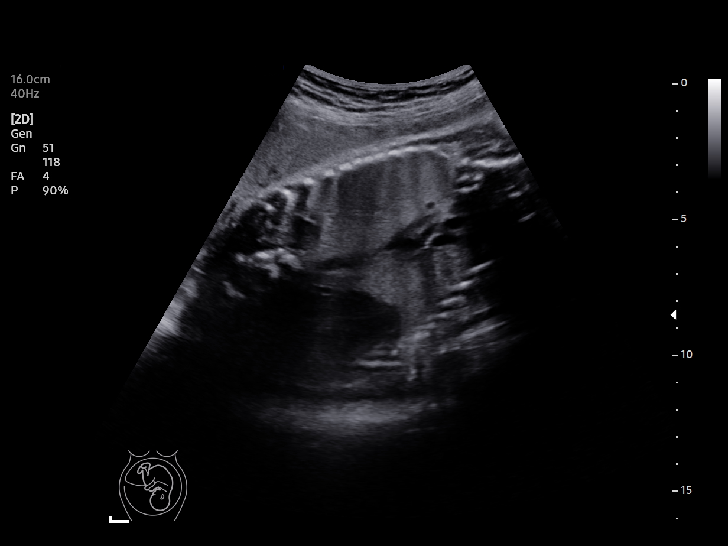
[im 4/14]
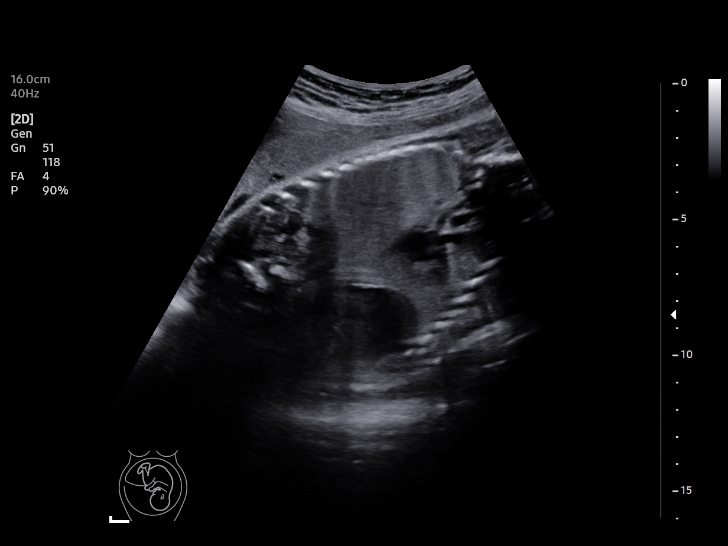
[im 5/14]
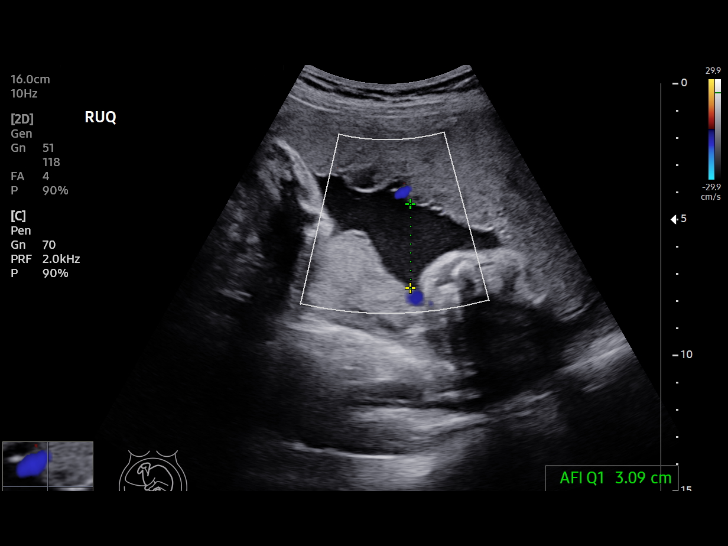
[im 6/14]
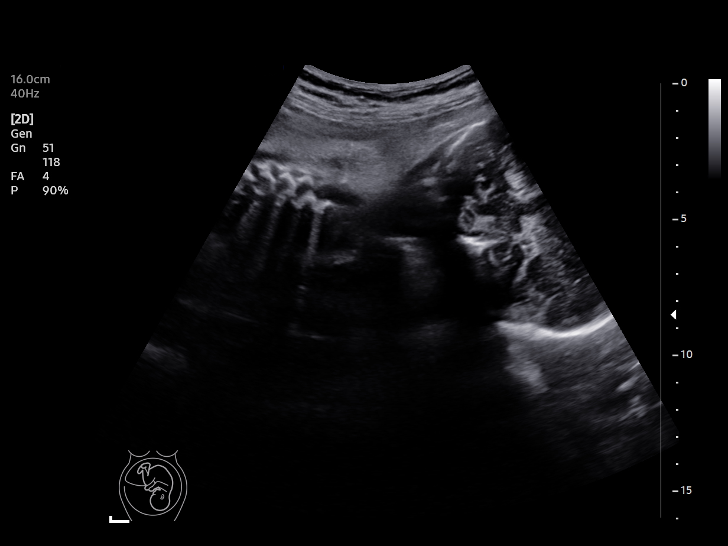
[im 8/14]
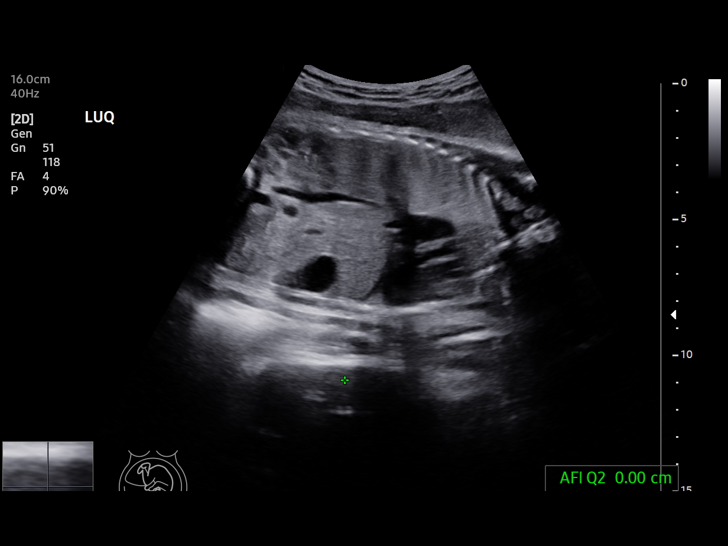
[im 9/14]
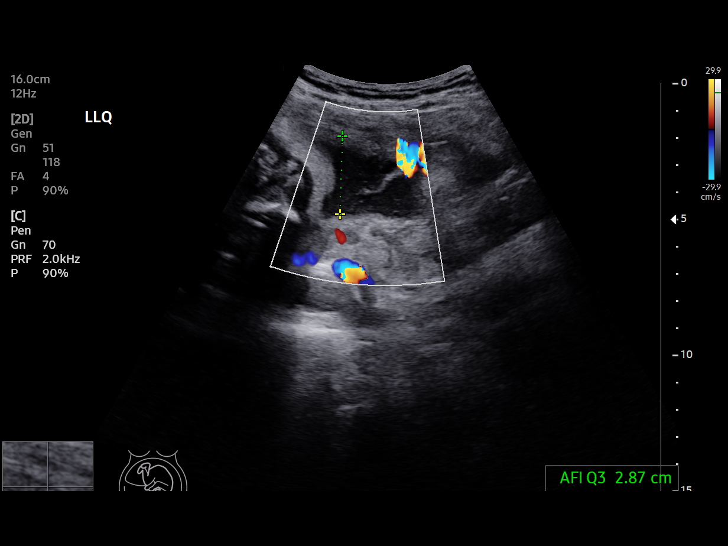
[im 10/14]
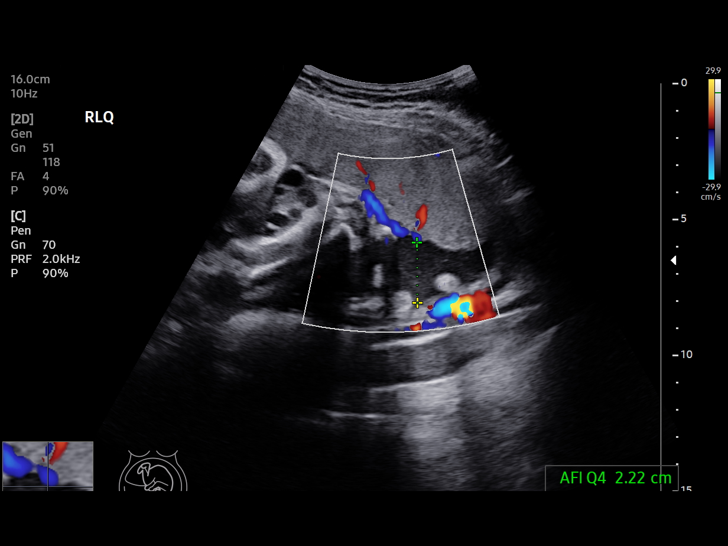
[im 11/14]
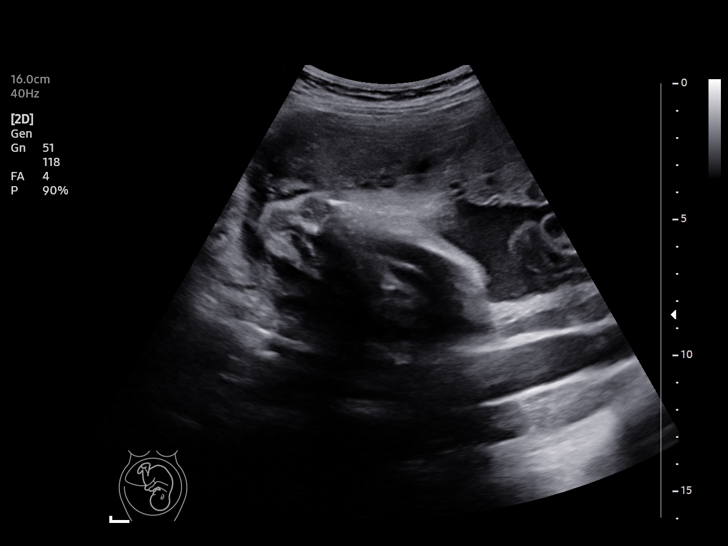
[im 12/14]
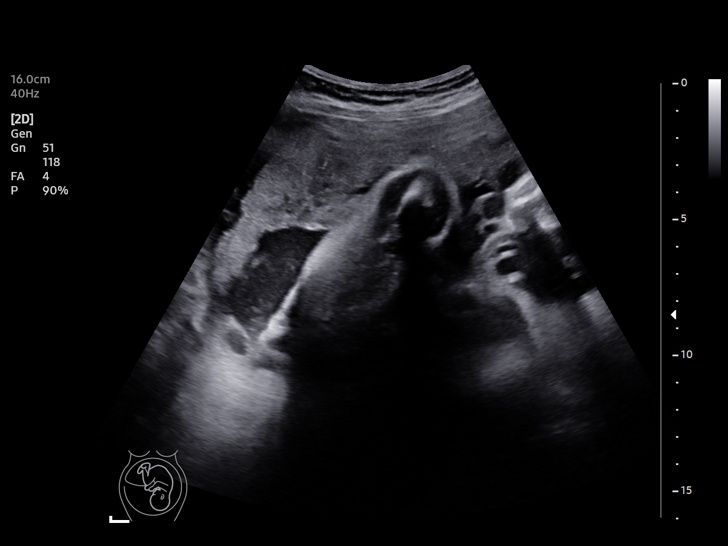
[im 13/14]
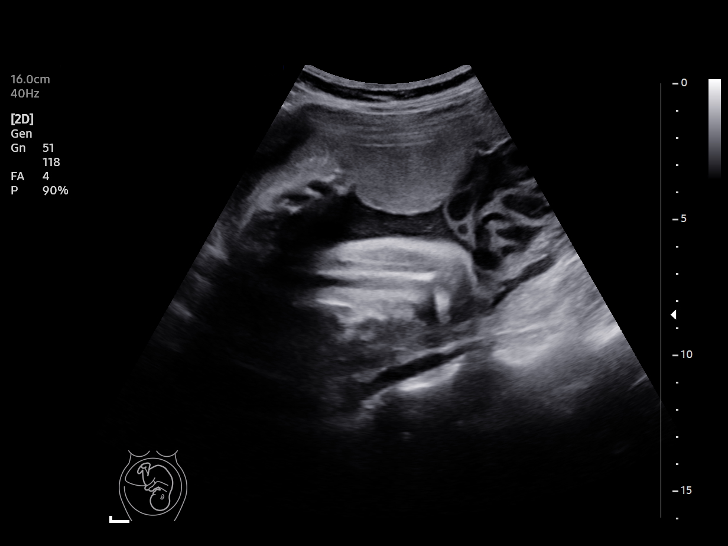
[im 14/14]
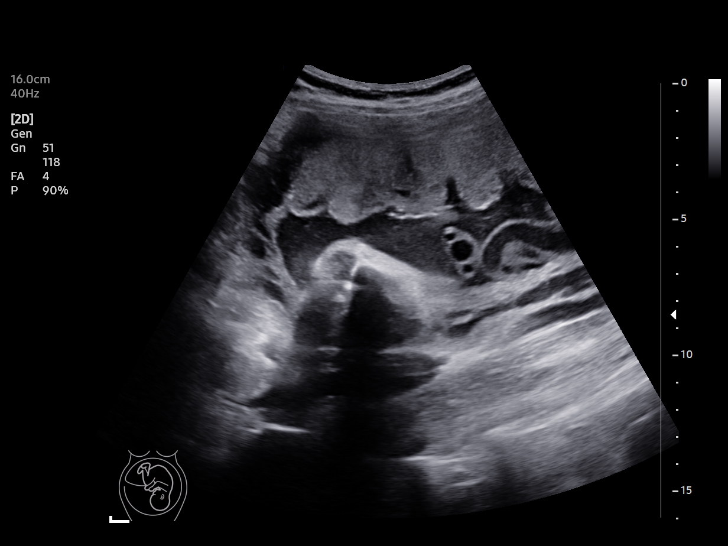

[13 of 14 positions shown; findings below may reference images not displayed]

[REDACTED]care at

 1  US FETAL BPP W/NONSTRESS              76818.4     KIYOMITSU ISIYAMA

Service(s) Provided

Indications

 32 weeks gestation of pregnancy
 Poor obstetrical history; Prior neonatal
 demise (meconium aspiration)
Fetal Evaluation

 Num Of Fetuses:         1
 Preg. Location:         Intrauterine
 Cardiac Activity:       Observed
 Presentation:           Cephalic

 Amniotic Fluid
 AFI FV:      Subjectively low-normal

 AFI Sum(cm)     %Tile       Largest Pocket(cm)


 RUQ(cm)       RLQ(cm)       LUQ(cm)        LLQ(cm)
 3.09          2.22          0
Biophysical Evaluation

 Amniotic F.V:   Pocket => 2 cm             F. Tone:        Observed
 F. Movement:    Observed                   N.S.T:          Reactive
 F. Breathing:   Observed                   Score:          [DATE]
OB History

 Gravidity:    3
 Living:       1
Gestational Age

 LMP:           32w 6d        Date:  03/02/21                 EDD:   12/07/21
 Best:          32w 6d     Det. By:  LMP  (03/02/21)          EDD:   12/07/21
Impression

 BPP [DATE]
 GDM A1
 H/o neonatal demise
Recommendations

 Continue weekly antenatal testing till delivery .
                  Simkhada, Hidemaru

## 2021-10-18 NOTE — Progress Notes (Signed)
Patient seen and assessed by nursing staff.  Agree with documentation and plan. ? ? ?NST:  Baseline: 140 bpm, Variability: Good {> 6 bpm), Accelerations: Reactive, and Decelerations: Absent ? ?

## 2021-10-23 ENCOUNTER — Encounter: Payer: Self-pay | Admitting: Family Medicine

## 2021-10-23 ENCOUNTER — Other Ambulatory Visit: Payer: Self-pay

## 2021-10-23 ENCOUNTER — Ambulatory Visit (INDEPENDENT_AMBULATORY_CARE_PROVIDER_SITE_OTHER): Payer: Medicaid Other | Admitting: Family Medicine

## 2021-10-23 VITALS — BP 99/62 | HR 83 | Wt 142.0 lb

## 2021-10-23 DIAGNOSIS — R350 Frequency of micturition: Secondary | ICD-10-CM

## 2021-10-23 DIAGNOSIS — O24410 Gestational diabetes mellitus in pregnancy, diet controlled: Secondary | ICD-10-CM

## 2021-10-23 DIAGNOSIS — Z634 Disappearance and death of family member: Secondary | ICD-10-CM

## 2021-10-23 DIAGNOSIS — Z8709 Personal history of other diseases of the respiratory system: Secondary | ICD-10-CM

## 2021-10-23 DIAGNOSIS — O0990 Supervision of high risk pregnancy, unspecified, unspecified trimester: Secondary | ICD-10-CM

## 2021-10-23 DIAGNOSIS — Z3A33 33 weeks gestation of pregnancy: Secondary | ICD-10-CM

## 2021-10-23 LAB — POCT URINALYSIS DIP (DEVICE)
Bilirubin Urine: NEGATIVE
Glucose, UA: 250 mg/dL — AB
Hgb urine dipstick: NEGATIVE
Ketones, ur: 40 mg/dL — AB
Nitrite: NEGATIVE
Protein, ur: NEGATIVE mg/dL
Specific Gravity, Urine: >=1.03 (ref 1.005–1.030)
Urobilinogen, UA: 0.2 mg/dL (ref 0.0–1.0)
pH: 7 (ref 5.0–8.0)

## 2021-10-23 NOTE — Progress Notes (Signed)
PRENATAL VISIT NOTE  Subjective:  [name] is a 31 y.o. G3P2001 at [redacted]w[redacted]d being seen today for ongoing prenatal care.  She is currently monitored for the following issues for this high-risk pregnancy and has Supervision of high risk pregnancy, antepartum; History of retained placenta in prior pregnancy, currently pregnant; Prior delivery with meconium aspiration; GBS bacteriuria; Loss of infant; History of postpartum endometritis; Gestational thrombocytopenia (HCC); GDM (gestational diabetes mellitus); and Low vitamin D level on their problem list.  Patient reports no complaints.  Contractions: Not present. Vag. Bleeding: None.  Movement: Present. Denies leaking of fluid.   She had a urine culture done last visit due to some frequency and discomfort with urination. This was negative. She reports that she still sometimes has this discomfort and is wondering what else to do for this. It is located on her lower abdomen over her bladder. It hurts before and after urinating. Reports that her urine is sometimes dark yellow, other times it is light yellow. She thinks she is hydrated.   The following portions of the patient's history were reviewed and updated as appropriate: allergies, current medications, past family history, past medical history, past social history, past surgical history and problem list.   Objective:   Vitals:   10/23/21 1058  BP: 99/62  Pulse: 83  Weight: 142 lb (64.4 kg)    Fetal Status: Fetal Heart Rate (bpm): 153 Fundal Height: 33 cm Movement: Present  General:  Alert, oriented and cooperative. Patient is in no acute distress.  Skin: Skin is warm and dry. No rash noted.   Cardiovascular: Normal heart rate noted.  Respiratory: Normal respiratory effort, no problems with respiration noted.  Abdomen: Soft, nontender, gravid, appropriate for gestational age.       Pelvic: Cervical exam deferred.  Extremities: Normal range of motion.  Edema: None.  Mental Status:  Normal mood and affect. Normal behavior. Normal judgment and thought content.   Assessment and Plan:  Pregnancy: G3P2001 at [redacted]w[redacted]d  1. Supervision of high risk pregnancy, antepartum 2. [redacted] weeks gestation of pregnancy Progressing well. FH and FHT within normal limits. Follow up in 2 weeks for next prenatal visit.   3. Diet controlled gestational diabetes mellitus (GDM), antepartum Patient did not bring log today. Reports fasting values between 70-90 and postprandial values <120. FH appropriate. Last growth 19% EFW. Encouraged patient to bring log next visit.   4. Loss of infant 5. Prior delivery with meconium aspiration Planning for cesarean section on 2/24.   6. Urinary frequency UA obtained today. Trace leukocytes with glucose and ketones. Fairly concentrated. Otherwise normal. Will send for microscopy and culture. Encouraged increased hydration. Will follow up results and treat as indicated.  - Urinalysis, Routine w reflex microscopic - Culture, OB Urine  Preterm labor symptoms and general obstetric precautions including but not limited to vaginal bleeding, contractions, leaking of fluid and fetal movement were reviewed in detail with the patient.  Please refer to After Visit Summary for other counseling recommendations.   Return in about 2 weeks (around 11/06/2021) for follow up HR OB visit.  Future Appointments  Date Time Provider [place]  10/24/2021  1:00 PM [facility] NURSE [facility]  10/24/2021  1:15 PM [facility] US2 [facility]  10/25/2021  2:45 PM [facility]  11/01/2021  1:15 PM [facility] NST [facility]  11/07/2021  1:30 PM [facility] NURSE [facility]  11/07/2021  1:45 PM [facility] US6 [facility]  11/07/2021  3:15 PM Radene Gunning, MD  [facility]   Genia Del, MD

## 2021-10-24 ENCOUNTER — Ambulatory Visit: Payer: Medicaid Other | Admitting: *Deleted

## 2021-10-24 ENCOUNTER — Ambulatory Visit: Payer: Medicaid Other | Attending: Obstetrics

## 2021-10-24 ENCOUNTER — Encounter: Payer: Self-pay | Admitting: *Deleted

## 2021-10-24 VITALS — BP 95/58 | HR 74

## 2021-10-24 DIAGNOSIS — O09293 Supervision of pregnancy with other poor reproductive or obstetric history, third trimester: Secondary | ICD-10-CM | POA: Diagnosis present

## 2021-10-24 DIAGNOSIS — O24419 Gestational diabetes mellitus in pregnancy, unspecified control: Secondary | ICD-10-CM | POA: Diagnosis present

## 2021-10-24 DIAGNOSIS — Z3A33 33 weeks gestation of pregnancy: Secondary | ICD-10-CM

## 2021-10-24 DIAGNOSIS — O0990 Supervision of high risk pregnancy, unspecified, unspecified trimester: Secondary | ICD-10-CM | POA: Diagnosis present

## 2021-10-24 LAB — URINALYSIS, ROUTINE W REFLEX MICROSCOPIC
Bilirubin, UA: NEGATIVE
Nitrite, UA: NEGATIVE
RBC, UA: NEGATIVE
Specific Gravity, UA: 1.021 (ref 1.005–1.030)
Urobilinogen, Ur: 1 mg/dL (ref 0.2–1.0)
pH, UA: 7 (ref 5.0–7.5)

## 2021-10-24 LAB — MICROSCOPIC EXAMINATION
Casts: NONE SEEN /lpf
Epithelial Cells (non renal): >10 /hpf — AB (ref 0–10)

## 2021-10-24 IMAGING — US US MFM FETAL BPP W/O NON-STRESS
1 series · 15 of 27 positions shown · non-contrast
Comparison: none

[Series 1: us mfm fetal bpp w/o non-stress · 27 acquisitions, 15 frames shown]
[im 1/27]
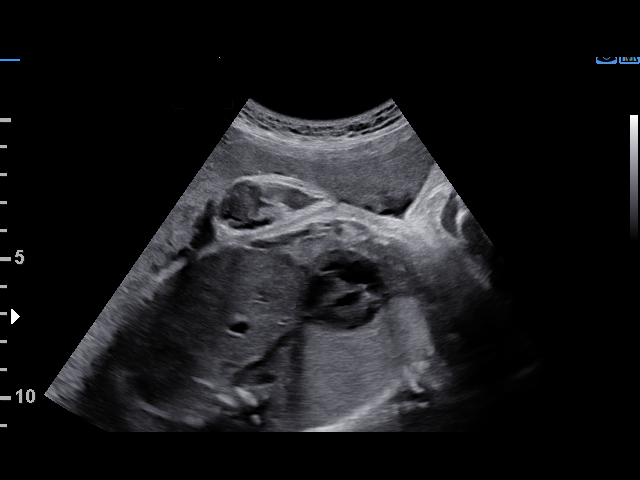
[im 3/27]
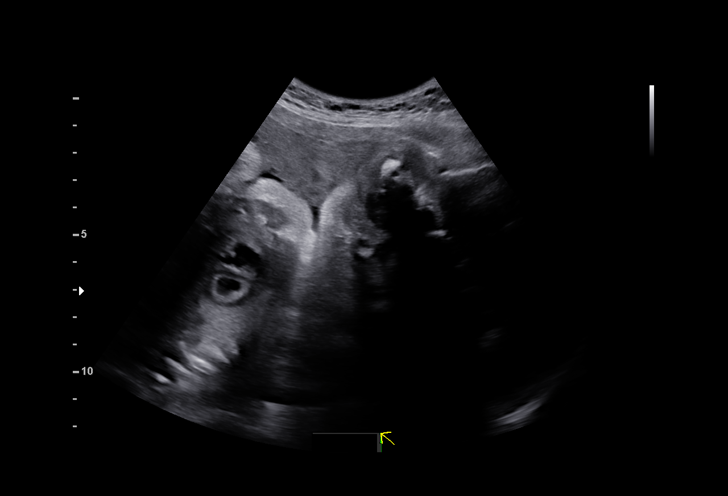
[im 5/27]
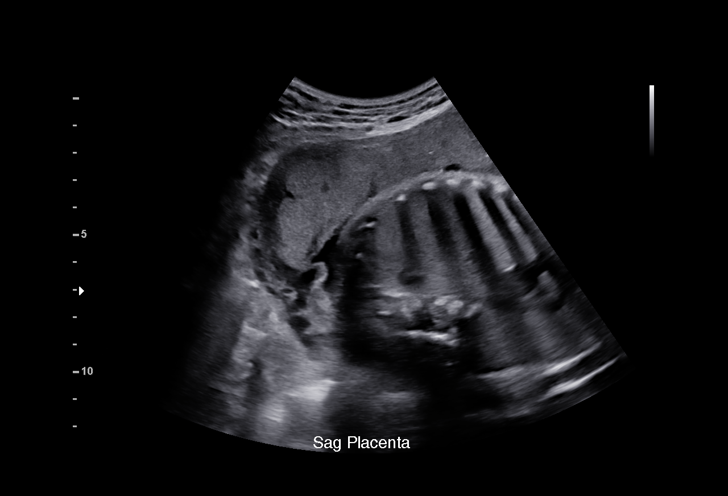
[im 7/27]
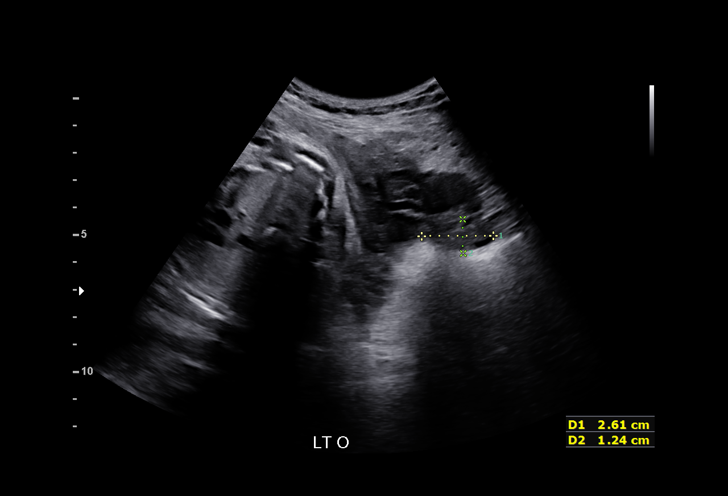
[im 9/27]
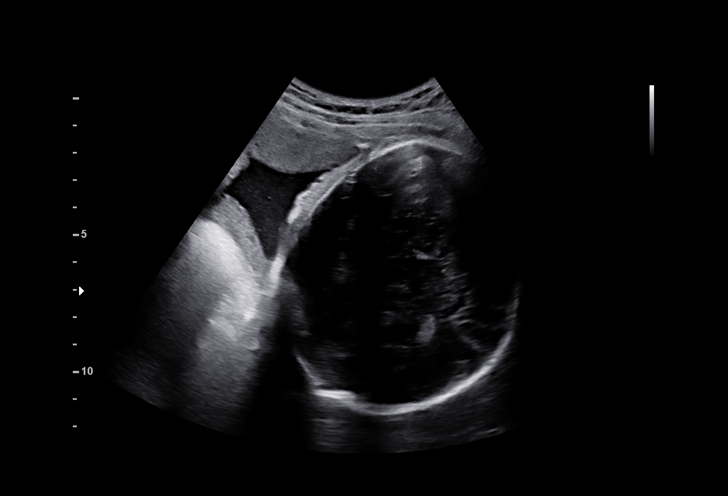
[im 10/27]
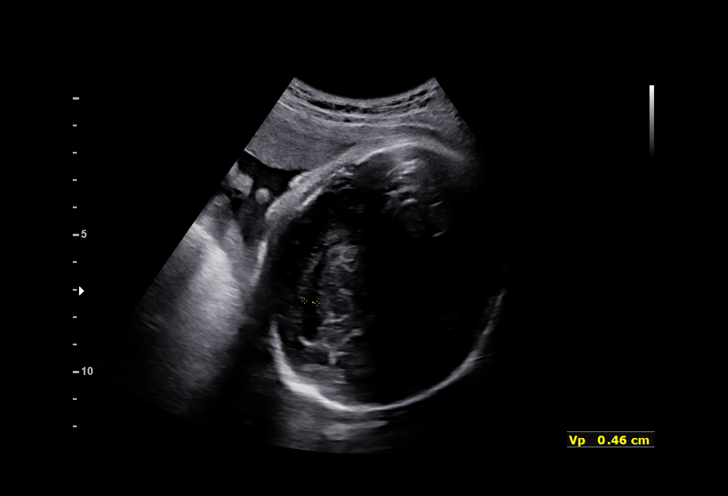
[im 12/27]
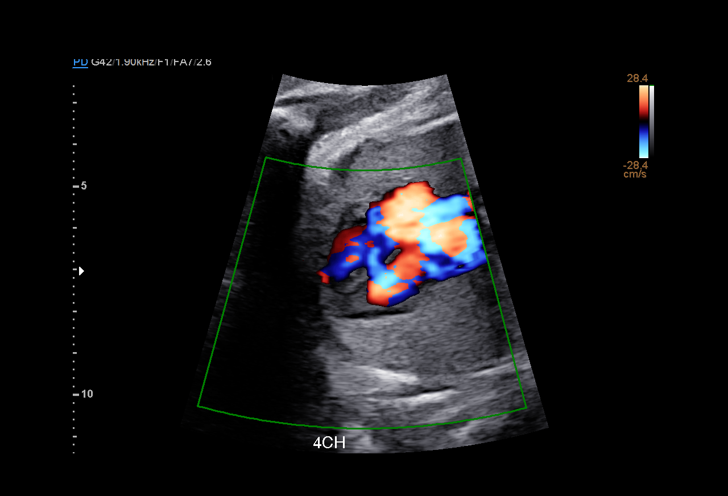
[im 14/27]
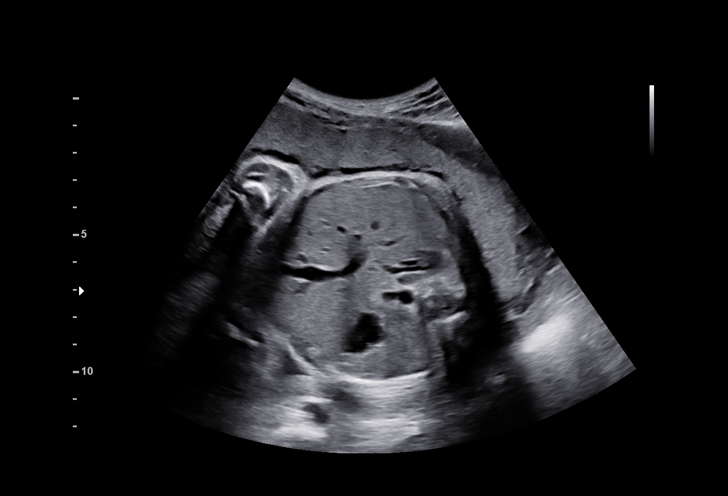
[im 16/27]
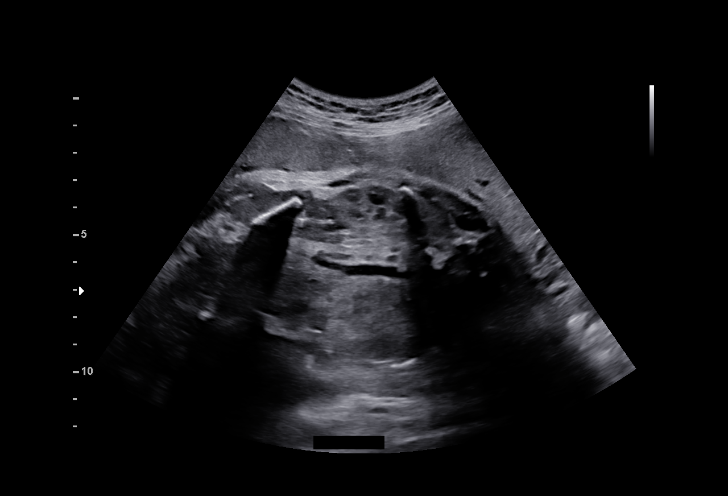
[im 18/27]
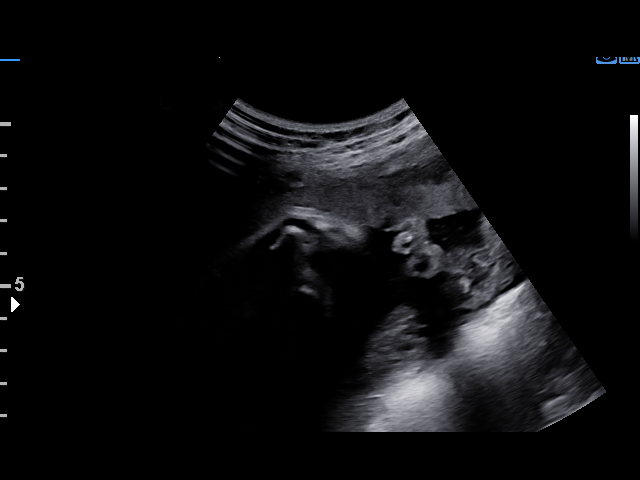
[im 19/27]
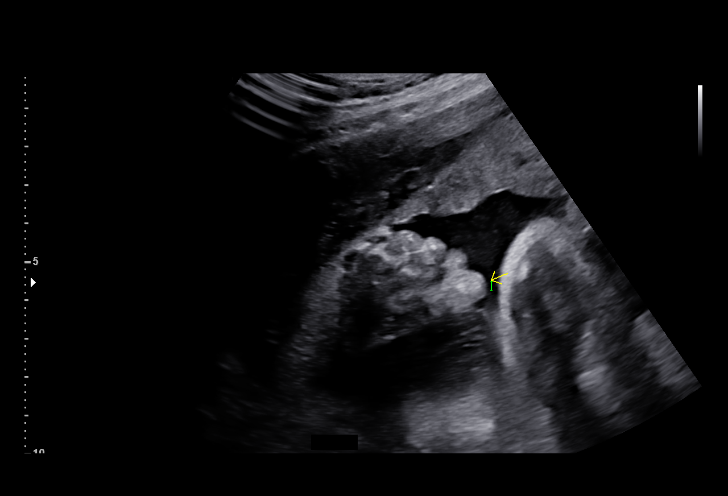
[im 21/27]
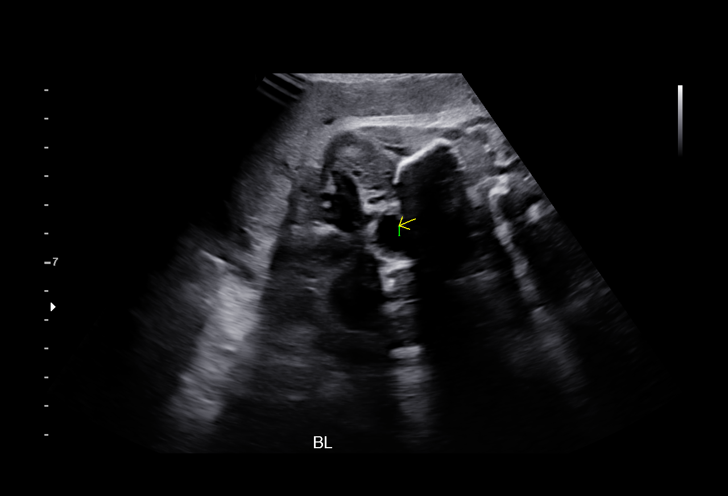
[im 23/27]
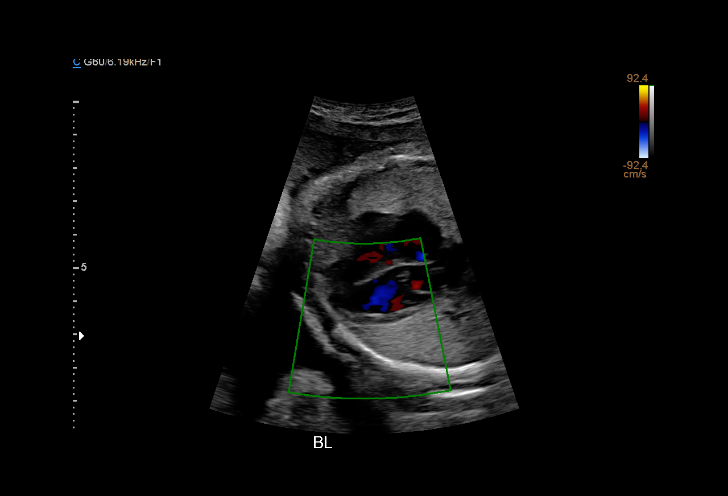
[im 25/27]
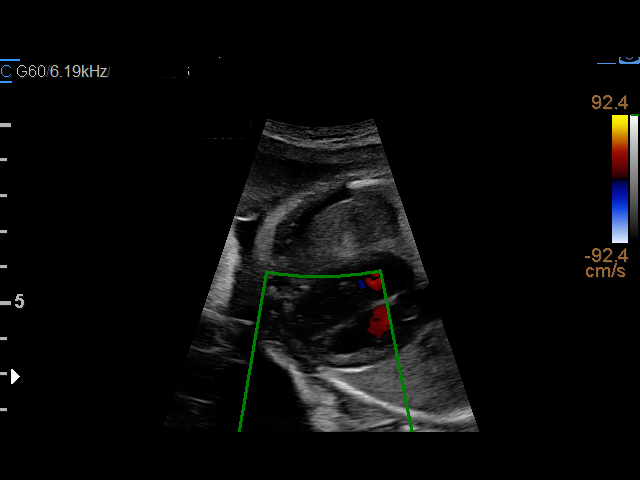
[im 27/27]
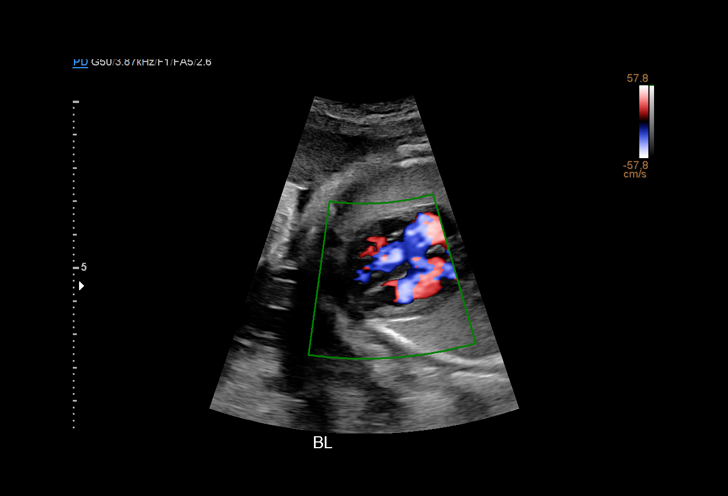

[15 of 27 positions shown; findings below may reference images not displayed]

Indications

 33 weeks gestation of pregnancy
 Poor obstetrical history; Prior neonatal
 demise (meconium aspiration)
 LR NIPS, Neg [place]
Fetal Evaluation

 Num Of Fetuses:         1
 Fetal Heart Rate(bpm):  155
 Cardiac Activity:       Observed
 Presentation:           Cephalic
 Placenta:               Anterior
 P. Cord Insertion:      Previously Visualized

 Amniotic Fluid
 AFI FV:      Within normal limits

 AFI Sum(cm)     %Tile       Largest Pocket(cm)
 10.37           21

 RUQ(cm)       RLQ(cm)       LUQ(cm)        LLQ(cm)

Biophysical Evaluation

 Amniotic F.V:   Within normal limits       F. Tone:        Observed
 F. Movement:    Observed                   Score:          [DATE]
 F. Breathing:   Observed
Biometry

 LV:        4.6  mm
OB History

 Gravidity:    3
 Living:       1
Gestational Age

 LMP:           33w 5d        Date:  03/02/21                 EDD:   12/07/21
 Best:          33w 5d     Det. By:  LMP  (03/02/21)          EDD:   12/07/21
Anatomy

 Ventricles:            Appears normal         Kidneys:                Appear normal
 Heart:                 Appears normal         Bladder:                Appears normal
                        (4CH, axis, and
                        situs)
 Stomach:               Appears normal, left
                        sided
Cervix Uterus Adnexa

 Cervix
 Not visualized (advanced GA >37wks)

 Right Ovary
 Visualized.

 Left Ovary
 Visualized.
Impression

 Gestational diabetes.  Well-controlled on diet.
 History of neonatal death.

 Amniotic fluid is normal and good fetal activity is seen
 .Antenatal testing is reassuring. BPP [DATE].
Recommendations

 -Fetal growth at our office on 11/07/21.
 -Patient has an appointment for BPP and NST at your office
 next week.
                 Vaghela, Leonid

## 2021-10-25 ENCOUNTER — Ambulatory Visit (INDEPENDENT_AMBULATORY_CARE_PROVIDER_SITE_OTHER): Payer: Medicaid Other | Admitting: Clinical

## 2021-10-25 DIAGNOSIS — F4322 Adjustment disorder with anxiety: Secondary | ICD-10-CM

## 2021-10-25 LAB — CULTURE, OB URINE

## 2021-10-25 LAB — URINE CULTURE, OB REFLEX

## 2021-10-25 NOTE — Patient Instructions (Signed)
[facility] [address] [place], [place] [address] [phone] (main office) [phone] (Nawaf Strange's office)   

## 2021-10-26 NOTE — BH Specialist Note (Signed)
Integrated Behavioral Health via Telemedicine Visit  10/26/2021 [name] [id number]  Number of Integrated Behavioral Health visits: 2 Session Start time: 9:48  Session End time: 10:24 Total time:  36  Referring Provider: Clayton Bibles, CNM Patient/Family location: Home [facility] Provider location: [facility]  All persons participating in visit: Patient [name] and [name]   Types of Service: Individual psychotherapy and Video visit  I connected with Jule Ser and/or Maiana Keziah's  n/a  via  Telephone or Video Enabled Telemedicine Application  (Video is Caregility application) and verified that I am speaking with the correct person using two identifiers. Discussed confidentiality: Yes   I discussed the limitations of telemedicine and the availability of in person appointments.  Discussed there is a possibility of technology failure and discussed alternative modes of communication if that failure occurs.  I discussed that engaging in this telemedicine visit, they consent to the provision of behavioral healthcare and the services will be billed under their insurance.  Patient and/or legal guardian expressed understanding and consented to Telemedicine visit: Yes   Presenting Concerns: Patient and/or family reports the following symptoms/concerns: Anxious about upcoming birth; prefers to attempt vaginal birth, on date her husband will be with her, with only known medical providers. Duration of problem: Increasing anxiety as birth nears; Severity of problem:  moderately severe  Patient and/or Family's Strengths/Protective Factors: Social connections, Concrete supports in place (healthy food, safe environments, etc.), Sense of purpose, and Physical Health (exercise, healthy diet, medication compliance, etc.)  Goals Addressed: Patient will:  Reduce symptoms of: anxiety   Progress towards  Goals: Ongoing  Interventions: Interventions utilized:  Solution-Focused Strategies Standardized Assessments completed:  PHQ9/GAD7 given within 2 weeks  Patient and/or Family Response: Pt agrees with treatment plan  Assessment: Patient currently experiencing Adjustment disorder with anxious mood.   Patient may benefit from continued therapeutic interventions to manage anxiety.  Plan: Follow up with behavioral health clinician on : Asher Muir will call within next two weeks Behavioral recommendations:  -Continue self-advocacy for positive future birthing experience; continue to focus on healthy self-care throughout remainder of pregnancy, and daily distractions to manage anxious thoughts Referral(s): Integrated [employer] (In Clinic)  I discussed the assessment and treatment plan with the patient and/or parent/guardian. They were provided an opportunity to ask questions and all were answered. They agreed with the plan and demonstrated an understanding of the instructions.   They were advised to call back or seek an in-person evaluation if the symptoms worsen or if the condition fails to improve as anticipated.  Rae Lips, LCSW  Depression screen [facility] 2/9 11/07/2021 10/11/2021 09/05/2021 08/08/2021 07/05/2021  Decreased Interest 0 0 1 0 0  Down, Depressed, Hopeless 0 0 1 0 0  PHQ - 2 Score 0 0 2 0 0  Altered sleeping 1 0 0 0 0  Tired, decreased energy 1 0 1 0 0  Change in appetite 0 0 0 0 0  Feeling bad or failure about yourself  0 0 0 0 0  Trouble concentrating 0 0 0 0 0  Moving slowly or fidgety/restless 0 0 0 0 0  Suicidal thoughts 0 0 0 0 0  PHQ-9 Score 2 0 3 0 0  Difficult doing work/chores - - Not difficult at all - -   GAD 7 : Generalized Anxiety Score 11/07/2021 10/11/2021 09/05/2021 08/08/2021  Nervous, Anxious, on Edge 1 1 1  0  Control/stop worrying 0 1 0  0  Worry too much - different things 1 1 1 1   Trouble relaxing 0 1 0 0  Restless 0 0 0 0  Easily  annoyed or irritable 1 0 0 0  Afraid - awful might happen 1 1 1 1   Total GAD 7 Score 4 5 3  2

## 2021-10-29 ENCOUNTER — Other Ambulatory Visit: Payer: Self-pay | Admitting: Family Medicine

## 2021-11-01 ENCOUNTER — Ambulatory Visit (INDEPENDENT_AMBULATORY_CARE_PROVIDER_SITE_OTHER): Payer: Self-pay | Admitting: General Practice

## 2021-11-01 ENCOUNTER — Other Ambulatory Visit: Payer: Self-pay

## 2021-11-01 ENCOUNTER — Ambulatory Visit (INDEPENDENT_AMBULATORY_CARE_PROVIDER_SITE_OTHER): Payer: Medicaid Other

## 2021-11-01 VITALS — BP 102/65 | HR 84

## 2021-11-01 DIAGNOSIS — O09299 Supervision of pregnancy with other poor reproductive or obstetric history, unspecified trimester: Secondary | ICD-10-CM

## 2021-11-01 IMAGING — US US FETAL BPP W/ NON-STRESS
1 series · 13 of 13 positions shown · non-contrast
Comparison: none

[Series 1: us fetal bpp w/ non-stress · 13 acquisitions, 13 frames shown]
[im 1/13]
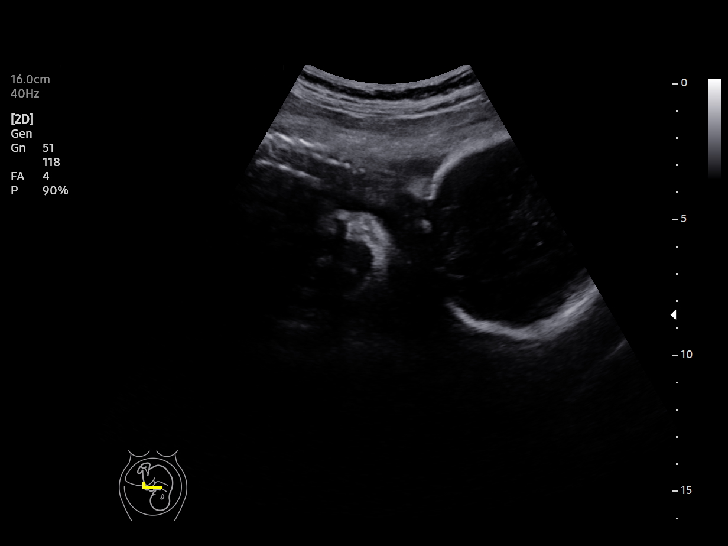
[im 2/13]
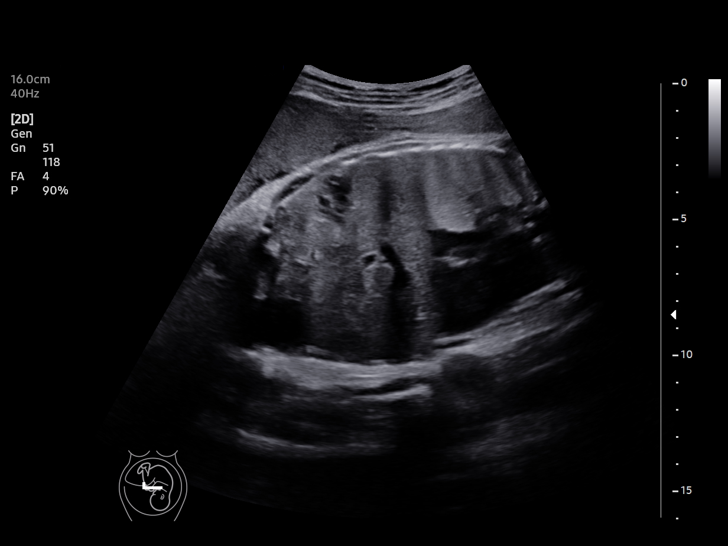
[im 3/13]
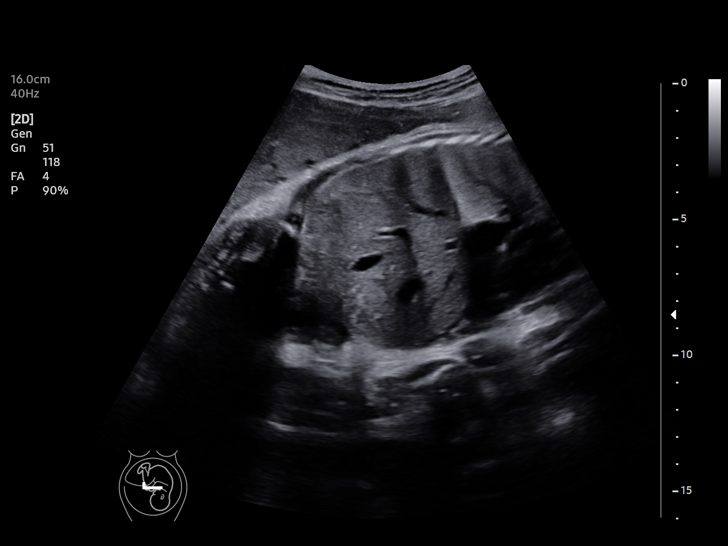
[im 4/13]
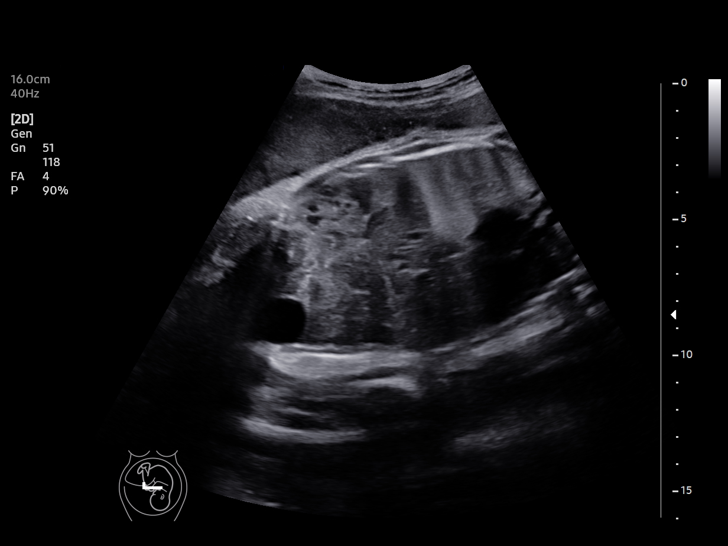
[im 5/13]
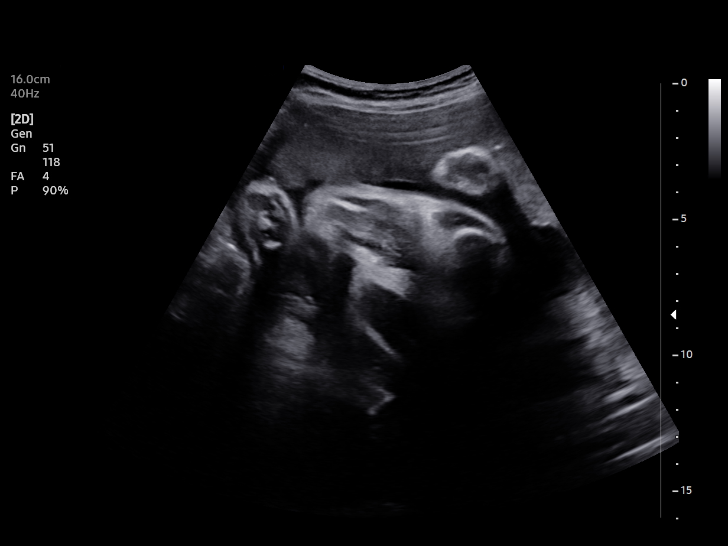
[im 6/13]
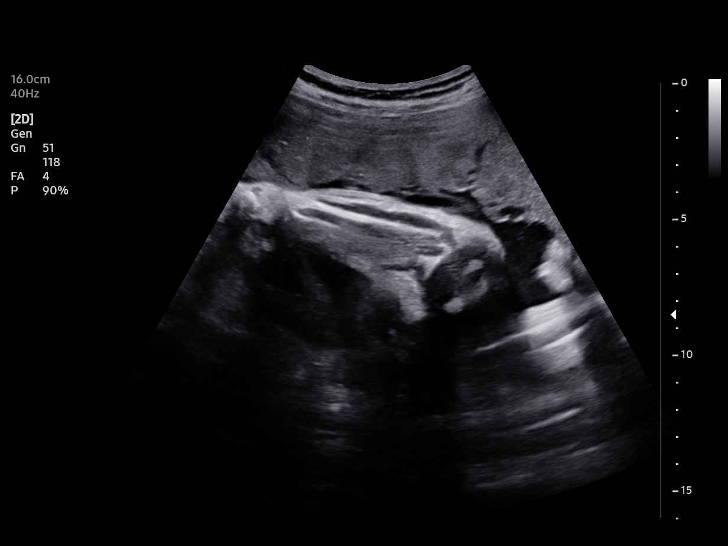
[im 7/13]
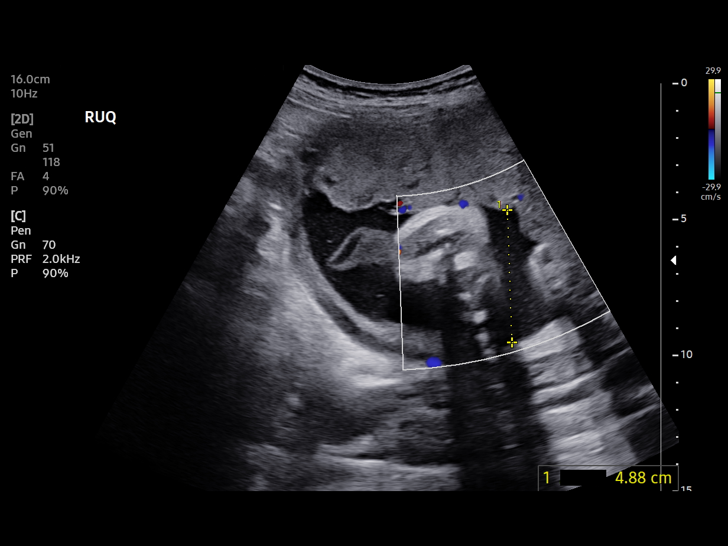
[im 8/13]
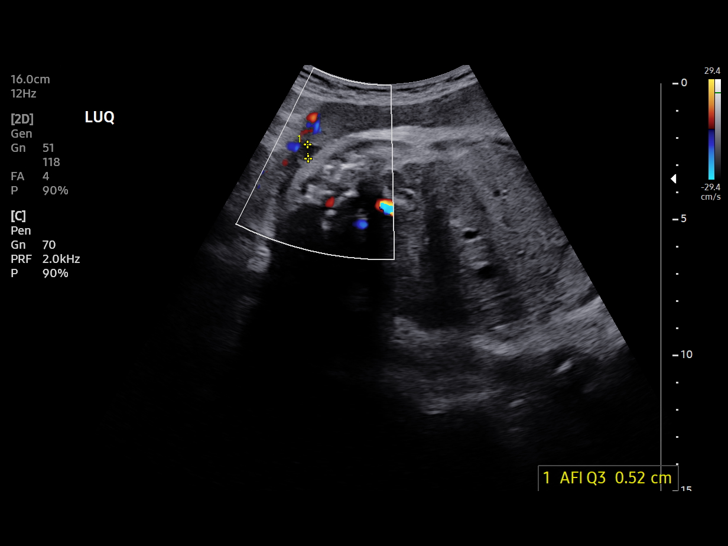
[im 9/13]
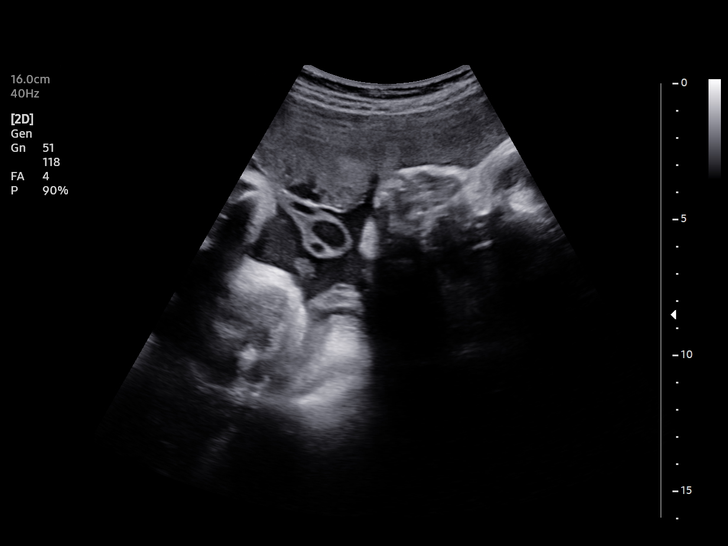
[im 10/13]
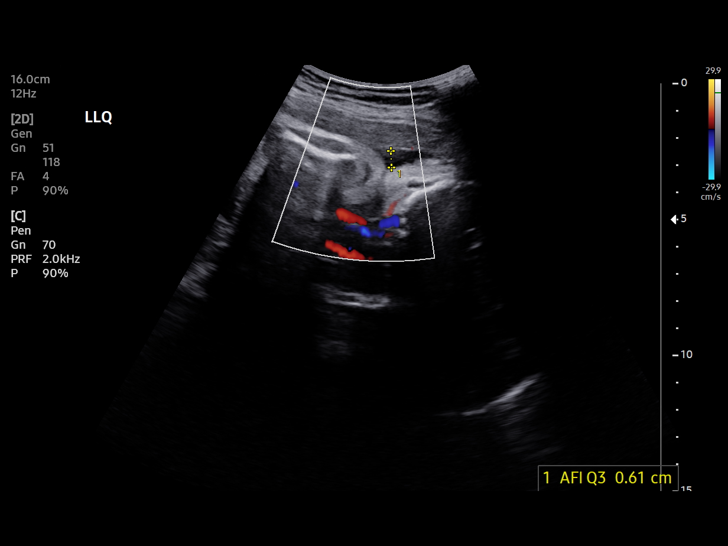
[im 11/13]
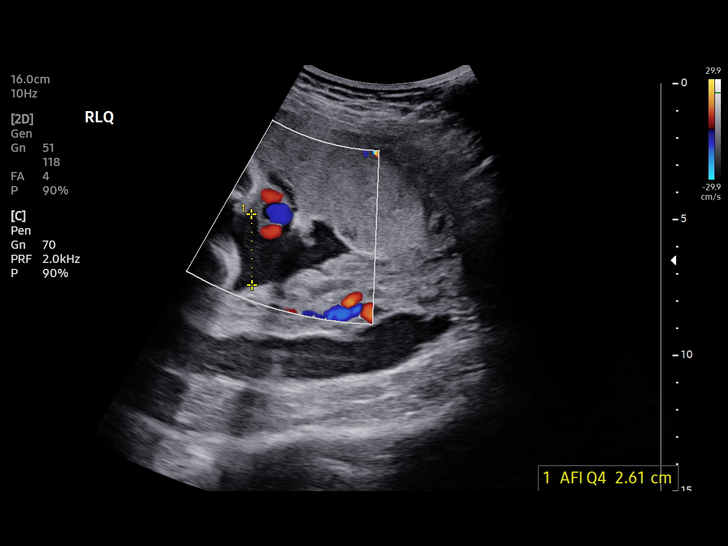
[im 12/13]
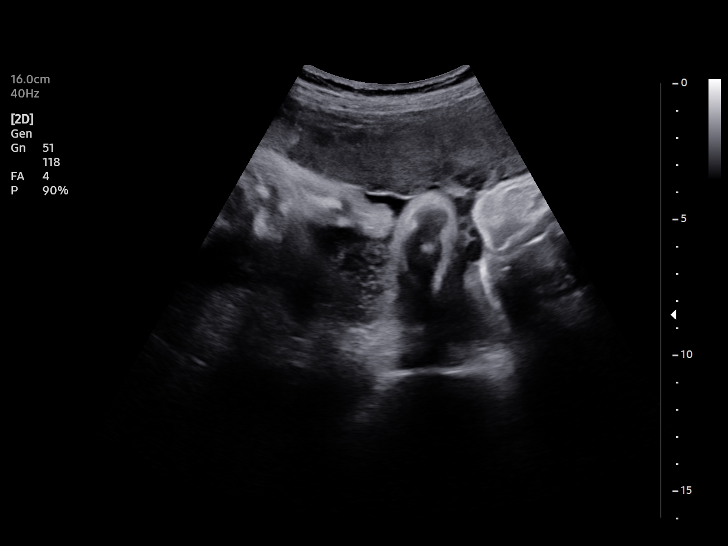
[im 13/13]
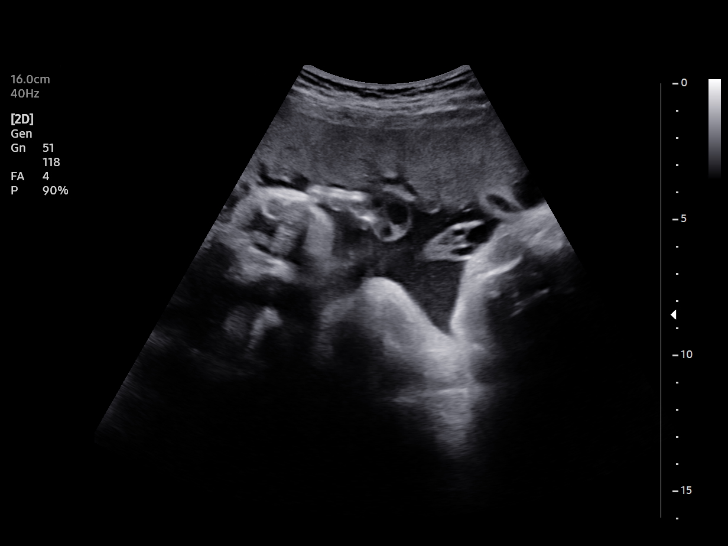

[13 of 13 positions shown; findings below may reference images not displayed]

[REDACTED]care at

Indications

 34 weeks gestation of pregnancy
 Poor obstetric history: Previous neonatal
 death
Fetal Evaluation

 Num Of Fetuses:         1
 Preg. Location:         Intrauterine
 Cardiac Activity:       Observed
 Fetal Lie:              Maternal left side
 Presentation:           Cephalic

 Amniotic Fluid
 AFI FV:      Subjectively low-normal

 AFI Sum(cm)     %Tile       Largest Pocket(cm)
 8.77            11

 RUQ(cm)       RLQ(cm)       LUQ(cm)        LLQ(cm)


 Comment:    BPP [DATE]
Biophysical Evaluation

 Amniotic F.V:   Pocket => 2 cm             F. Tone:        Observed
 F. Movement:    Observed                   N.S.T:          Reactive
 F. Breathing:   Observed                   Score:          [DATE]
OB History

 Gravidity:    3
 Living:       1
Gestational Age

 LMP:           34w 6d        Date:  03/02/21                 EDD:   12/07/21
 Best:          34w 6d     Det. By:  LMP  (03/02/21)          EDD:   12/07/21

## 2021-11-01 NOTE — Progress Notes (Signed)
Pt informed that the ultrasound is considered a limited OB ultrasound and is not intended to be a complete ultrasound exam.  Patient also informed that the ultrasound is not being completed with the intent of assessing for fetal or placental anomalies or any pelvic abnormalities.  Explained that the purpose of today’s ultrasound is to assess for  BPP, presentation, and AFI.  Patient acknowledges the purpose of the exam and the limitations of the study.    ° °Marga Gramajo H RN BSN °11/01/21 ° °

## 2021-11-05 NOTE — Progress Notes (Signed)
° °  PRENATAL VISIT NOTE  Subjective:  [name] is a 31 y.o. G3P2001 at [redacted]w[redacted]d being seen today for ongoing prenatal care.  She is currently monitored for the following issues for this high-risk pregnancy and has Supervision of high risk pregnancy, antepartum; History of retained placenta in prior pregnancy, currently pregnant; Prior delivery with meconium aspiration; GBS bacteriuria; Loss of infant; History of postpartum endometritis; Gestational thrombocytopenia (HCC); GDM (gestational diabetes mellitus); and Low vitamin D level on their problem list.  Patient reports no complaints.  Contractions: Not present. Vag. Bleeding: None.  Movement: Present. Denies leaking of fluid.   The following portions of the patient's history were reviewed and updated as appropriate: allergies, current medications, past family history, past medical history, past social history, past surgical history and problem list.   Objective:   Vitals:   11/07/21 1519  BP: (!) 100/58  Pulse: 71  Weight: 143 lb 11.2 oz (65.2 kg)    Fetal Status: Fetal Heart Rate (bpm): 144   Movement: Present     General:  Alert, oriented and cooperative. Patient is in no acute distress.  Skin: Skin is warm and dry. No rash noted.   Cardiovascular: Normal heart rate noted  Respiratory: Normal respiratory effort, no problems with respiration noted  Abdomen: Soft, gravid, appropriate for gestational age.  Pain/Pressure: Absent     Pelvic: Cervical exam deferred        Extremities: Normal range of motion.  Edema: Trace  Mental Status: Normal mood and affect. Normal behavior. Normal judgment and thought content.   Assessment and Plan:  Pregnancy: G3P2001 at [redacted]w[redacted]d 1. Diet controlled gestational diabetes mellitus (GDM), antepartum - CBGs reviewed: Pt reports values are all normal from her memory. Encouraged her to bring her log or her meter - Growth on 1/3 was normal at 19%ile, HC=AC.  - Growth today is pending but pt reports it  is normal  2. Benign gestational thrombocytopenia in third trimester ([place]) - Recheck CBC today  - Last check was 12/15 and was 147  3. Supervision of high risk pregnancy, antepartum - GC/CT collected today by urine  4. Loss of infant - Due to mec aspiration, plan is for 1LTCS with [place] on 2/24  5. GBS bacteriuria  Preterm labor symptoms and general obstetric precautions including but not limited to vaginal bleeding, contractions, leaking of fluid and fetal movement were reviewed in detail with the patient. Please refer to After Visit Summary for other counseling recommendations.   Return in about 1 week (around 11/14/2021) for OB VISIT, MD only.  Future Appointments  Date Time Provider [place]  11/08/2021  9:45 AM [place] [facility]  11/15/2021  2:15 PM [facility] NST [facility]    Radene Gunning, MD

## 2021-11-07 ENCOUNTER — Ambulatory Visit (INDEPENDENT_AMBULATORY_CARE_PROVIDER_SITE_OTHER): Payer: Medicaid Other | Admitting: Obstetrics and Gynecology

## 2021-11-07 ENCOUNTER — Other Ambulatory Visit: Payer: Self-pay

## 2021-11-07 ENCOUNTER — Ambulatory Visit: Payer: Medicaid Other | Attending: Obstetrics

## 2021-11-07 ENCOUNTER — Other Ambulatory Visit (HOSPITAL_COMMUNITY)
Admission: RE | Admit: 2021-11-07 | Discharge: 2021-11-07 | Disposition: A | Discharge status: Home / Self Care | Payer: Medicaid Other | Source: Ambulatory Visit | Attending: Obstetrics and Gynecology | Admitting: Obstetrics and Gynecology

## 2021-11-07 ENCOUNTER — Ambulatory Visit: Payer: Medicaid Other | Admitting: *Deleted

## 2021-11-07 ENCOUNTER — Encounter: Payer: Self-pay | Admitting: Obstetrics and Gynecology

## 2021-11-07 VITALS — BP 100/58 | HR 71

## 2021-11-07 VITALS — BP 100/58 | HR 71 | Wt 143.7 lb

## 2021-11-07 DIAGNOSIS — R8271 Bacteriuria: Secondary | ICD-10-CM

## 2021-11-07 DIAGNOSIS — O24410 Gestational diabetes mellitus in pregnancy, diet controlled: Secondary | ICD-10-CM

## 2021-11-07 DIAGNOSIS — Z3A35 35 weeks gestation of pregnancy: Secondary | ICD-10-CM

## 2021-11-07 DIAGNOSIS — O0990 Supervision of high risk pregnancy, unspecified, unspecified trimester: Secondary | ICD-10-CM | POA: Insufficient documentation

## 2021-11-07 DIAGNOSIS — O24419 Gestational diabetes mellitus in pregnancy, unspecified control: Secondary | ICD-10-CM

## 2021-11-07 DIAGNOSIS — O09293 Supervision of pregnancy with other poor reproductive or obstetric history, third trimester: Secondary | ICD-10-CM

## 2021-11-07 DIAGNOSIS — D696 Thrombocytopenia, unspecified: Secondary | ICD-10-CM

## 2021-11-07 DIAGNOSIS — O99113 Other diseases of the blood and blood-forming organs and certain disorders involving the immune mechanism complicating pregnancy, third trimester: Secondary | ICD-10-CM

## 2021-11-07 DIAGNOSIS — Z634 Disappearance and death of family member: Secondary | ICD-10-CM

## 2021-11-07 LAB — CBC
Hematocrit: 34.1 % (ref 34.0–46.6)
Hemoglobin: 11.5 g/dL (ref 11.1–15.9)
MCH: 31.1 pg (ref 26.6–33.0)
MCHC: 33.7 g/dL (ref 31.5–35.7)
MCV: 92 fL (ref 79–97)
Platelets: 153 10*3/uL (ref 150–450)
RBC: 3.7 x10E6/uL — ABNORMAL LOW (ref 3.77–5.28)
RDW: 12.3 % (ref 11.7–15.4)
WBC: 9.7 10*3/uL (ref 3.4–10.8)

## 2021-11-07 LAB — OB RESULTS CONSOLE GC/CHLAMYDIA: Gonorrhea: NEGATIVE

## 2021-11-07 IMAGING — US US MFM OB FOLLOW-UP
1 series · 14 of 24 positions shown · non-contrast
Comparison: none

[Series 1: us mfm ob follow-up · 24 acquisitions, 14 frames shown]
[im 1/24]
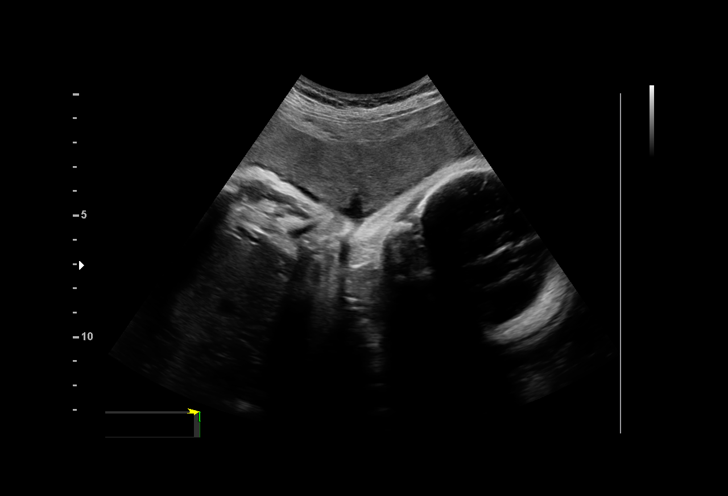
[im 3/24]
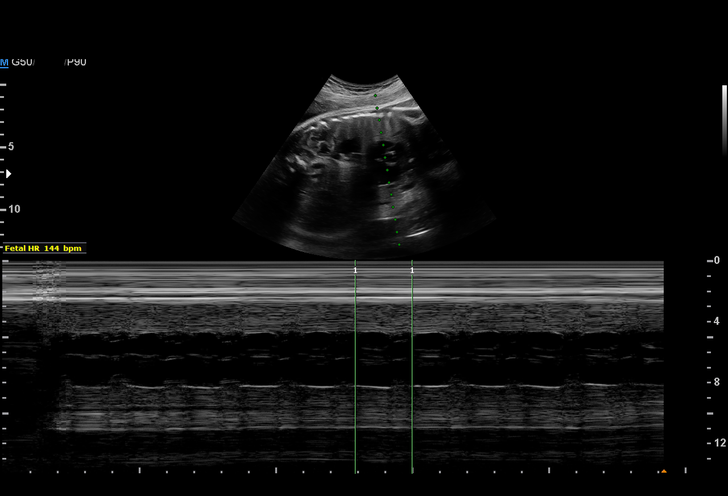
[im 5/24]
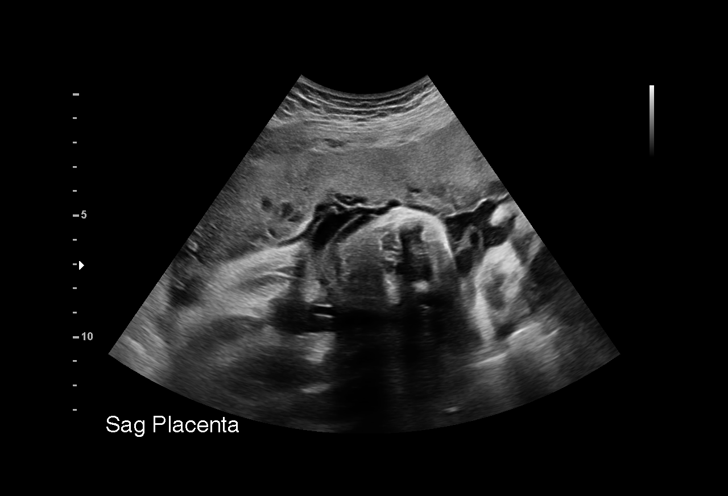
[im 7/24]
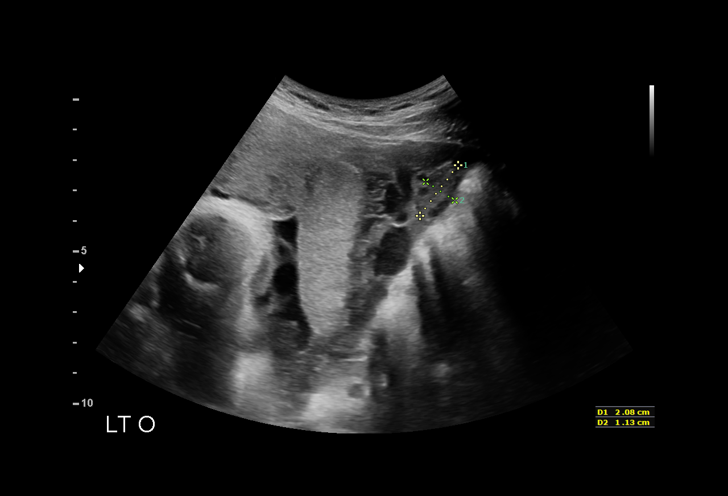
[im 8/24]
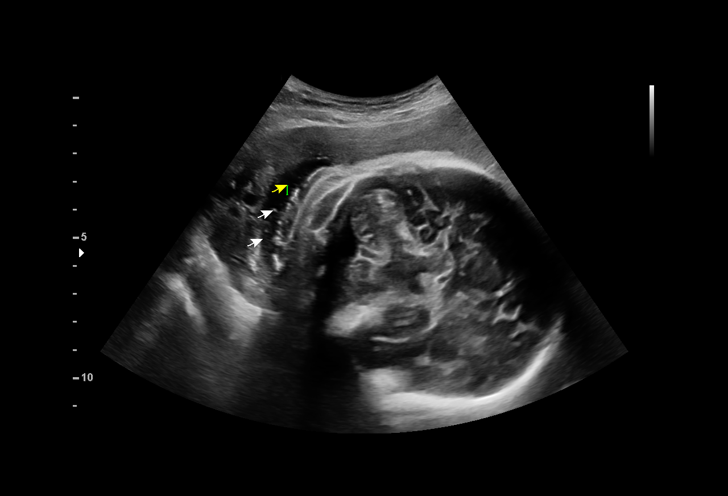
[im 10/24]
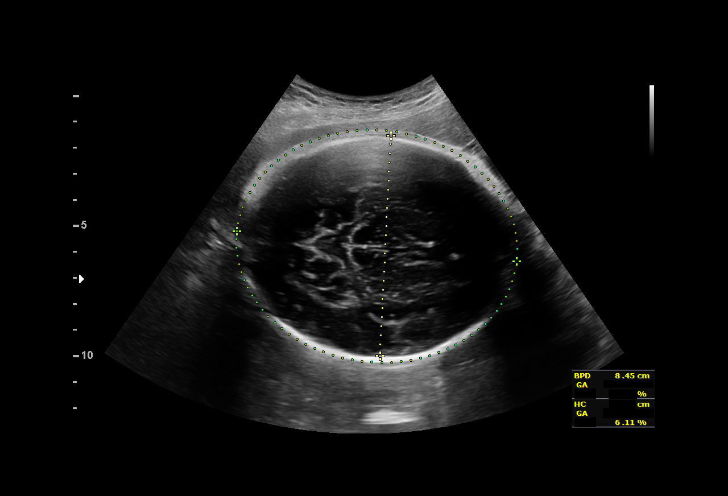
[im 12/24]
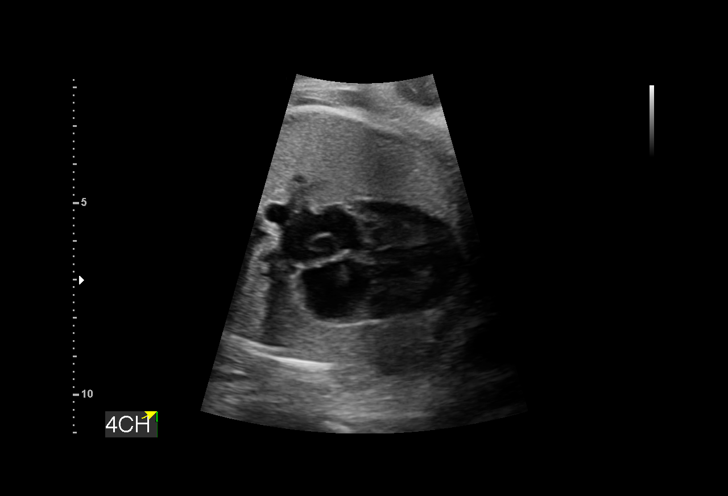
[im 13/24]
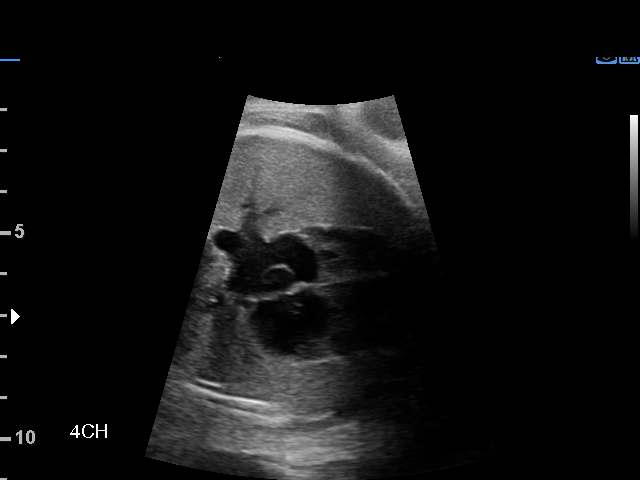
[im 15/24]
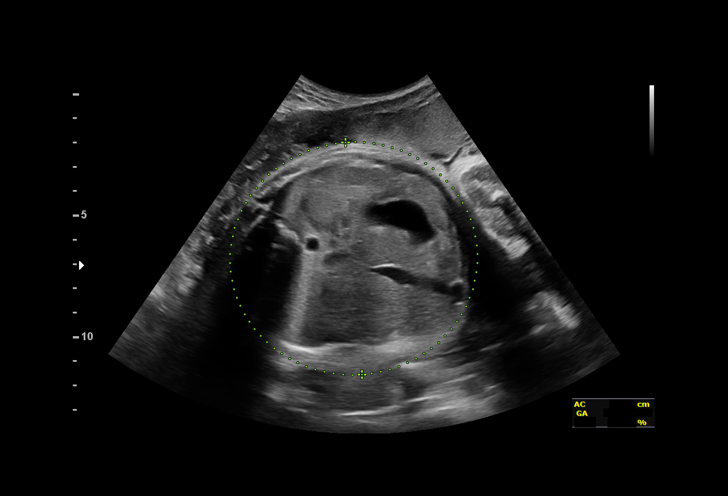
[im 17/24]
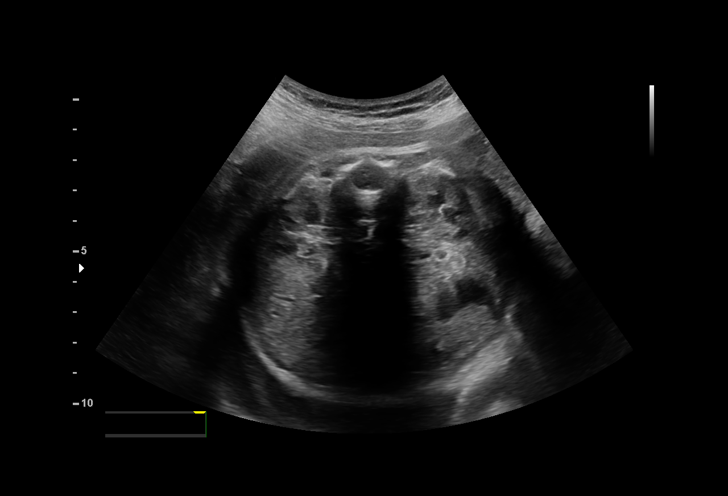
[im 19/24]
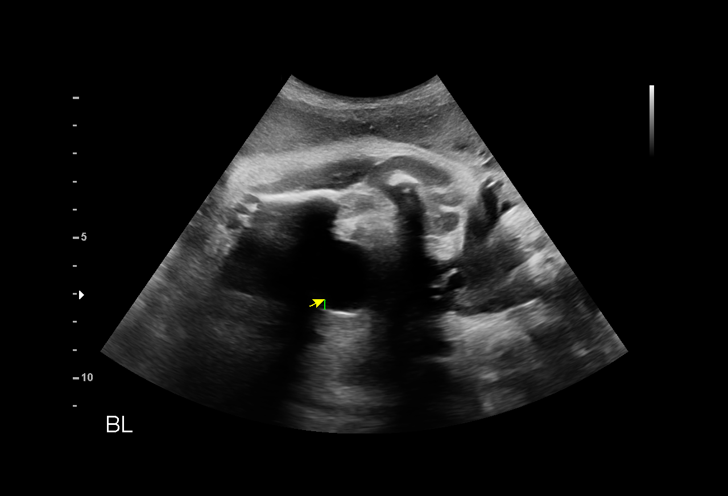
[im 20/24]
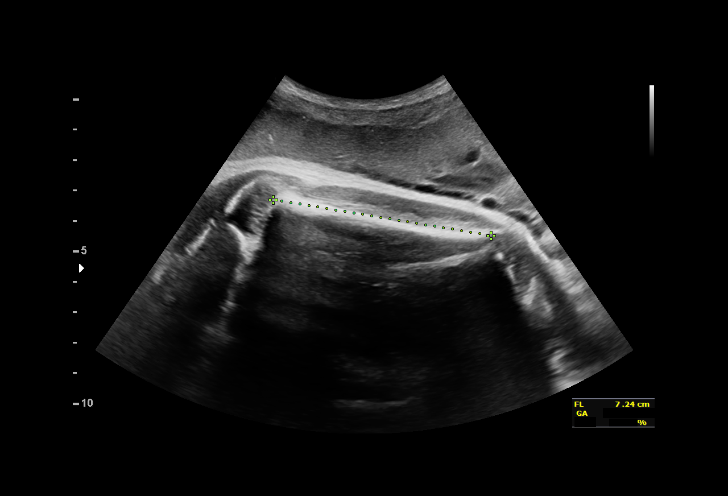
[im 22/24]
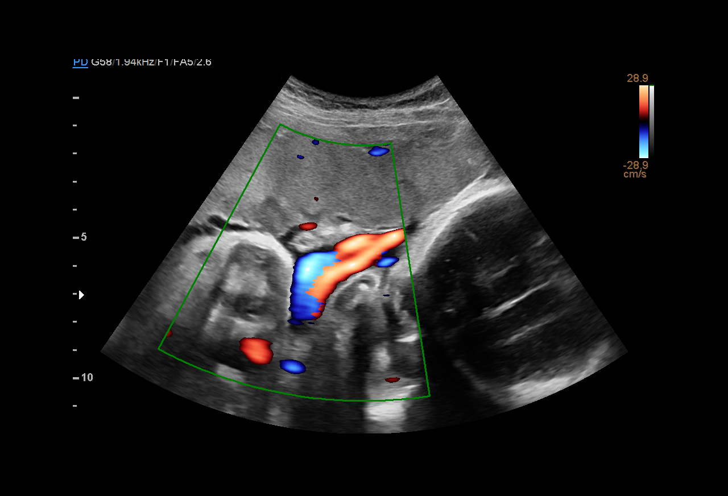
[im 24/24]
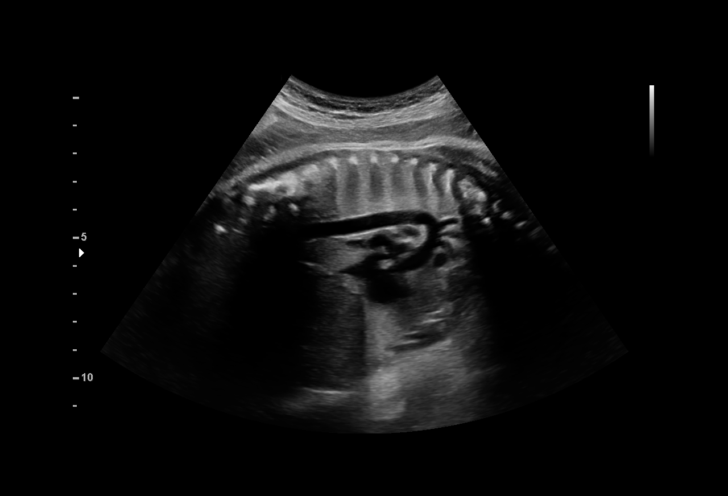

[14 of 24 positions shown; findings below may reference images not displayed]

Indications

 35 weeks gestation of pregnancy
 Poor obstetrical history; Prior neonatal
 demise (meconium aspiration)
 LR NIPS, Neg Horizon
Fetal Evaluation

 Num Of Fetuses:         1
 Fetal Heart Rate(bpm):  144
 Cardiac Activity:       Observed
 Presentation:           Cephalic
 Placenta:               Anterior
 P. Cord Insertion:      Previously Visualized

 Amniotic Fluid
 AFI FV:      Within normal limits

 AFI Sum(cm)     %Tile       Largest Pocket(cm)
 10.9            28

 RUQ(cm)       RLQ(cm)       LUQ(cm)        LLQ(cm)

Biophysical Evaluation
 Amniotic F.V:   Within normal limits       F. Tone:        Observed
 F. Movement:    Observed                   Score:          [DATE]
 F. Breathing:   Observed
Biometry

 BPD:      85.6  mm     G. Age:  34w 4d         23  %    CI:         77.1   %    70 - 86
                                                         FL/HC:      23.6   %    20.1 -
 HC:      308.7  mm     G. Age:  34w 3d        3.9  %    HC/AC:      0.99        0.93 -
 AC:      310.3  mm     G. Age:  35w 0d         37  %    FL/BPD:     85.2   %    71 - 87
 FL:       72.9  mm     G. Age:  37w 2d         83  %    FL/AC:      23.5   %    20 - 24

 LV:          4  mm

 Est. FW:    4286  gm    5 lb 15 oz      43  %
OB History

 Gravidity:    3
 Living:       1
Gestational Age

 LMP:           35w 5d        Date:  03/02/21                 EDD:   12/07/21
 U/S Today:     35w 2d                                        EDD:   12/10/21
 Best:          35w 5d     Det. By:  LMP  (03/02/21)          EDD:   12/07/21
Anatomy

 Cranium:               Appears normal         LVOT:                   Previously seen
 Cavum:                 Previously seen        Aortic Arch:            Previously seen
 Ventricles:            Appears normal         Ductal Arch:            Previously seen
 Choroid Plexus:        Previously seen        Diaphragm:              Previously seen
 Cerebellum:            Previously seen        Stomach:                Appears normal, left
                                                                       sided
 Posterior Fossa:       Previously seen        Abdomen:                Previously seen
 Nuchal Fold:           Previously seen        Abdominal Wall:         Previously seen
 Face:                  Orbits and profile     Cord Vessels:           Previously seen
                        previously seen
 Lips:                  Previously seen        Kidneys:                Appear normal
 Palate:                Previously seen        Bladder:                Appears normal
 Thoracic:              Previously seen        Spine:                  Previously seen
 Heart:                 Appears normal         Upper Extremities:      Previously seen
                        (4CH, axis, and
                        situs)
 RVOT:                  Previously seen        Lower Extremities:      Previously seen

 Other:  Nasal bone and lenses prev visualized. Fetus appears to be female.
         Heels/feet and open hands/5th digits prev visualized.
Cervix Uterus Adnexa

 Cervix
 Not visualized (advanced GA >68wks)

 Right Ovary
 Visualized.
 Left Ovary
 Visualized.
Impression

 Follow up growth due to Gestational thrombocytopenia and
 8JIVS, with prior neonatal loss due to meconium aspiration.
 Normal interval growth with measurements consistent with
 dates
 Good fetal movement and amniotic fluid volume
Recommendations

 Continue weekly testing.
 Consider delivery by 39 weeks.

## 2021-11-07 NOTE — Addendum Note (Signed)
Addended by: Henrietta Dine on: 11/07/2021 04:02 PM   Modules accepted: Orders

## 2021-11-08 ENCOUNTER — Ambulatory Visit (INDEPENDENT_AMBULATORY_CARE_PROVIDER_SITE_OTHER): Payer: Medicaid Other | Admitting: Clinical

## 2021-11-08 DIAGNOSIS — F4322 Adjustment disorder with anxiety: Secondary | ICD-10-CM

## 2021-11-08 LAB — GC/CHLAMYDIA PROBE AMP (~~LOC~~) NOT AT ARMC
Chlamydia: NEGATIVE
Comment: NEGATIVE
Comment: NORMAL
Neisseria Gonorrhea: NEGATIVE

## 2021-11-09 LAB — CULTURE, OB URINE

## 2021-11-09 LAB — URINE CULTURE, OB REFLEX

## 2021-11-15 ENCOUNTER — Other Ambulatory Visit: Payer: Self-pay

## 2021-11-15 ENCOUNTER — Encounter: Payer: Self-pay | Admitting: Obstetrics and Gynecology

## 2021-11-15 ENCOUNTER — Encounter: Payer: Medicaid Other | Admitting: Obstetrics & Gynecology

## 2021-11-15 ENCOUNTER — Other Ambulatory Visit: Payer: Medicaid Other

## 2021-11-15 ENCOUNTER — Ambulatory Visit: Payer: Medicaid Other | Admitting: General Practice

## 2021-11-15 ENCOUNTER — Ambulatory Visit (INDEPENDENT_AMBULATORY_CARE_PROVIDER_SITE_OTHER): Payer: Medicaid Other | Admitting: Obstetrics and Gynecology

## 2021-11-15 ENCOUNTER — Ambulatory Visit (INDEPENDENT_AMBULATORY_CARE_PROVIDER_SITE_OTHER): Payer: Medicaid Other

## 2021-11-15 VITALS — BP 104/62 | HR 80 | Wt 145.0 lb

## 2021-11-15 DIAGNOSIS — O24410 Gestational diabetes mellitus in pregnancy, diet controlled: Secondary | ICD-10-CM

## 2021-11-15 DIAGNOSIS — D696 Thrombocytopenia, unspecified: Secondary | ICD-10-CM

## 2021-11-15 DIAGNOSIS — O0990 Supervision of high risk pregnancy, unspecified, unspecified trimester: Secondary | ICD-10-CM | POA: Diagnosis not present

## 2021-11-15 DIAGNOSIS — Z789 Other specified health status: Secondary | ICD-10-CM

## 2021-11-15 DIAGNOSIS — Z758 Other problems related to medical facilities and other health care: Secondary | ICD-10-CM | POA: Insufficient documentation

## 2021-11-15 DIAGNOSIS — Z8709 Personal history of other diseases of the respiratory system: Secondary | ICD-10-CM

## 2021-11-15 DIAGNOSIS — R8271 Bacteriuria: Secondary | ICD-10-CM

## 2021-11-15 DIAGNOSIS — O99113 Other diseases of the blood and blood-forming organs and certain disorders involving the immune mechanism complicating pregnancy, third trimester: Secondary | ICD-10-CM

## 2021-11-15 DIAGNOSIS — O99820 Streptococcus B carrier state complicating pregnancy: Secondary | ICD-10-CM

## 2021-11-15 IMAGING — US US FETAL BPP W/ NON-STRESS
1 series · 13 of 13 positions shown · non-contrast
Comparison: none

[Series 1: us fetal bpp w/ non-stress · 13 acquisitions, 13 frames shown]
[im 1/13]
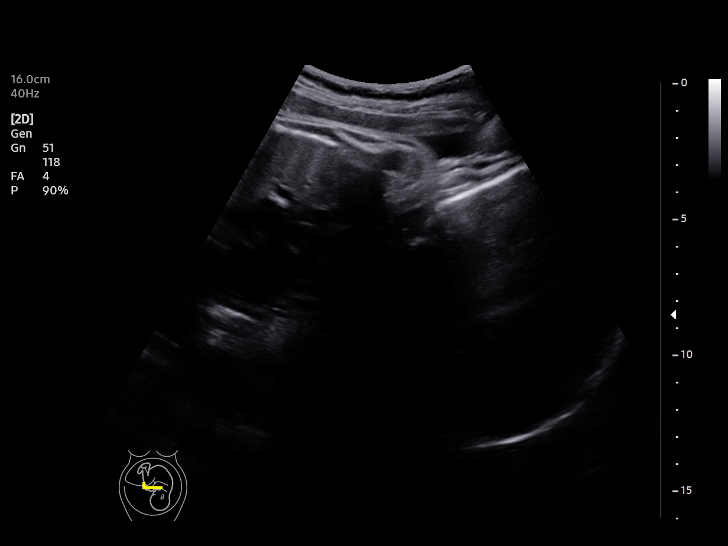
[im 2/13]
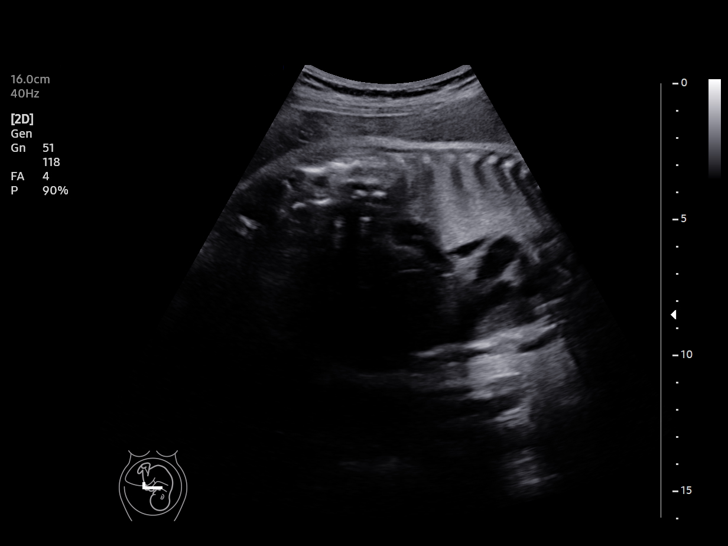
[im 3/13]
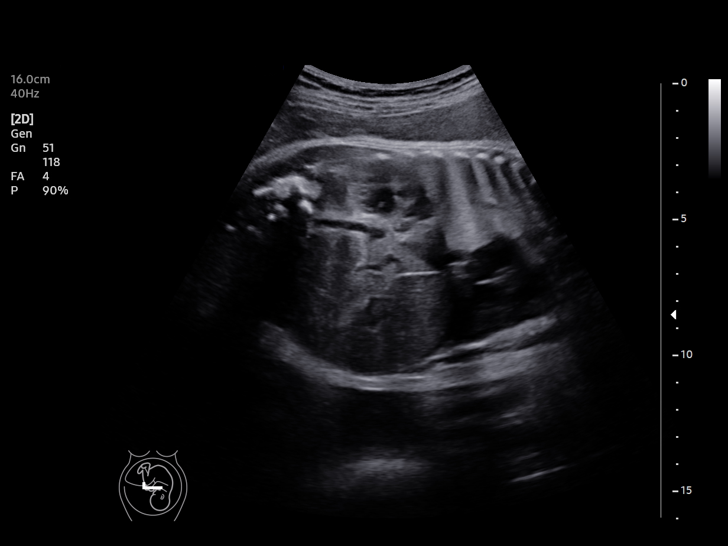
[im 4/13]
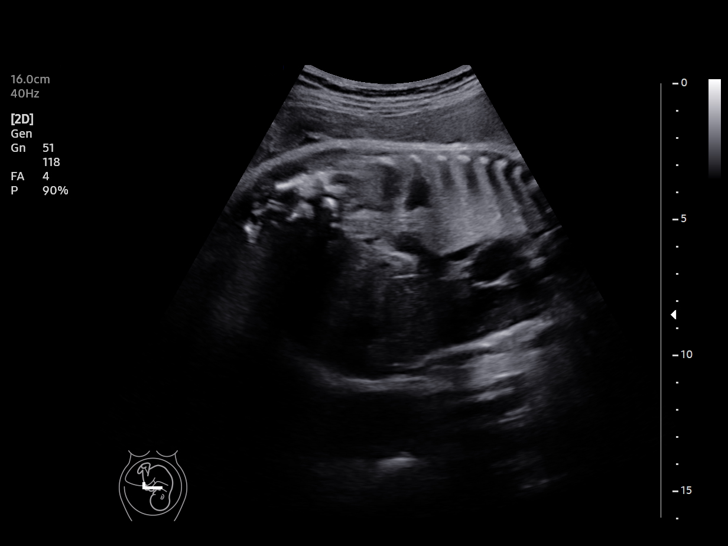
[im 5/13]
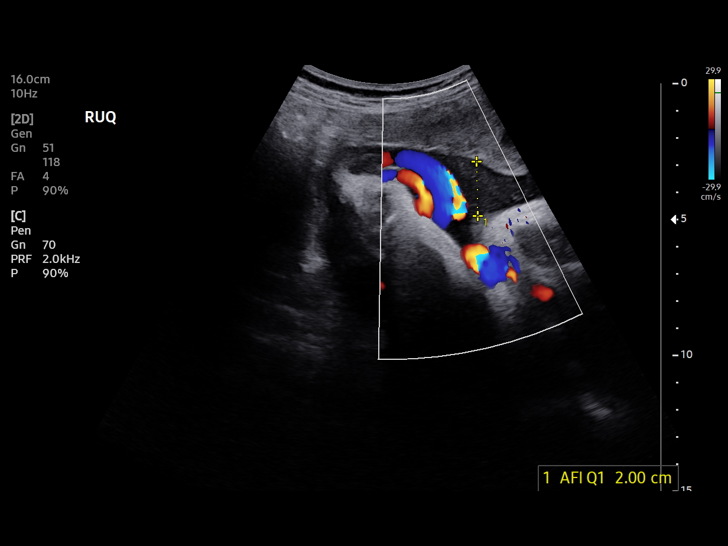
[im 6/13]
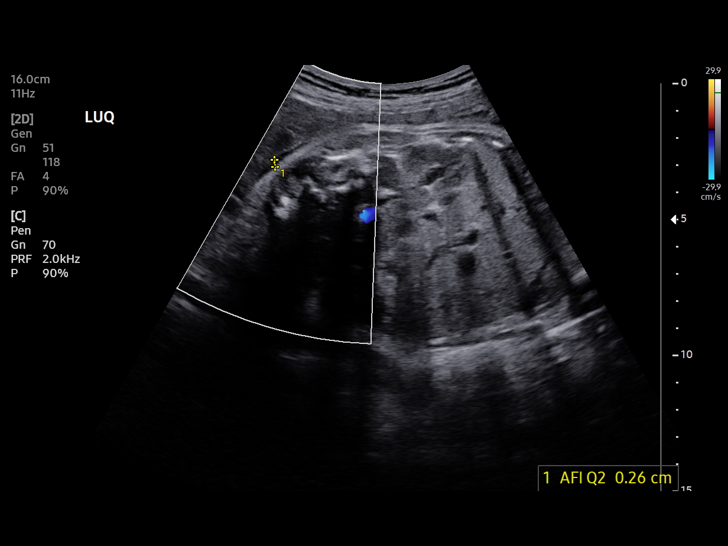
[im 7/13]
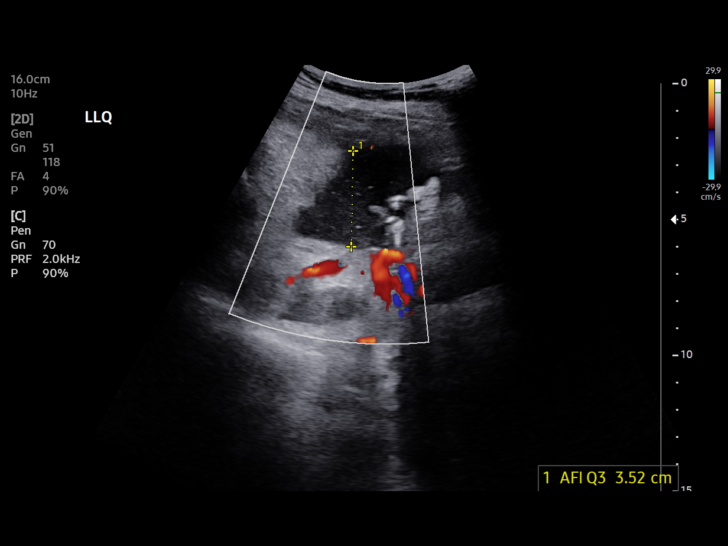
[im 8/13]
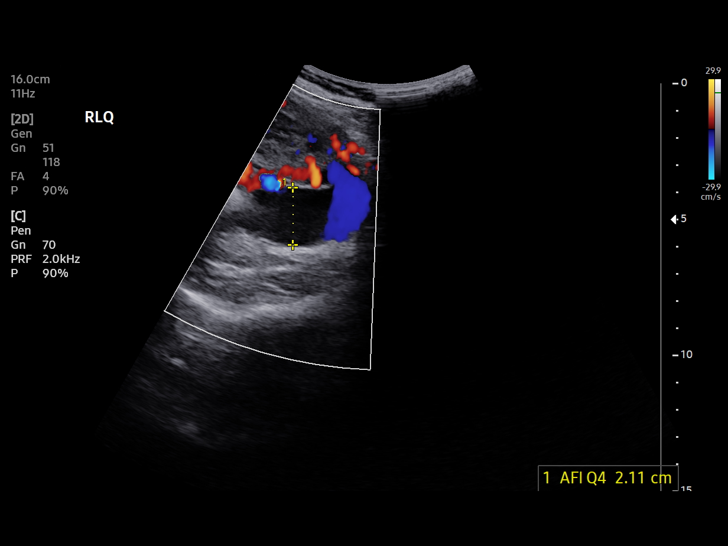
[im 9/13]
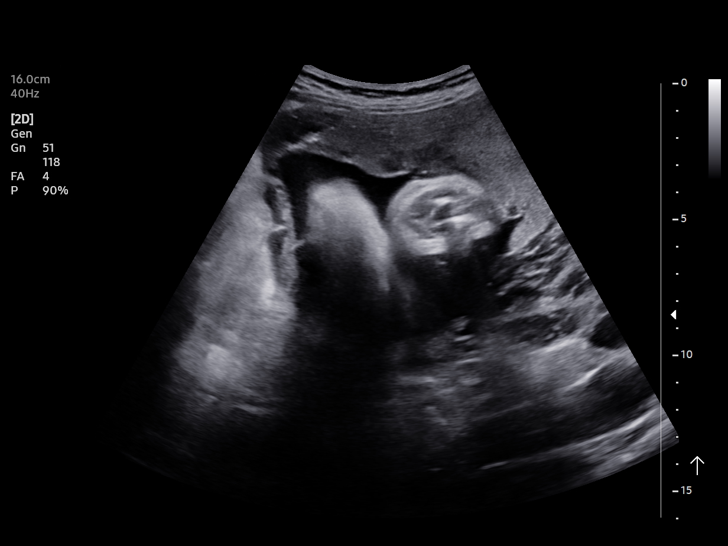
[im 10/13]
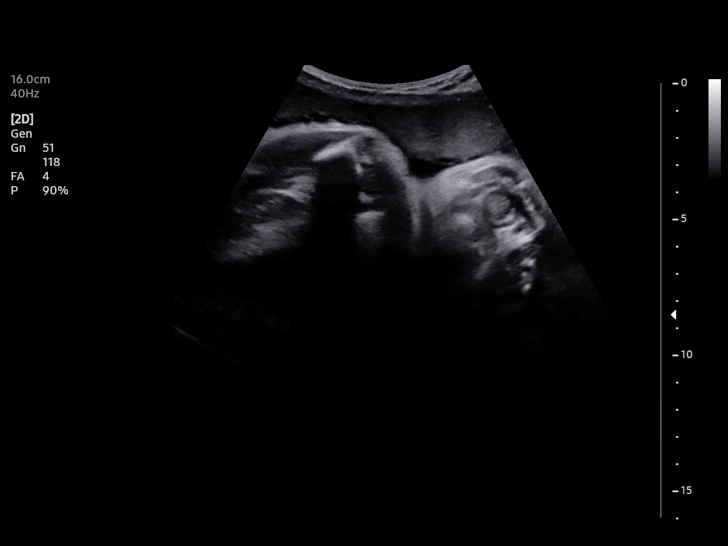
[im 11/13]
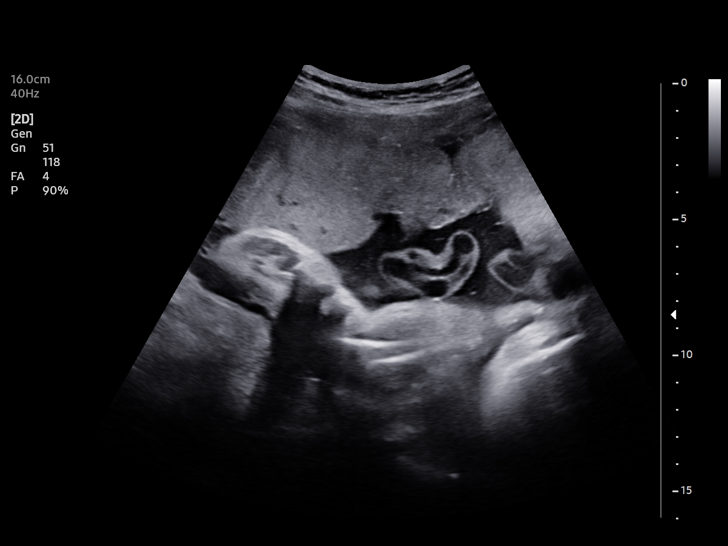
[im 12/13]
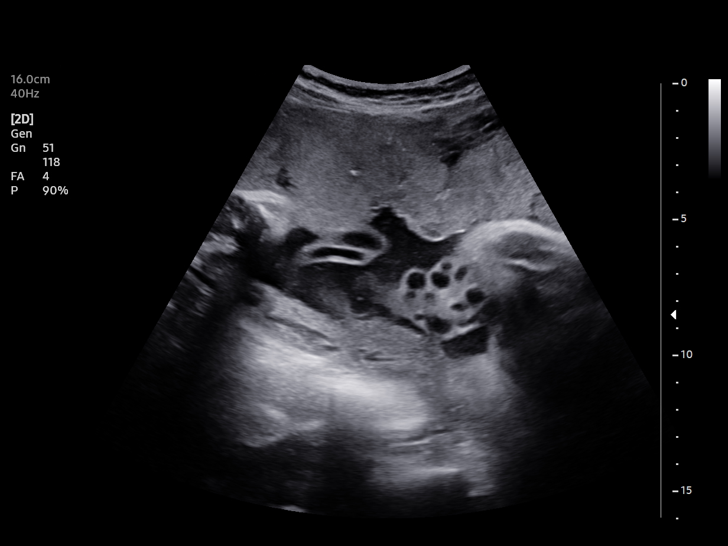
[im 13/13]
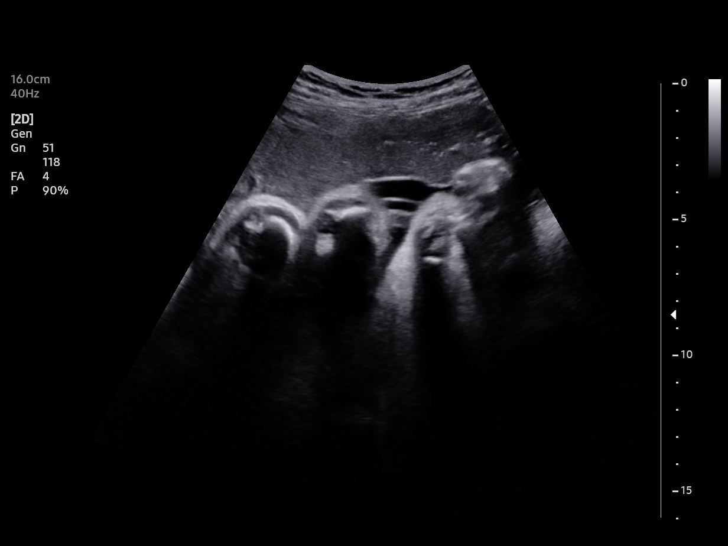

[13 of 13 positions shown; findings below may reference images not displayed]

[REDACTED]care at

 1  US FETAL BPP W/NONSTRESS              76818.4     NIKOLE KWON

Indications

 36 weeks gestation of pregnancy
 Poor obstetric history: Previous neonatal
 death
Fetal Evaluation

 Num Of Fetuses:         1
 Preg. Location:         Intrauterine
 Cardiac Activity:       Observed
 Fetal Lie:              Maternal left side
 Presentation:           Cephalic

 Amniotic Fluid
 AFI FV:      Subjectively low-normal

 AFI Sum(cm)     %Tile       Largest Pocket(cm)
 7.89            8

 RUQ(cm)       RLQ(cm)       LUQ(cm)        LLQ(cm)
 2
Biophysical Evaluation

 Amniotic F.V:   Pocket => 2 cm             F. Tone:        Observed
 F. Movement:    Observed                   N.S.T:          Reactive
 F. Breathing:   Observed                   Score:          [DATE]
OB History
 Gravidity:    3
 Living:       1
Gestational Age

 LMP:           36w 6d        Date:  03/02/21                  EDD:   12/07/21
 Best:          36w 6d     Det. By:  LMP  (03/02/21)          EDD:   12/07/21
Recommendations

 Reassuring testing. Continue antenatal survellience.
              Gio, Yigitefe

## 2021-11-15 NOTE — Progress Notes (Signed)
Subjective:  [name] is a 30 y.o. G3P2001 at [redacted]w[redacted]d being seen today for ongoing prenatal care.  She is currently monitored for the following issues for this high-risk pregnancy and has Supervision of high risk pregnancy, antepartum; History of retained placenta in prior pregnancy, currently pregnant; Prior delivery with meconium aspiration; GBS bacteriuria; Loss of infant; History of postpartum endometritis; Gestational thrombocytopenia ([place]); GDM (gestational diabetes mellitus); Low vitamin D level; and Language barrier on their problem list.  Patient reports general discomforts of pregnancy.  Contractions: Not present. Vag. Bleeding: None.  Movement: (!) Decreased. Denies leaking of fluid.   The following portions of the patient's history were reviewed and updated as appropriate: allergies, current medications, past family history, past medical history, past social history, past surgical history and problem list. Problem list updated.  Objective:   Vitals:   11/15/21 1458  BP: 104/62  Pulse: 80  Weight: 145 lb (65.8 kg)    Fetal Status: Fetal Heart Rate (bpm): NST   Movement: (!) Decreased     General:  Alert, oriented and cooperative. Patient is in no acute distress.  Skin: Skin is warm and dry. No rash noted.   Cardiovascular: Normal heart rate noted  Respiratory: Normal respiratory effort, no problems with respiration noted  Abdomen: Soft, gravid, appropriate for gestational age. Pain/Pressure: Present     Pelvic:  Cervical exam deferred        Extremities: Normal range of motion.  Edema: Trace  Mental Status: Normal mood and affect. Normal behavior. Normal judgment and thought content.   Urinalysis:      Assessment and Plan:  Pregnancy: G3P2001 at [redacted]w[redacted]d  1. Supervision of high risk pregnancy, antepartum Stable Labor precautions  2. Diet controlled gestational diabetes mellitus (GDM), antepartum CBG's in goal range Continue with serial growth scans and antenatal  testing Growth 43 % on 11/07/21  3. Benign gestational thrombocytopenia in third trimester ([place]) Plts 153 on 11/07/21  4. Prior delivery with meconium aspiration Has decided against planned c section now. Desires IOL instead. Will schedule for 11/30/21. Pt made aware unable confirm specific MD's and NMW for delivery but will try to accommodate her requests  5. GBS bacteriuria Tx while in labor  6. Language barrier Video interrupter used during today's visit      Term labor symptoms and general obstetric precautions including but not limited to vaginal bleeding, contractions, leaking of fluid and fetal movement were reviewed in detail with the patient. Please refer to After Visit Summary for other counseling recommendations.  Return in about 1 week (around 11/22/2021) for OB visit, face to face, MD only.   Chancy Milroy, MD

## 2021-11-15 NOTE — Patient Instructions (Signed)
??????? ?????? ??? ??????? ???????? Cesarean Delivery, Care After ????????? ??????? ??????? ??????? ?????? ??? ???????. ????? ????? ???? ??????? ?????? ????? ??????? ???? ???????. ???? ???? ????? ?????? ?? ?????? ??????? ??????? ????? ??????? ?????? ????? ???. ???? ?????? ??? ??????? ???????? ?? ?????? ???? ?? ??? ??? ??????? ???????:  ????? ???? ?? ???? ?? ?????? ?????? ?? ????.  ??? ????????? ?????? ?????? ?? ???? ???? ???????.  ???? ?? ????? ??????? ???????.  ???? ????? (?????? ?? ?????? ???????). ???? ??? ??????? ???????? ????? ??? ????? ????????.  ?????? ?? ?????.  ???????. ?? ?????? ???? ????? ????? ???? ?? ??????? ???????? ??? ???? ?????? ?????? (??????)? ???? ?? ??????? ???????:  ??? ??????? ??????? ???????? ????? ????? ?????? ?????? ??????? ??? ?????? ??????? ????????.  ????? ?? ?? ??? ??????? (??? ?????) ?? ???? ?????? ?????? ?? ????? ?????? ??????? ????????. ????? ??? ????????? ?? ??????: ???????  ?????? ??????? ??? ?? ?????? ?? ???? ??????? ?????? ????? ???? ????? ????? ????? ???? ?? ??? ???? ????.  ??? ??? ??? ????? ??????? ?????? ?????? ??????? ???????? ??? ????????. ?? ?????? ?? ????? ?????? ??????? ??? ??? ???? ?????? ????? ??????.  ????? ???? ??????? ?????? ??????? ??? ??? ??? ??? ?????? ??????? ??? ????? ???? ???? ??????? ?? ??????? ??????. ??????? ?????   ????? ??????? ???? ??????? ?????? ???? ????? ????? ?????. ????? ???: ? ??? ????? ?????? ???????? ???? 20 ????? ??? ????? ??? ????? ???? ?????? (???????) ???? ??????. ???? ?? ????? ????? ????????? ??????? ???? ??????. ? ?? ???? ??? ????? ??? ?????? ?????? ??? ??????? ??? ??????? ??????? ??????? ?????? ??????? ???. ? ????? ????? (??????) ?? ????? ????? ?? ??? ????? ?? ??????? ??????? ?? ??????? ?? ????. ?????? ?????? ?????? ????? ??? ??????? ??? ????? ???? ??????? ?? ????. ???? ???? ???? ??????? ??????? ????? ??????? ??????? ?? ?????? ?????????. ?? ????? ?? ????? ??????? ??????? ???? ?? ?? ?????? ???? ??????? ??????  ????.  ????? ????? ???? ?????? ?????? ?? ???? ?????? ????? ??? ??????? ?????. ?????? ??? ???: ? ?????? ?? ???????? ?? ?????? ?? ?????. ? ?????? ?? ??????? ?? ????. ? ?????. ? ???? ??? ?? ????? ?????.  ?? ?????? ?? ????? ?? ??????? ???? ???? ??? ??? ?????? ????? ??????? ??????. ??????? ???? ??????? ?????? ?? ??? ?????? ?????????.  ??? ?????? ?? ?????? ??? ????. ????? ??? ?? ????? ????? ???????? ??? ????? ?? ???????? ??? ???? ??????? (??????). ????? ??? ??? ??? ????? ??? ???????. ?????? ??? ??????? ?? ???? ??? ???????? ???? ?????. ???????? ?? ??????? ?? ????? ?? ???? ?????? ????? ?? ??????? ???:  ????? ????? ????? ?? ??????? ???? ??? ????? ???? ??????.  ????? ????? ????? ??? ???? ???? ?? ????? ????.  ????? ??????? ?????? ???????? ??? ????????? ??????? ??????? ???????? ?????????? ???????.  ????? ????? ??????? ???? ????? ??? ??? ????? ?? ?????? ????????? ??????? ??? ??????? ??????? ?? ????????. ??????   ??? ???????? ???? ????? ??? ??? ???? ????????? ?? ????? ????? ?????? ??????? ???? ????? ???? ??? ????? ??? ?????? ?? ????????.  ?????? ??????? ??? ????????. ????? ??? ??? ?? ?????? ?? ???????? ?? ????? ??? ??????.  ?? ??????? ??? ???? ??? ?? ???. ???????? ??????? ??????? ?????? ?????? ????? ???? ???????? ???? ?????.  ?????? ?????? ?????? ??????? ??? ??????? ??????? ??????? ??????. ???????? ???? ??????? ?????? ???? ??????? ?????? ???.  ??????? ?? ????? ??????? ?????? ??? ?? ???? ??????? ??????? ?????? ??????. ?? ????? ??? ???: ? ??? ????? ??????? ?????. ? ???? ???? ?????? ???? ???????. ? ???????? ??????? ?? ?????? ??????. ??? ??????  ?? ??????? ????????? ???????? ???????. ???? ??? ??? ???? ?? ???? ??????? ???????? ?? ????? ????? ?????.  ?? ??????? ?????? ????? ??? ????????? ?? ?????. ????? ??? ??????? ???? ????? ?????? ??????? ???????????? ??? ??????? ???????????. ??????? ???? ??????? ?????? ??? ???? ????? ??? ???????? ??????? ?? ???????. ??????? ????  ?? ??????? ?????? ?? ??? ??? ??? ??????  ????? ??????? ??????.  ????? ????? ?????? ????? ?????? ??? ??? ?????? ???? ?? ????? ???????.  ???? ???????? ??? ???? ?????? ?????? ??? ?????? ??? ??????? ??????? ??????. ?? ?????? ?? ??????? ??????? ??? ???? ??????? ?????? ?? ??????? ??????? ?????? ??????? ???.  ?????? ????? ?????? ???????? ??????? ??? ???????. ???? ??? ???. ????? ????? ??????? ?????? ?? ??????? ???????:  ??????? ??? ??? ???: ? ??????? ??????. ? ???? ?? ???? ????. ? ??????? ?????? ??? ????? ?????. ? ???? ???? ????? ?? ??????. ? ???? ???? ?? ?? ?? ????? ????? ?? ???? ???????. ? ?????? ???? ???? ???? ??????? ??? ????. ? ????? ???????? ?? ?????? ?? ????? ??? ????. ? ????? ?????? ?? ?????? ??? ?? ????? ??????. ? ??????? ?? ??????. ? ??? ?? ????? ???????? ??????? ??????? ???? ???? ???????? ??? ?? ???.  ?????? ??? ????? ?????? ?? ?????? ???? ?? ????? ???.  ???? ???? ?? ??? ??? ??? ??????.  ???? ????? ?? ??????? ??? ??????? ?????? ?? ??????.  ???? ?????? ????? ???? ?? ??? ???????? ?? ???? ???????. ????? ???????? ????? ?? ??????? ???????:  ??????? ??? ??? ???: ? ??? ?? ????? ??? ????? ????????. ? ??? ?? ?????. ? ????? ??????. ? ??? ?????? ????? ?? ???? ??? ?? ???? ?? ???? ??????. ? ???????? ????? ???? ????? ????? ?? ??????. ? ??? ???? ?? ????? ?? ??? ?????. ? ?????? ????? ???? ?? ??? ??? ????? ????????.  ??????? ???????.  ??????? ???? ????? ???? ?????? ??????? ?????? ???? ?? ???? ???? ????? ?? ????. ????? ??? ???? ????? ???? ????? ?? ?????? ??????? ?????? ?????. ??? ???? ??? ??????? ????? ??? ???? ???? ?????. ????? ???????? ?????. ????? ??? ???  911.  ?? ?????? ?? ??? ???? ??????? ????? ?? ??.  ?? ????? ??????? ?????? ??? ????????. ????? ???????? ????? ??? ????? ???? ?? ????? ???? ?? ????? ?? ??? ????? ???? ?????? ?? ????????. ????? ??? ??? ??????? ?? ???? ?????? ??:  ????? ??? ??? 911.  ????? ??? ?????? ?????? ??????? ?? ???????? (National Suicide Prevention Lifeline) ??? ????? ?[phone] ?? 988. ????? ??? ???? ???  ???? 24 ???? ??????.  ????? ????? ??? ?? ??????? (Text the Crisis Text Line?) ??? ????? [id number]. ????  ????? ??? ???????? ???? ??? ?? ???? ???? ??????? ??????? ?? ????? ????? ???? ?? ??????.  ????? ????? ???? ?????? ?????? ?? ???? ?????? ????? ??? ??????? ?????.  ?????? ??????? ??????? ?????? ??????? ?? ??? ????? ??? ?????? ???? ??.  ?????? ????? ?????? ???????? ??????? ??? ???????. ???? ??? ???. ??? ????? ?? ??? ????????? ?? ???? ?????? ????????? ???? ?????? ???? ??????? ??????. ???? ?? ?????? ??? ????? ???? ?? ???? ?? ???? ??????? ??????.? Document Revised: 05/09/2021 Document Reviewed: 05/09/2021 Elsevier Patient Education  2022 [employer].

## 2021-11-15 NOTE — Progress Notes (Signed)
IOL scheduled for 11/30/21 am

## 2021-11-15 NOTE — Progress Notes (Signed)
Pt informed that the ultrasound is considered a limited OB ultrasound and is not intended to be a complete ultrasound exam.  Patient also informed that the ultrasound is not being completed with the intent of assessing for fetal or placental anomalies or any pelvic abnormalities.  Explained that the purpose of todays ultrasound is to assess for  BPP, presentation, and AFI.  Patient acknowledges the purpose of the exam and the limitations of the study.     Chase Caller RN BSN 11/15/21

## 2021-11-19 ENCOUNTER — Telehealth (HOSPITAL_COMMUNITY): Payer: Self-pay | Admitting: *Deleted

## 2021-11-19 NOTE — Telephone Encounter (Signed)
Preadmission screen  

## 2021-11-21 ENCOUNTER — Ambulatory Visit (INDEPENDENT_AMBULATORY_CARE_PROVIDER_SITE_OTHER): Payer: Medicaid Other | Admitting: Family Medicine

## 2021-11-21 ENCOUNTER — Other Ambulatory Visit: Payer: Self-pay

## 2021-11-21 ENCOUNTER — Encounter (HOSPITAL_COMMUNITY): Payer: Self-pay | Admitting: *Deleted

## 2021-11-21 ENCOUNTER — Ambulatory Visit (INDEPENDENT_AMBULATORY_CARE_PROVIDER_SITE_OTHER): Payer: Medicaid Other

## 2021-11-21 ENCOUNTER — Telehealth (HOSPITAL_COMMUNITY): Payer: Self-pay | Admitting: *Deleted

## 2021-11-21 VITALS — BP 107/71 | HR 82

## 2021-11-21 DIAGNOSIS — Z8709 Personal history of other diseases of the respiratory system: Secondary | ICD-10-CM | POA: Diagnosis not present

## 2021-11-21 DIAGNOSIS — Z8759 Personal history of other complications of pregnancy, childbirth and the puerperium: Secondary | ICD-10-CM

## 2021-11-21 DIAGNOSIS — O0990 Supervision of high risk pregnancy, unspecified, unspecified trimester: Secondary | ICD-10-CM

## 2021-11-21 DIAGNOSIS — Z3A37 37 weeks gestation of pregnancy: Secondary | ICD-10-CM

## 2021-11-21 IMAGING — US US FETAL BPP W/ NON-STRESS
1 series · 13 of 13 positions shown · non-contrast
Comparison: none

[Series 1: us fetal bpp w/ non-stress · 13 acquisitions, 13 frames shown]
[im 1/13]
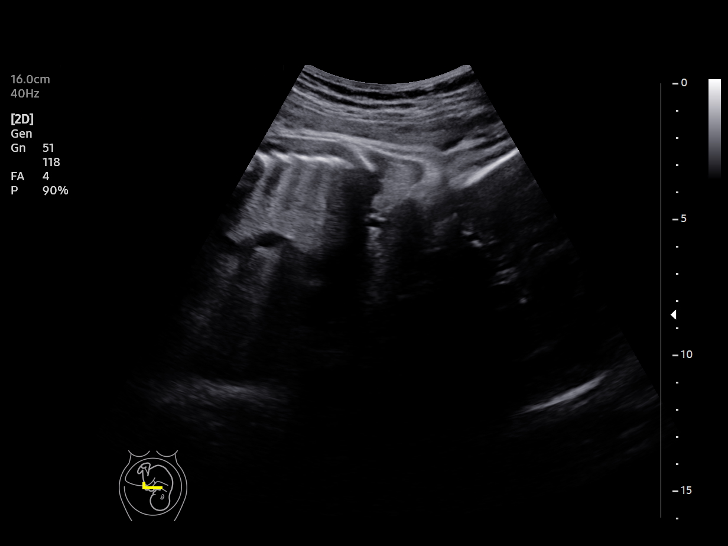
[im 2/13]
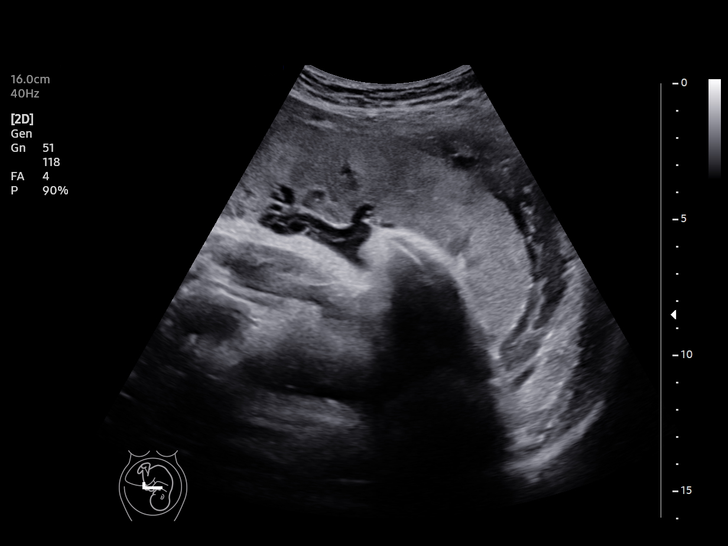
[im 3/13]
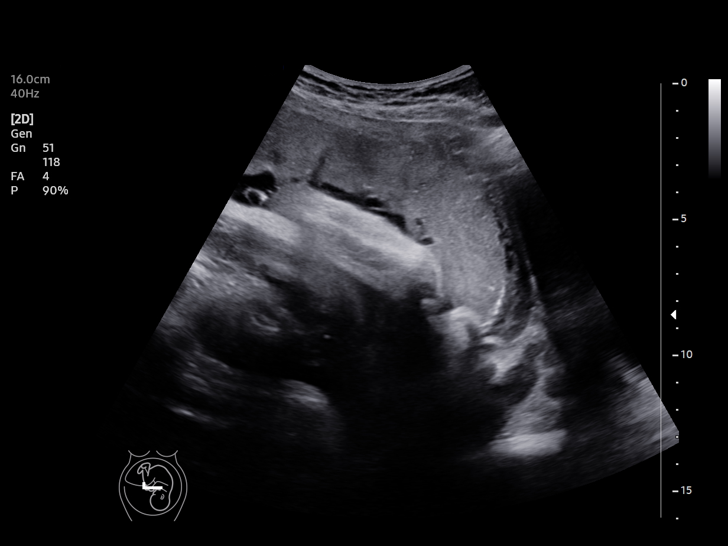
[im 4/13]
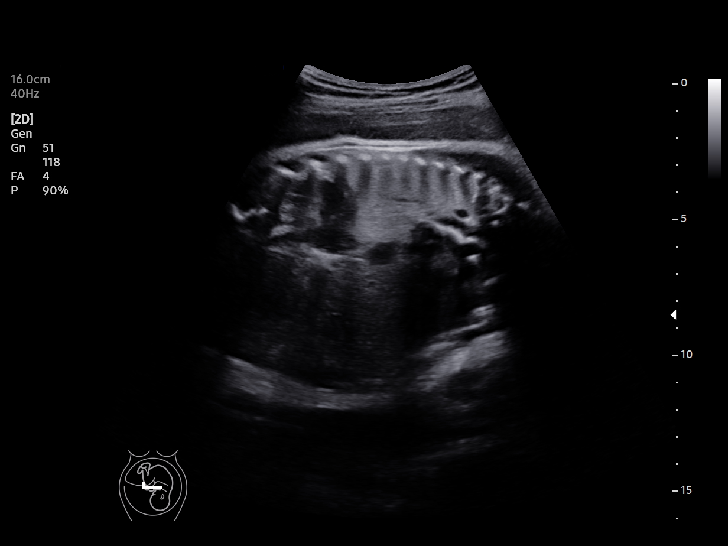
[im 5/13]
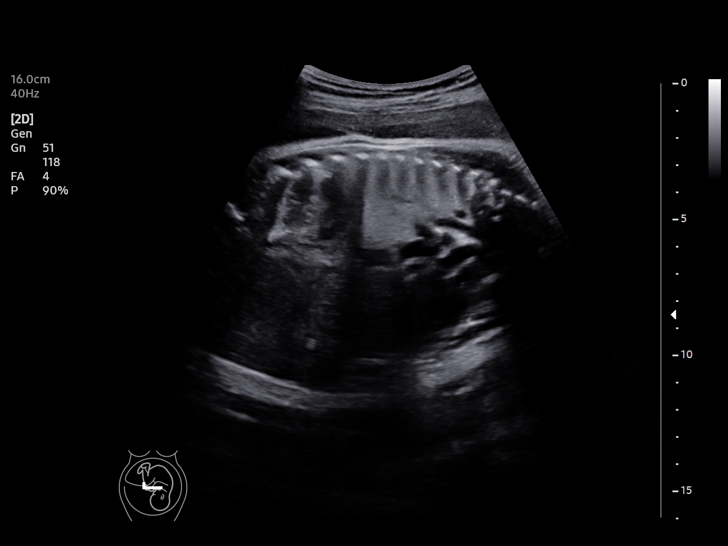
[im 6/13]
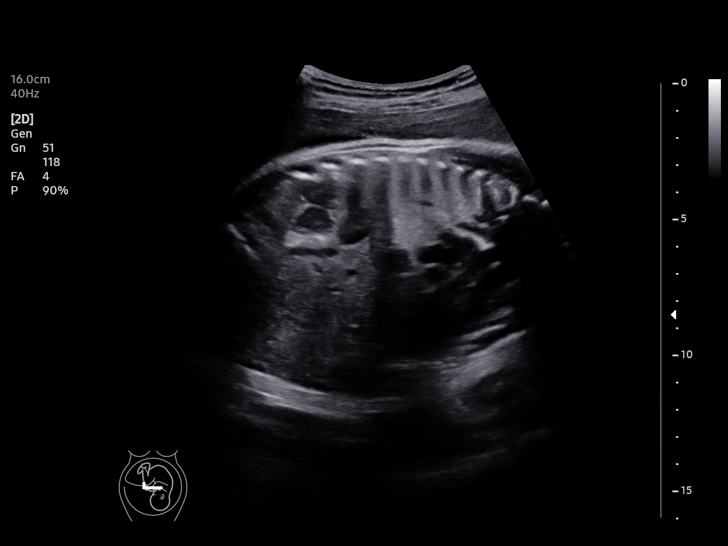
[im 7/13]
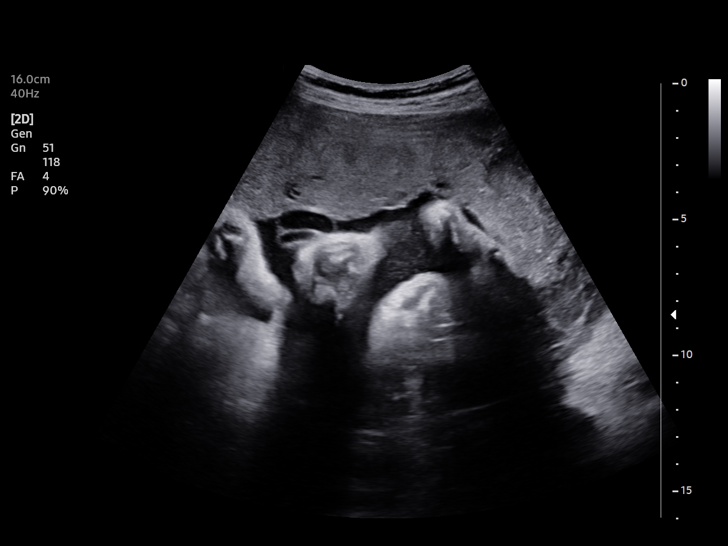
[im 8/13]
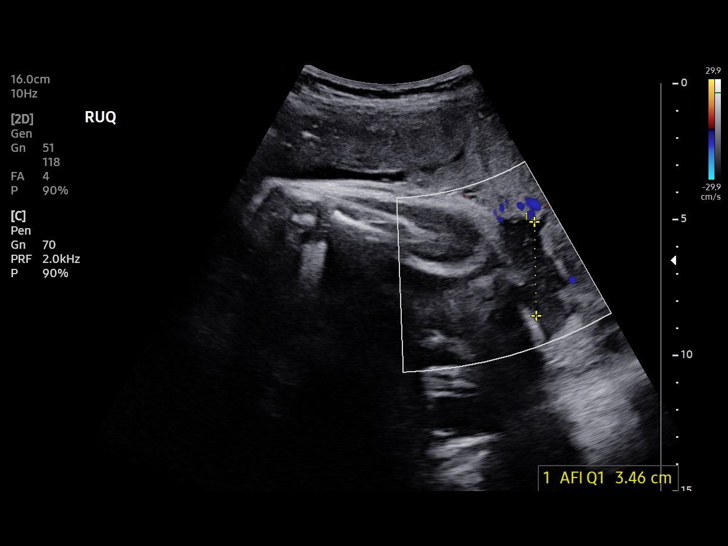
[im 9/13]
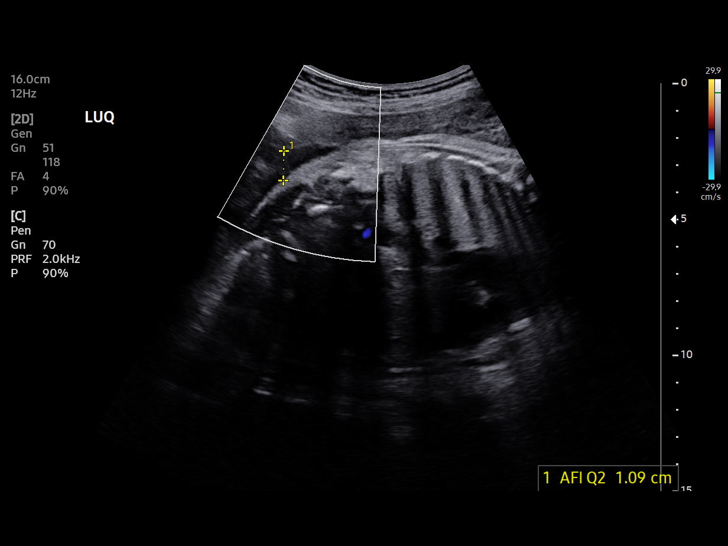
[im 10/13]
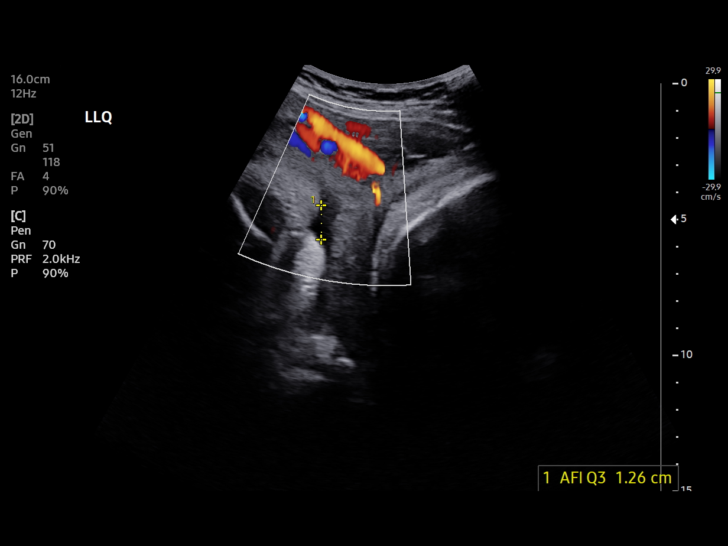
[im 11/13]
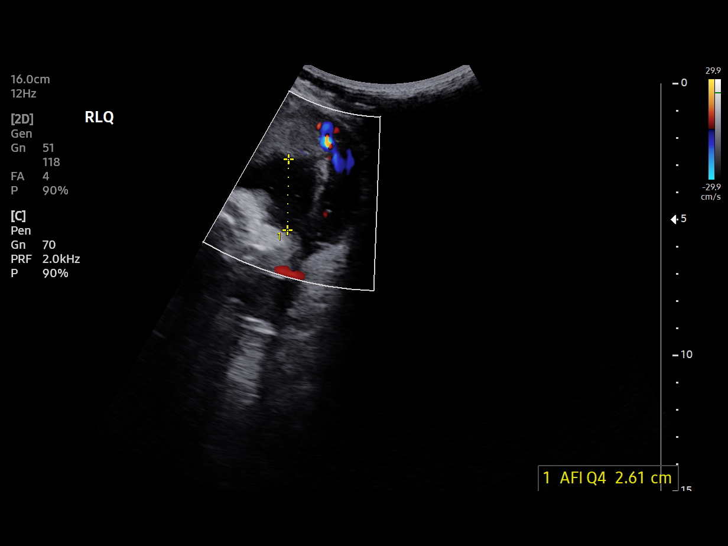
[im 12/13]
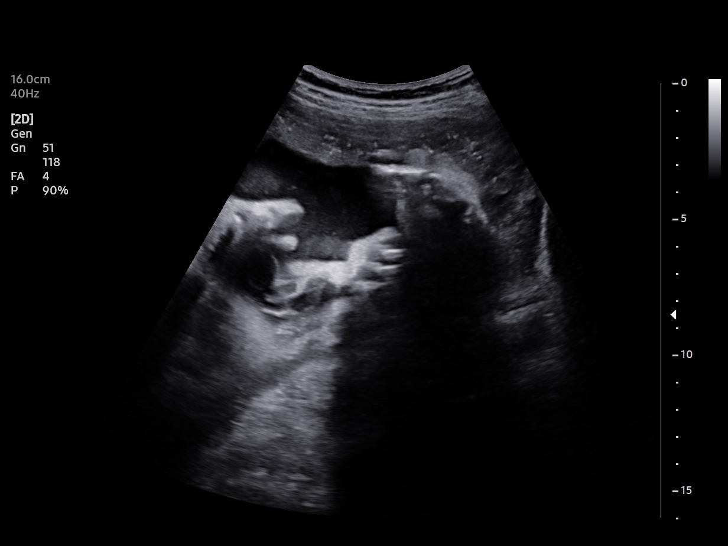
[im 13/13]
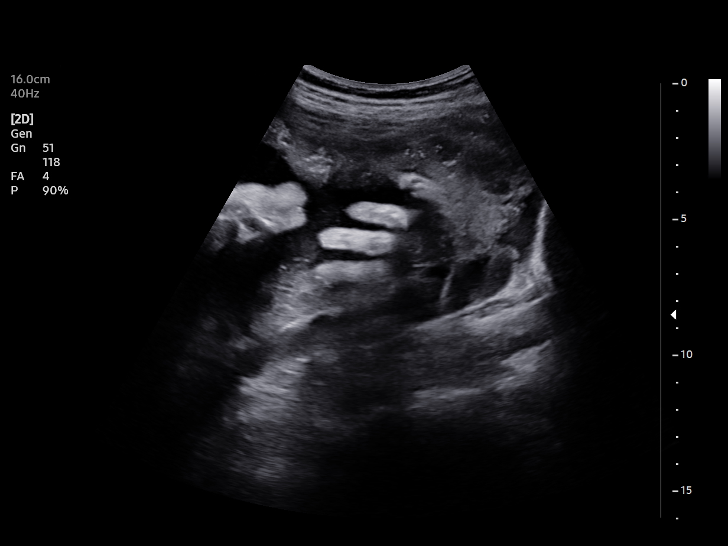

[13 of 13 positions shown; findings below may reference images not displayed]

[REDACTED]care at

Indications

 37 weeks gestation of pregnancy
 Poor obstetrical history (specify) neonatal
 demise
Fetal Evaluation

 Num Of Fetuses:         1
 Preg. Location:         Intrauterine
 Cardiac Activity:       Observed
 Fetal Lie:              Maternal right side
 Presentation:           Cephalic

 Amniotic Fluid
 AFI FV:      Within normal limits

 AFI Sum(cm)     %Tile       Largest Pocket(cm)
 8.42            13

 RUQ(cm)       RLQ(cm)       LUQ(cm)        LLQ(cm)

Biophysical Evaluation

 Amniotic F.V:   Pocket => 2 cm             F. Tone:        Observed
 F. Movement:    Observed                   N.S.T:          Reactive
 F. Breathing:   Observed                   Score:          [DATE]
OB History
 Gravidity:    3
 Living:       1
Gestational Age

 LMP:           37w 6d        Date:  03/02/21                  EDD:   12/07/21
 Best:          37w 6d     Det. By:  LMP  (03/02/21)          EDD:   12/07/21
Recommendations

 Normal antenatal testing.  Continue testing. IOL at 39 weeks
              Boniface, Shidee

## 2021-11-21 NOTE — Telephone Encounter (Signed)
Preadmission screen  

## 2021-11-21 NOTE — Progress Notes (Signed)
Pt informed that the ultrasound is considered a limited OB ultrasound and is not intended to be a complete ultrasound exam.  Patient also informed that the ultrasound is not being completed with the intent of assessing for fetal or placental anomalies or any pelvic abnormalities.  Explained that the purpose of todays ultrasound is to assess for  BPP, presentation, and AFI.  Patient acknowledges the purpose of the exam and the limitations of the study.     Koren Bound RN BSN 11/21/21

## 2021-11-21 NOTE — Progress Notes (Addendum)
° °  PRENATAL VISIT NOTE  Subjective:  [name] is a 31 y.o. G3P2001 at [redacted]w[redacted]d being seen today for ongoing prenatal care.  She is currently monitored for the following issues for this high-risk pregnancy and has Supervision of high risk pregnancy, antepartum; History of retained placenta in prior pregnancy, currently pregnant; Prior delivery with meconium aspiration; GBS bacteriuria; Loss of infant; History of postpartum endometritis; Gestational thrombocytopenia ([place] [employer]); GDM (gestational diabetes mellitus); Low vitamin D level; and Language barrier on their problem list.  Patient reports  having irregular contractions, reports constipation and then recent loose stools. She is also tired and feels like her baby might come before her IOL .  Contractions: Irritability. Vag. Bleeding: None.  Movement: Present. Denies leaking of fluid.   The following portions of the patient's history were reviewed and updated as appropriate: allergies, current medications, past family history, past medical history, past social history, past surgical history and problem list.   Objective:   Vitals:   11/21/21 1541  BP: 107/71  Pulse: 82    Fetal Status: Fetal Heart Rate (bpm): NST Fundal Height: 37 cm Movement: Present  Presentation: Vertex  General:  Alert, oriented and cooperative. Patient is in no acute distress.  Skin: Skin is warm and dry. No rash noted.   Cardiovascular: Normal heart rate noted  Respiratory: Normal respiratory effort, no problems with respiration noted  Abdomen: Soft, gravid, appropriate for gestational age.  Pain/Pressure: Present     Pelvic: Cervical exam performed in the presence of a chaperone Dilation: 2 Effacement (%): Thick Station: -3  Extremities: Normal range of motion.     Mental Status: Normal mood and affect. Normal behavior. Normal judgment and thought content.   Assessment and Plan:  Pregnancy: G3P2001 at [redacted]w[redacted]d 1. Prior delivery with meconium aspiration Confirmed  patient's desire for IOL instead of primary CS- on 2/24 Discussed when to go to the hospital Patient is dilated and instructed to go with any increase in intensity Reviewed that only [facility] staff will be involved in her care-- she does not want to see any members from the other practice that were involved in her care last pregnancy. She reports this will "stress" her and I offered validation and understanding. Seen by community OB practice [facility] CNM, [employer] [place] CNM, Drema Dallas DO) I reassured her that they should not be involved in her care this delivery. Prefers to have providers she has met  - US FETAL BPP W/NONSTRESS; Future  2. History of neonatal death - US FETAL BPP W/NONSTRESS; Future  Term labor symptoms and general obstetric precautions including but not limited to vaginal bleeding, contractions, leaking of fluid and fetal movement were reviewed in detail with the patient. Please refer to After Visit Summary for other counseling recommendations.   Return in 1 week (on 11/28/2021) for NST/BPP.  Future Appointments  Date Time Provider [place]  11/29/2021  2:15 PM [facility]  11/30/2021  6:30 AM [facility] SCHED ROOM [facility] None    Caren Macadam, MD

## 2021-11-26 ENCOUNTER — Other Ambulatory Visit (HOSPITAL_COMMUNITY): Payer: Self-pay | Admitting: Advanced Practice Midwife

## 2021-11-29 ENCOUNTER — Encounter (HOSPITAL_COMMUNITY): Payer: Self-pay | Admitting: *Deleted

## 2021-11-29 ENCOUNTER — Ambulatory Visit (INDEPENDENT_AMBULATORY_CARE_PROVIDER_SITE_OTHER): Payer: Medicaid Other | Admitting: General Practice

## 2021-11-29 ENCOUNTER — Ambulatory Visit (INDEPENDENT_AMBULATORY_CARE_PROVIDER_SITE_OTHER): Payer: Medicaid Other

## 2021-11-29 ENCOUNTER — Other Ambulatory Visit: Payer: Self-pay

## 2021-11-29 ENCOUNTER — Other Ambulatory Visit (HOSPITAL_COMMUNITY): Payer: Self-pay | Admitting: *Deleted

## 2021-11-29 VITALS — BP 104/65 | HR 76

## 2021-11-29 DIAGNOSIS — Z8759 Personal history of other complications of pregnancy, childbirth and the puerperium: Secondary | ICD-10-CM | POA: Diagnosis not present

## 2021-11-29 DIAGNOSIS — O0990 Supervision of high risk pregnancy, unspecified, unspecified trimester: Secondary | ICD-10-CM

## 2021-11-29 DIAGNOSIS — N898 Other specified noninflammatory disorders of vagina: Secondary | ICD-10-CM

## 2021-11-29 IMAGING — US US FETAL BPP W/ NON-STRESS
1 series · 13 of 13 positions shown · non-contrast
Comparison: none

[Series 1: us fetal bpp w/ non-stress · 13 acquisitions, 13 frames shown]
[im 1/13]
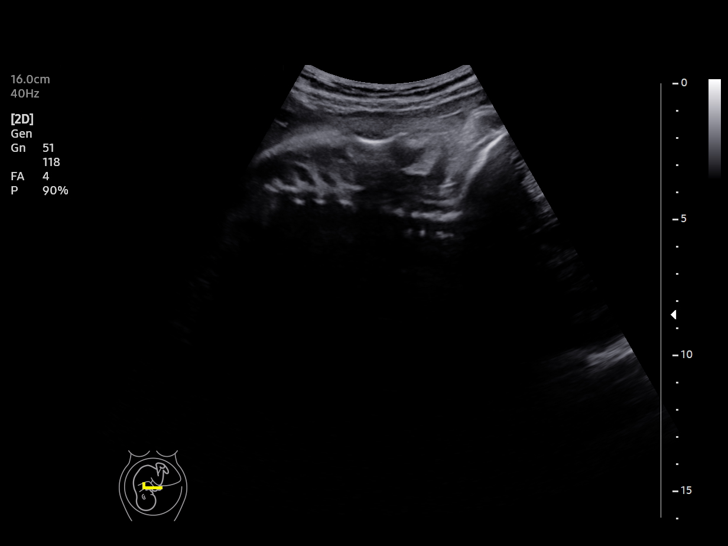
[im 2/13]
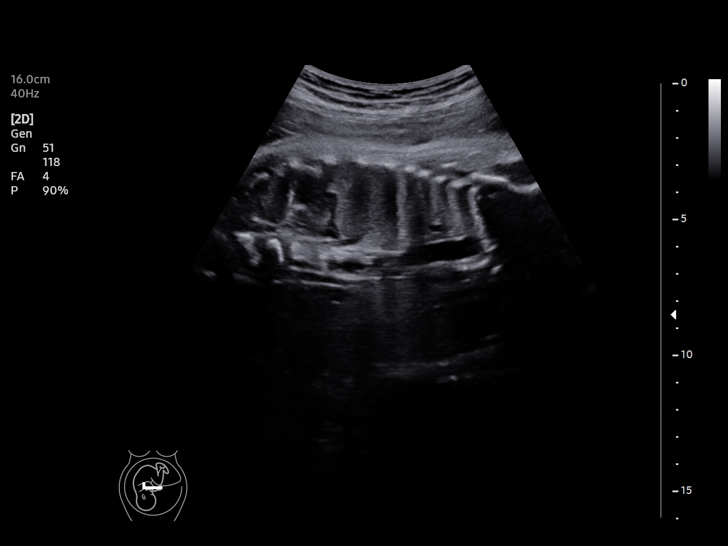
[im 3/13]
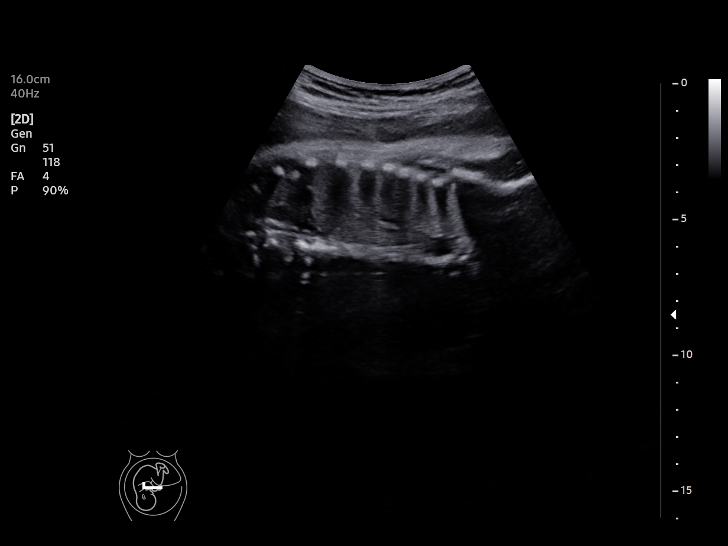
[im 4/13]
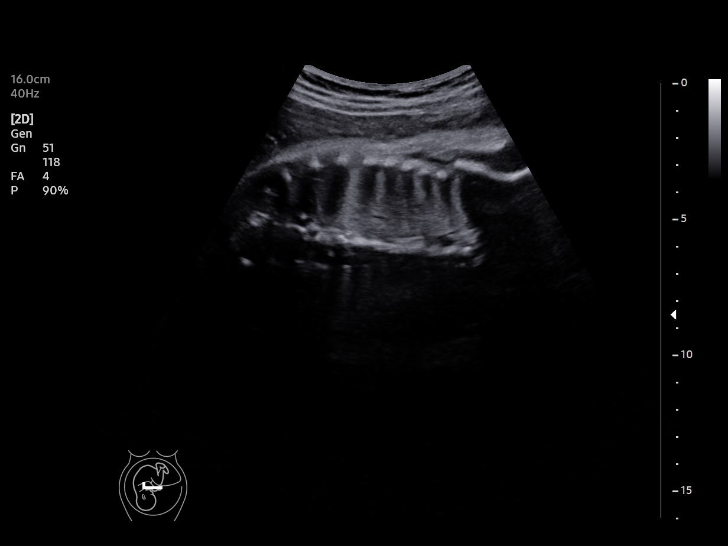
[im 5/13]
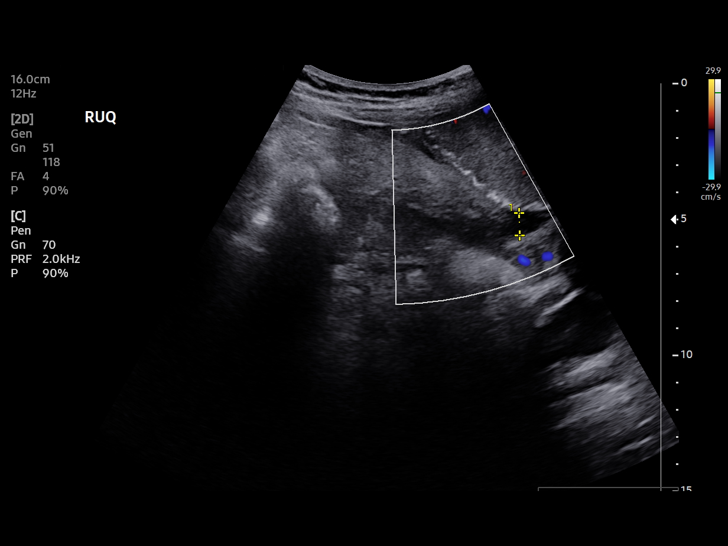
[im 6/13]
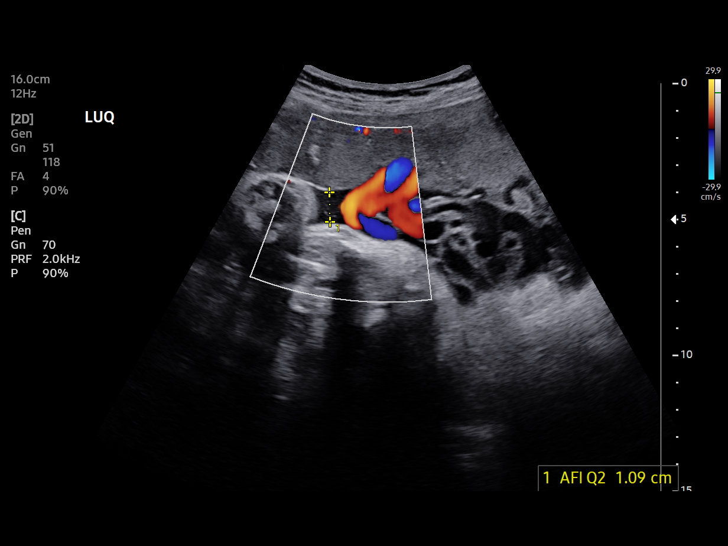
[im 7/13]
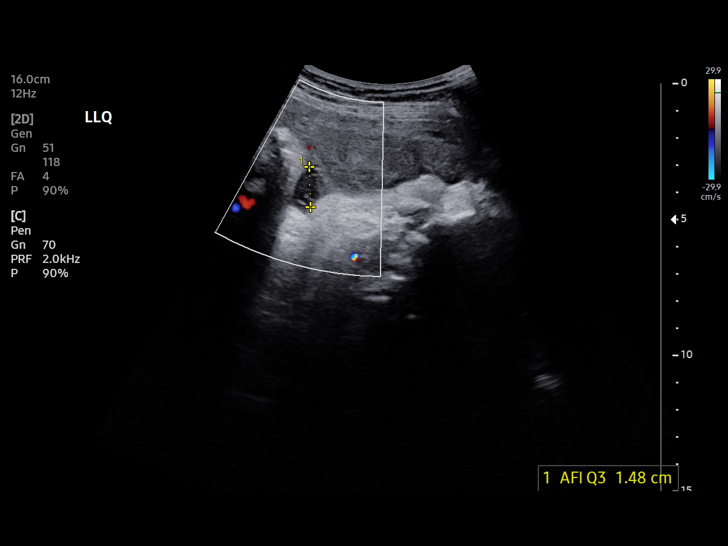
[im 8/13]
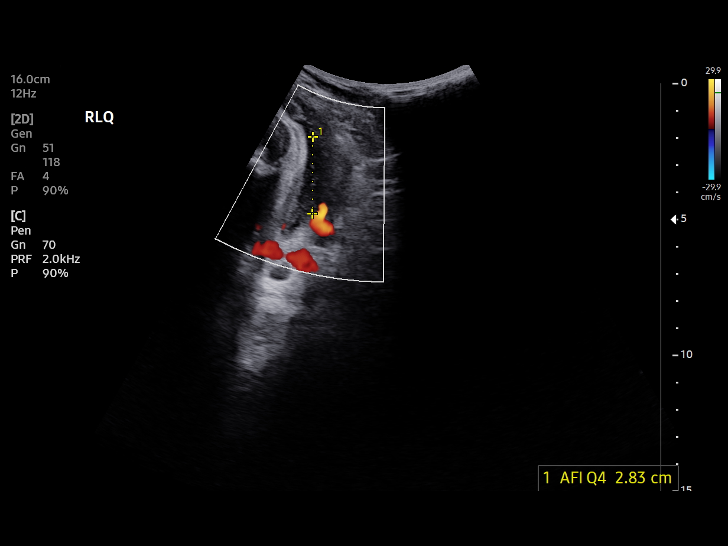
[im 9/13]
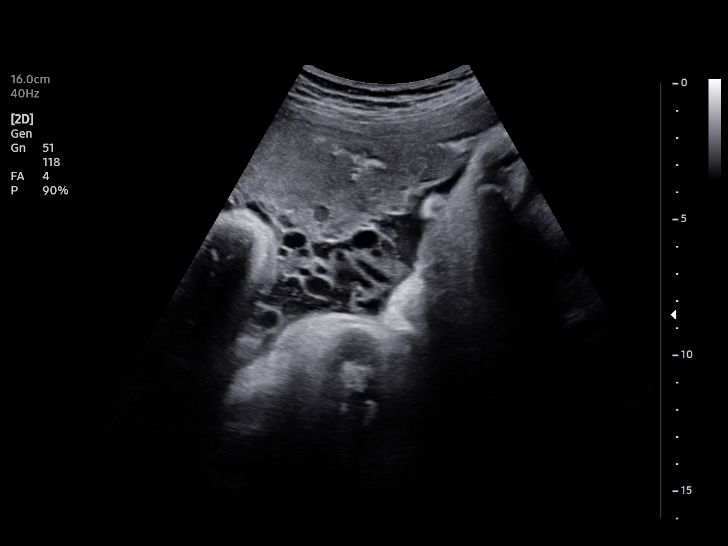
[im 10/13]
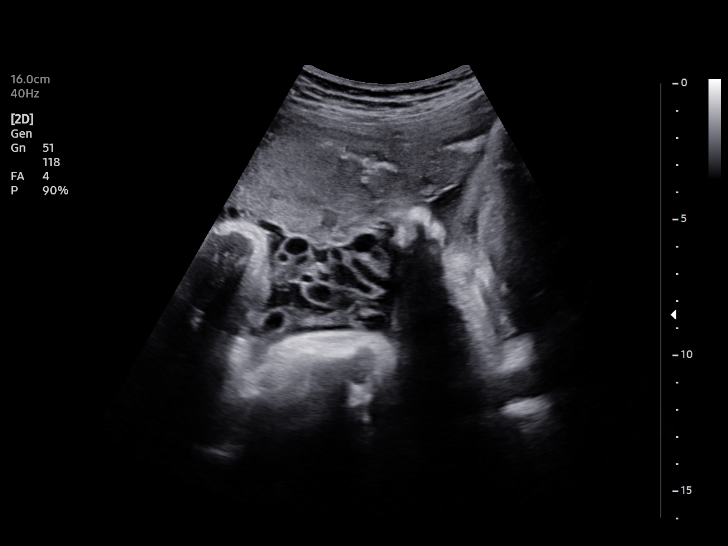
[im 11/13]
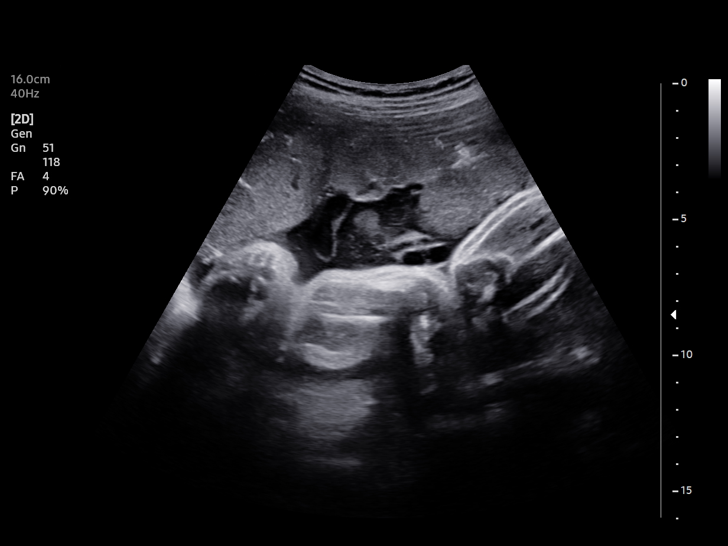
[im 12/13]
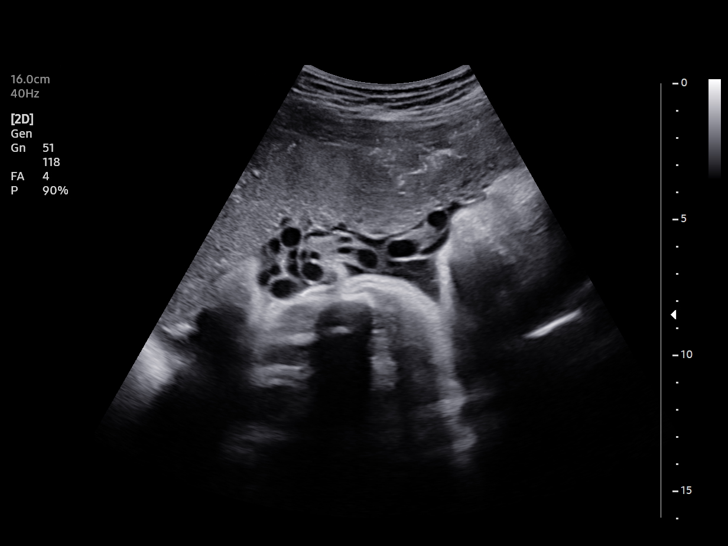
[im 13/13]
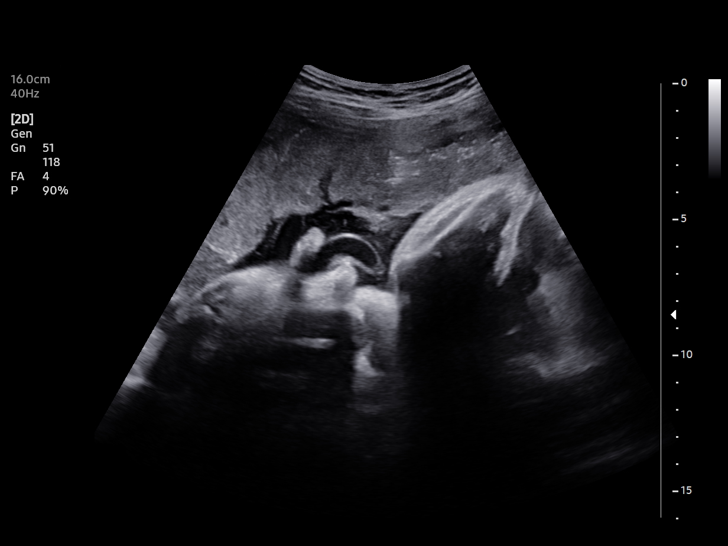

[13 of 13 positions shown; findings below may reference images not displayed]

[REDACTED]care at

Indications

 34 weeks gestation of pregnancy
 Poor obstetric history: Previous neonatal
 death
Fetal Evaluation

 Num Of Fetuses:         1
 Preg. Location:         Intrauterine
 Cardiac Activity:       Observed
 Fetal Lie:              Maternal left side
 Presentation:           Cephalic

 Amniotic Fluid
 AFI FV:      Subjectively low-normal

 AFI Sum(cm)     %Tile       Largest Pocket(cm)
 8.77            11

 RUQ(cm)       RLQ(cm)       LUQ(cm)        LLQ(cm)


 Comment:    BPP [DATE]
Biophysical Evaluation

 Amniotic F.V:   Pocket => 2 cm             F. Tone:        Observed
 F. Movement:    Observed                   N.S.T:          Reactive
 F. Breathing:   Observed                   Score:          [DATE]
OB History

 Gravidity:    3
 Living:       1
Gestational Age

 LMP:           34w 6d        Date:  03/02/21                 EDD:   12/07/21
 Best:          34w 6d     Det. By:  LMP  (03/02/21)          EDD:   12/07/21

## 2021-11-29 NOTE — Progress Notes (Signed)
° °  PRENATAL VISIT NOTE  Subjective:  [name] is a 31 y.o. G3P2001 at [redacted]w[redacted]d being seen today for ongoing prenatal care.  She is currently monitored for the following issues for this high-risk pregnancy and has Supervision of high risk pregnancy, antepartum; History of retained placenta in prior pregnancy, currently pregnant; Prior delivery with meconium aspiration; GBS bacteriuria; Loss of infant; History of postpartum endometritis; Gestational thrombocytopenia ([place]); GDM (gestational diabetes mellitus); Low vitamin D level; and Language barrier on their problem list.  Patient reports  vaginal discharge since last night .  Contractions: Irritability.  .  Movement: Present. Denies leaking of fluid.   The following portions of the patient's history were reviewed and updated as appropriate: allergies, current medications, past family history, past medical history, past social history, past surgical history and problem list.   Objective:   Vitals:   11/29/21 1454  BP: 104/65  Pulse: 76    Fetal Status: Fetal Heart Rate (bpm): NST   Movement: Present  Presentation: Vertex  General:  Alert, oriented and cooperative. Patient is in no acute distress.  Skin: Skin is warm and dry. No rash noted.   Cardiovascular: Normal heart rate noted  Respiratory: Normal respiratory effort, no problems with respiration noted  Abdomen: Soft, gravid, appropriate for gestational age.        Pelvic: Cervical exam deferred        Extremities: Normal range of motion.     Mental Status: Normal mood and affect. Normal behavior. Normal judgment and thought content.   Assessment and Plan:  Pregnancy: G3P2001 at [redacted]w[redacted]d 1. History of neonatal death  - US FETAL BPP W/NONSTRESS; Future   2. Vaginal discharge during pregnancy in third trimester - Asked to assess patient for r/o ROM - Speculum exam negative for pooling of amniotic fluid. Scant amount of thin white discharge noted on exam - Labor precautions  reviewed - Keep IOL scheduled tomorrow morning 2/24   Term labor symptoms and general obstetric precautions including but not limited to vaginal bleeding, contractions, leaking of fluid and fetal movement were reviewed in detail with the patient. Please refer to After Visit Summary for other counseling recommendations.   No follow-ups on file.  Future Appointments  Date Time Provider [place]  11/30/2021  6:30 AM [facility] [place] None    Renee Harder, CNM

## 2021-11-29 NOTE — Progress Notes (Signed)
Pt informed that the ultrasound is considered a limited OB ultrasound and is not intended to be a complete ultrasound exam.  Patient also informed that the ultrasound is not being completed with the intent of assessing for fetal or placental anomalies or any pelvic abnormalities.  Explained that the purpose of today’s ultrasound is to assess for  BPP, presentation, and AFI.  Patient acknowledges the purpose of the exam and the limitations of the study.    ° °Choua Ikner H RN BSN °11/29/21 ° °

## 2021-11-30 ENCOUNTER — Encounter (HOSPITAL_COMMUNITY): Payer: Self-pay | Admitting: Family Medicine

## 2021-11-30 ENCOUNTER — Other Ambulatory Visit: Payer: Self-pay

## 2021-11-30 ENCOUNTER — Inpatient Hospital Stay (HOSPITAL_COMMUNITY): Payer: Medicaid Other | Admitting: Anesthesiology

## 2021-11-30 ENCOUNTER — Inpatient Hospital Stay (HOSPITAL_COMMUNITY)
Admission: AD | Admit: 2021-11-30 | Discharge: 2021-12-02 | DRG: 806 | Disposition: A | Discharge status: Home / Self Care | Payer: Medicaid Other | Attending: Obstetrics and Gynecology | Admitting: Obstetrics and Gynecology

## 2021-11-30 ENCOUNTER — Encounter (HOSPITAL_COMMUNITY): Payer: Self-pay

## 2021-11-30 ENCOUNTER — Inpatient Hospital Stay (HOSPITAL_COMMUNITY): Payer: Medicaid Other

## 2021-11-30 ENCOUNTER — Inpatient Hospital Stay (HOSPITAL_COMMUNITY): Admit: 2021-11-30 | Payer: Medicaid Other | Admitting: Family Medicine

## 2021-11-30 DIAGNOSIS — Z30017 Encounter for initial prescription of implantable subdermal contraceptive: Secondary | ICD-10-CM | POA: Diagnosis not present

## 2021-11-30 DIAGNOSIS — Z20822 Contact with and (suspected) exposure to covid-19: Secondary | ICD-10-CM | POA: Diagnosis present

## 2021-11-30 DIAGNOSIS — O99113 Other diseases of the blood and blood-forming organs and certain disorders involving the immune mechanism complicating pregnancy, third trimester: Secondary | ICD-10-CM

## 2021-11-30 DIAGNOSIS — O0990 Supervision of high risk pregnancy, unspecified, unspecified trimester: Secondary | ICD-10-CM

## 2021-11-30 DIAGNOSIS — O9912 Other diseases of the blood and blood-forming organs and certain disorders involving the immune mechanism complicating childbirth: Secondary | ICD-10-CM | POA: Diagnosis present

## 2021-11-30 DIAGNOSIS — O99820 Streptococcus B carrier state complicating pregnancy: Secondary | ICD-10-CM | POA: Diagnosis not present

## 2021-11-30 DIAGNOSIS — D696 Thrombocytopenia, unspecified: Secondary | ICD-10-CM | POA: Diagnosis present

## 2021-11-30 DIAGNOSIS — O24420 Gestational diabetes mellitus in childbirth, diet controlled: Principal | ICD-10-CM | POA: Diagnosis present

## 2021-11-30 DIAGNOSIS — O24419 Gestational diabetes mellitus in pregnancy, unspecified control: Secondary | ICD-10-CM | POA: Diagnosis present

## 2021-11-30 DIAGNOSIS — Z3A39 39 weeks gestation of pregnancy: Secondary | ICD-10-CM

## 2021-11-30 DIAGNOSIS — D6959 Other secondary thrombocytopenia: Secondary | ICD-10-CM | POA: Diagnosis present

## 2021-11-30 DIAGNOSIS — Z3490 Encounter for supervision of normal pregnancy, unspecified, unspecified trimester: Secondary | ICD-10-CM | POA: Diagnosis present

## 2021-11-30 DIAGNOSIS — Z8742 Personal history of other diseases of the female genital tract: Secondary | ICD-10-CM

## 2021-11-30 DIAGNOSIS — R8271 Bacteriuria: Secondary | ICD-10-CM | POA: Diagnosis present

## 2021-11-30 DIAGNOSIS — O24410 Gestational diabetes mellitus in pregnancy, diet controlled: Secondary | ICD-10-CM | POA: Diagnosis present

## 2021-11-30 DIAGNOSIS — O09299 Supervision of pregnancy with other poor reproductive or obstetric history, unspecified trimester: Secondary | ICD-10-CM

## 2021-11-30 DIAGNOSIS — O99824 Streptococcus B carrier state complicating childbirth: Secondary | ICD-10-CM | POA: Diagnosis present

## 2021-11-30 DIAGNOSIS — Z8709 Personal history of other diseases of the respiratory system: Secondary | ICD-10-CM

## 2021-11-30 DIAGNOSIS — Z634 Disappearance and death of family member: Secondary | ICD-10-CM

## 2021-11-30 LAB — CBC
HCT: 35.7 % — ABNORMAL LOW (ref 36.0–46.0)
HCT: 35.5 % — ABNORMAL LOW (ref 36.0–46.0)
Hemoglobin: 11.7 g/dL — ABNORMAL LOW (ref 12.0–15.0)
Hemoglobin: 11.5 g/dL — ABNORMAL LOW (ref 12.0–15.0)
MCH: 31.3 pg (ref 26.0–34.0)
MCH: 31.5 pg (ref 26.0–34.0)
MCHC: 32.8 g/dL (ref 30.0–36.0)
MCHC: 32.4 g/dL (ref 30.0–36.0)
MCV: 95.5 fL (ref 80.0–100.0)
MCV: 97.3 fL (ref 80.0–100.0)
Platelets: 97 10*3/uL — ABNORMAL LOW (ref 150–400)
Platelets: 91 10*3/uL — ABNORMAL LOW (ref 150–400)
RBC: 3.74 MIL/uL — ABNORMAL LOW (ref 3.87–5.11)
RBC: 3.65 MIL/uL — ABNORMAL LOW (ref 3.87–5.11)
RDW: 13.5 % (ref 11.5–15.5)
RDW: 13.5 % (ref 11.5–15.5)
WBC: 10.6 10*3/uL — ABNORMAL HIGH (ref 4.0–10.5)
WBC: 9.3 10*3/uL (ref 4.0–10.5)
nRBC: 0 % (ref 0.0–0.2)
nRBC: 0 % (ref 0.0–0.2)

## 2021-11-30 LAB — RPR: RPR Ser Ql: NONREACTIVE

## 2021-11-30 LAB — RESP PANEL BY RT-PCR (FLU A&B, COVID) ARPGX2
Influenza A by PCR: NEGATIVE
Influenza B by PCR: NEGATIVE
SARS Coronavirus 2 by RT PCR: NEGATIVE

## 2021-11-30 LAB — TYPE AND SCREEN
ABO/RH(D): B POS
Antibody Screen: NEGATIVE

## 2021-11-30 LAB — GLUCOSE, CAPILLARY
Glucose-Capillary: 121 mg/dL — ABNORMAL HIGH (ref 70–99)
Glucose-Capillary: 112 mg/dL — ABNORMAL HIGH (ref 70–99)

## 2021-11-30 SURGERY — Surgical Case
Anesthesia: Choice

## 2021-11-30 MED ORDER — LACTATED RINGERS IV SOLN: 500.0000 mL | INTRAVENOUS | Status: DC | PRN

## 2021-11-30 MED ORDER — FENTANYL-BUPIVACAINE-NACL 0.5-0.125-0.9 MG/250ML-% EP SOLN
12.0000 mL/h | EPIDURAL | Status: DC | PRN
  Administered 2021-11-30: 12 mL/h via EPIDURAL
  Filled 2021-11-30: qty 250

## 2021-11-30 MED ORDER — LACTATED RINGERS IV SOLN
500.0000 mL | Freq: Once | INTRAVENOUS | Status: AC
  Administered 2021-11-30: 500 mL via INTRAVENOUS

## 2021-11-30 MED ORDER — OXYTOCIN-SODIUM CHLORIDE 30-0.9 UT/500ML-% IV SOLN
1.0000 m[IU]/min | INTRAVENOUS | Status: DC
  Administered 2021-11-30: 2 m[IU]/min via INTRAVENOUS

## 2021-11-30 MED ORDER — FENTANYL CITRATE (PF) 100 MCG/2ML IJ SOLN
50.0000 ug | INTRAMUSCULAR | Status: DC | PRN
  Administered 2021-11-30 (×3): 50 ug via INTRAVENOUS
  Administered 2021-11-30: 100 ug via INTRAVENOUS
  Administered 2021-11-30: 50 ug via INTRAVENOUS
  Filled 2021-11-30 (×5): qty 2

## 2021-11-30 MED ORDER — LACTATED RINGERS IV SOLN: INTRAVENOUS | Status: DC

## 2021-11-30 MED ORDER — OXYTOCIN-SODIUM CHLORIDE 30-0.9 UT/500ML-% IV SOLN
2.5000 [IU]/h | INTRAVENOUS | Status: DC
  Filled 2021-11-30: qty 500

## 2021-11-30 MED ORDER — PENICILLIN G POT IN DEXTROSE 60000 UNIT/ML IV SOLN
3.0000 10*6.[IU] | INTRAVENOUS | Status: DC
  Administered 2021-11-30 (×2): 3 10*6.[IU] via INTRAVENOUS
  Filled 2021-11-30 (×2): qty 50

## 2021-11-30 MED ORDER — TRANEXAMIC ACID-NACL 1000-0.7 MG/100ML-% IV SOLN: 1000.0000 mg | INTRAVENOUS | Status: DC

## 2021-11-30 MED ORDER — LIDOCAINE HCL (PF) 1 % IJ SOLN: 30.0000 mL | INTRAMUSCULAR | Status: DC | PRN

## 2021-11-30 MED ORDER — IBUPROFEN 600 MG PO TABS: 600.0000 mg | ORAL_TABLET | Freq: Once | ORAL | Status: DC

## 2021-11-30 MED ORDER — TERBUTALINE SULFATE 1 MG/ML IJ SOLN: 0.2500 mg | Freq: Once | INTRAMUSCULAR | Status: DC | PRN

## 2021-11-30 MED ORDER — PHENYLEPHRINE 40 MCG/ML (10ML) SYRINGE FOR IV PUSH (FOR BLOOD PRESSURE SUPPORT)
80.0000 ug | PREFILLED_SYRINGE | INTRAVENOUS | Status: DC | PRN
  Administered 2021-11-30 (×2): 80 ug via INTRAVENOUS

## 2021-11-30 MED ORDER — EPHEDRINE 5 MG/ML INJ: 10.0000 mg | INTRAVENOUS | Status: DC | PRN

## 2021-11-30 MED ORDER — SOD CITRATE-CITRIC ACID 500-334 MG/5ML PO SOLN: 30.0000 mL | ORAL | Status: DC | PRN

## 2021-11-30 MED ORDER — OXYCODONE-ACETAMINOPHEN 5-325 MG PO TABS: 2.0000 | ORAL_TABLET | ORAL | Status: DC | PRN

## 2021-11-30 MED ORDER — MISOPROSTOL 200 MCG PO TABS
800.0000 ug | ORAL_TABLET | Freq: Once | ORAL | Status: DC
  Administered 2021-11-30: 800 ug via RECTAL

## 2021-11-30 MED ORDER — ACETAMINOPHEN 325 MG PO TABS: 650.0000 mg | ORAL_TABLET | ORAL | Status: DC | PRN

## 2021-11-30 MED ORDER — LIDOCAINE HCL (PF) 1 % IJ SOLN
INTRAMUSCULAR | Status: DC | PRN
  Administered 2021-11-30 (×2): 5 mL via EPIDURAL

## 2021-11-30 MED ORDER — OXYCODONE-ACETAMINOPHEN 5-325 MG PO TABS: 1.0000 | ORAL_TABLET | ORAL | Status: DC | PRN

## 2021-11-30 MED ORDER — DIPHENHYDRAMINE HCL 50 MG/ML IJ SOLN: 12.5000 mg | INTRAMUSCULAR | Status: DC | PRN

## 2021-11-30 MED ORDER — MISOPROSTOL 50MCG HALF TABLET
50.0000 ug | ORAL_TABLET | ORAL | Status: DC | PRN
  Administered 2021-11-30: 50 ug via BUCCAL
  Filled 2021-11-30: qty 1

## 2021-11-30 MED ORDER — ONDANSETRON HCL 4 MG/2ML IJ SOLN: 4.0000 mg | Freq: Four times a day (QID) | INTRAMUSCULAR | Status: DC | PRN

## 2021-11-30 MED ORDER — FENTANYL-BUPIVACAINE-NACL 0.5-0.125-0.9 MG/250ML-% EP SOLN
EPIDURAL | Status: AC
  Filled 2021-11-30: qty 250

## 2021-11-30 MED ORDER — MISOPROSTOL 200 MCG PO TABS
ORAL_TABLET | ORAL | Status: AC
  Filled 2021-11-30: qty 4

## 2021-11-30 MED ORDER — SODIUM CHLORIDE 0.9 % IV SOLN
5.0000 10*6.[IU] | Freq: Once | INTRAVENOUS | Status: AC
  Administered 2021-11-30: 5 10*6.[IU] via INTRAVENOUS
  Filled 2021-11-30: qty 5

## 2021-11-30 MED ORDER — TRANEXAMIC ACID-NACL 1000-0.7 MG/100ML-% IV SOLN
INTRAVENOUS | Status: AC
  Administered 2021-11-30: 1000 mg
  Filled 2021-11-30: qty 100

## 2021-11-30 MED ORDER — PHENYLEPHRINE 40 MCG/ML (10ML) SYRINGE FOR IV PUSH (FOR BLOOD PRESSURE SUPPORT)
80.0000 ug | PREFILLED_SYRINGE | INTRAVENOUS | Status: DC | PRN
  Filled 2021-11-30: qty 10

## 2021-11-30 MED ORDER — OXYTOCIN BOLUS FROM INFUSION
333.0000 mL | Freq: Once | INTRAVENOUS | Status: AC
  Administered 2021-11-30: 333 mL via INTRAVENOUS

## 2021-11-30 NOTE — Progress Notes (Signed)
[name] is a 31 y.o. G3P2001 at [redacted]w[redacted]d   Subjective: Laboring in right lateral, moaning through contractions.  Requesting epidural  Objective: BP 110/66    Pulse 70    Temp 99.1 F (37.3 C) (Oral)    Resp 18    Ht 5\' 3"  (1.6 m)    Wt 65.8 kg    LMP 03/02/2021    BMI 25.69 kg/m  No intake/output data recorded. No intake/output data recorded.  FHT:  FHR: 135 bpm, variability: moderate,  accelerations:  Present,  decelerations:  Absent UC:   regular, every 2-6 minutes SVE:   Dilation: 5 Effacement (%): 70 Station: -2 Exam by:: Sam W CNM  Labs: Lab Results  Component Value Date   WBC 9.3 11/30/2021   HGB 11.5 (L) 11/30/2021   HCT 35.5 (L) 11/30/2021   MCV 97.3 11/30/2021   PLT 91 (L) 11/30/2021    Assessment / Plan: --IOL A1GDM at [redacted]w[redacted]d  --Cat I tracing --Pitocin infusing --Patient to have epidural placed now --Initiate position changes with peanut ball when comfortable with epidural and s/p nap --Cervix checked per patient request, not a candidate for AROM at this time --Anticipate vaginal delivery  Darlina Rumpf, CNM 11/30/2021, 8:55 PM

## 2021-11-30 NOTE — Lactation Note (Signed)
This note was copied from a baby's chart. Lactation Consultation Note  Patient Name: [name] [id number] Date: 11/30/2021  [facility] contacted L&D to offer [facility] L&D visit since it was not stated. Hyrum awaiting for call back.    Tyianna Menefee A Higuera Ancidey 11/30/2021, 10:09 PM

## 2021-11-30 NOTE — Progress Notes (Signed)
Labor Progress Note  Checked by Charity fundraiser. Starting to feel more uncomfortable. Plan to assess for AROM shortly. FHT Cat I.   CVE: Dilation: 5 Effacement (%): 70 Station: -2 Presentation: Vertex Exam by:: [employer] RN   Allayne Stack, DO

## 2021-11-30 NOTE — Discharge Summary (Signed)
Postpartum Discharge Summary  Date of Service updated 12/02/21      Patient Name: [name] DOB: 1991-01-02 MRN: [id number]  Date of admission: 11/30/2021 Delivery date:11/30/2021  Delivering provider: Darlina Rumpf  Date of discharge: 12/02/2021  Admitting diagnosis: Encounter for elective induction of labor [Z34.90] Intrauterine pregnancy: [redacted]w[redacted]d    Secondary diagnosis:  Principal Problem:   Encounter for elective induction of labor Active Problems:   Supervision of high risk pregnancy, antepartum   History of retained placenta in prior pregnancy, currently pregnant   Prior delivery with meconium aspiration   GBS bacteriuria   Loss of infant   History of postpartum endometritis   Gestational thrombocytopenia (HCC)   GDM (gestational diabetes mellitus)  Additional problems: Abnormal placenta noted at delivery, sent to pathology    Discharge diagnosis: Term Pregnancy Delivered and GDM A1                                              Post partum procedures: N/A Augmentation: Pitocin and Cytotec Complications: None  Hospital course: Induction of Labor With Vaginal Delivery   31y.o. yo G3P2001 at 359w0das admitted to the hospital 11/30/2021 for induction of labor.  Indication for induction: A1 DM.  Patient had an uncomplicated labor course as follows: Membrane Rupture Time/Date: 10:01 PM ,11/30/2021   Delivery Method:Vaginal, Spontaneous  Episiotomy: None  Lacerations:  None  Details of delivery can be found in separate delivery note.  Patient had a routine postpartum course. Patient is discharged home 12/02/21.  Newborn Data: Birth date:11/30/2021  Birth time:10:01 PM  Gender:Female  Living status:Living  Apgars:9 ,9  Weight:2930 g   Magnesium Sulfate received: No BMZ received: No Rhophylac:N/A MMR:N/A T-DaP:Given prenatally Flu: No Transfusion:No  Physical exam  Vitals:   12/01/21 1046 12/01/21 1450 12/01/21 2009 12/02/21 0504  BP: (!) 113/59  93/64 104/64 105/90  Pulse: 69 70 64 71  Resp: _0 Temp: 99.3 F (37.4 C) 98.6 F (37 C) 98.6 F (37 C) 98.5 F (36.9 C)  TempSrc: Oral Oral Oral Oral  SpO2: 100%     Weight:      Height:       General: alert, cooperative, and no distress Lochia: appropriate Uterine Fundus: firm Incision: N/A DVT Evaluation: No evidence of DVT seen on physical exam. Labs: Lab Results  Component Value Date   WBC 8.8 12/02/2021   HGB 10.3 (L) 12/02/2021   HCT 30.8 (L) 12/02/2021   MCV 96.0 12/02/2021   PLT 97 (L) 12/02/2021   CMP Latest Ref Rng & Units 12/01/2021  Glucose 70 - 99 mg/dL 90   Edinburgh Score: Edinburgh Postnatal Depression Scale Screening Tool 12/01/2021  I have been able to laugh and see the funny side of things. 0  I have looked forward with enjoyment to things. 0  I have blamed myself unnecessarily when things went wrong. 0  I have been anxious or worried for no good reason. 2  I have felt scared or panicky for no good reason. 1  Things have been getting on top of me. 0  I have been so unhappy that I have had difficulty sleeping. 0  I have felt sad or miserable. 1  I have been so unhappy that I have been crying. 1  The thought of harming myself has occurred to me.  0  Edinburgh Postnatal Depression Scale Total 5     After visit meds:  Allergies as of 12/02/2021   No Known Allergies      Medication List     STOP taking these medications    Accu-Chek Guide test strip Generic drug: glucose blood   Accu-Chek Softclix Lancets lancets       TAKE these medications    acetaminophen 325 MG tablet Commonly known as: Tylenol Take 2 tablets (650 mg total) by mouth every 4 (four) hours as needed (for pain scale < 4).   ibuprofen 600 MG tablet Commonly known as: ADVIL Take 1 tablet (600 mg total) by mouth every 6 (six) hours.   Prenatal Vitamin 27-0.8 MG Tabs Take 1 tablet by mouth daily.   Vitamin D (Ergocalciferol) 1.25 MG (50000 UNIT) Caps  capsule Commonly known as: DRISDOL Take 1 capsule (50,000 Units total) by mouth every 7 (seven) days for 12 doses.         Discharge home in stable condition Infant Feeding: Breast Infant Disposition:home with mother Discharge instruction: per After Visit Summary and Postpartum booklet. Activity: Advance as tolerated. Pelvic rest for 6 weeks.  Diet: routine diet Future Appointments:No future appointments. Follow up Visit:  [place] [facility] [place] [facility] Follow up.   Specialty: Obstetrics and Gynecology Why: In 4-6 weeks for postpartum visit Contact information: [place] [id number] [phone]               Message sent by Maryelizabeth Kaufmann, CNM on 11/30/2021  Please schedule this patient for a In person postpartum visit in  4-6 weeks  with the following provider:  Strong preference for Dr. Ernestina Patches please . Additional Postpartum F/U:Postpartum Depression checkup and 2 hour GTT  High risk pregnancy complicated by: GDM Delivery mode:  Vaginal, Spontaneous  Anticipated Birth Control:  Nexplanon, placed inpatient   12/02/2021 [name], CNM

## 2021-11-30 NOTE — H&P (Signed)
OBSTETRIC ADMISSION HISTORY AND PHYSICAL  [name] is a 31 y.o. female G3P2001 with IUP at [redacted]w[redacted]d by LMP presenting for IOL due to history of neonatal demise in 2022. She reports +FMs, No LOF, no VB, no blurry vision, headaches or peripheral edema, and RUQ pain.  Has been having contractions at home. She plans on breast feeding. She request a nexplanon inpatient for birth control. She received her prenatal care at [facility]   Dating: By LMP --->  Estimated Date of Delivery: 12/07/21  Sono:    @[redacted]w[redacted]d , CWD, normal anatomy, cephalic presentation, 2693g, 10-25-1974 EFW   Prenatal History/Complications:  --History of neonatal demise in setting of meconium aspiration  --Gestational thrombocytopenia, plts 97 on admit   --Diet controlled GDM (reports well controlled CBGs)  --History of postpartum endometritis with retained placenta, required D&C  Past Medical History: Past Medical History:  Diagnosis Date   Anemia    Gestational diabetes     Past Surgical History: Past Surgical History:  Procedure Laterality Date   DILATION AND EVACUATION N/A 09/02/2020   Procedure: DILATATION AND EVACUATION WITH ULTRASOUND GUIDANCE;  Surgeon: 09/04/2020, DO;  Location: [facility] OR;  Service: Gynecology;  Laterality: N/A;   OPERATIVE ULTRASOUND  09/02/2020   Procedure: OPERATIVE ULTRASOUND;  Surgeon: 09/04/2020, DO;  Location: [facility] OR;  Service: Gynecology;;    Obstetrical History: OB History     Gravida  3   Para  2   Term  2   Preterm  0   AB  0   Living  1      SAB  0   IAB  0   Ectopic  0   Multiple  0   Live Births  2           Social History Social History   Socioeconomic History   Marital status: Married    Spouse name: Not on file   Number of children: Not on file   Years of education: Not on file   Highest education level: Not on file  Occupational History   Not on file  Tobacco Use   Smoking status: Never   Smokeless tobacco: Never  Vaping Use   Vaping Use:  Never used  Substance and Sexual Activity   Alcohol use: No   Drug use: No   Sexual activity: Not Currently  Other Topics Concern   Not on file  Social History Narrative   Not on file   Social Determinants of Health   Financial Resource Strain: Not on file  Food Insecurity: No Food Insecurity   Worried About Running Out of Food in the Last Year: Never true   Ran Out of Food in the Last Year: Never true  Transportation Needs: No Transportation Needs   Lack of Transportation (Medical): No   Lack of Transportation (Non-Medical): No  Physical Activity: Not on file  Stress: Not on file  Social Connections: Not on file    Family History: Family History  Problem Relation Age of Onset   Diabetes Neg Hx    Cancer Neg Hx    Hypertension Neg Hx     Allergies: No Known Allergies  Medications Prior to Admission  Medication Sig Dispense Refill Last Dose   Prenatal Vit-Fe Fumarate-FA (PRENATAL VITAMIN) 27-0.8 MG TABS Take 1 tablet by mouth daily. 30 tablet 11 11/29/2021   Vitamin D, Ergocalciferol, (DRISDOL) 1.25 MG (50000 UNIT) CAPS capsule Take 1 capsule (50,000 Units total) by mouth every 7 (seven) days for 12 doses.  12 capsule 0 11/29/2021   Accu-Chek Softclix Lancets lancets Use as instructed; check blood glucose 4 times daily 100 each 6    glucose blood (ACCU-CHEK GUIDE) test strip Use as instructed; check blood glucose 4 times daily 100 each 6      Review of Systems   All systems reviewed and negative except as stated in HPI  Blood pressure 103/61, pulse 74, temperature 98.2 F (36.8 C), temperature source Oral, resp. rate 18, height 5\' 3"  (1.6 m), last menstrual period 03/02/2021, unknown if currently breastfeeding. General appearance: alert, cooperative, and no distress Lungs: clear to auscultation bilaterally Heart: regular rate and rhythm Abdomen: soft, non-tender; bowel sounds normal Pelvic: NEFG Extremities: Homans sign is negative, no sign of DVT Presentation:  cephalic Fetal monitoringBaseline: 135 bpm, Variability: Good {> 6 bpm), Accelerations: Reactive, and Decelerations: Absent Uterine activity intermittent  Dilation: 3 Effacement (%): 40 Station: -3 Exam by:: Dr. [email or web address]   Prenatal labs: ABO, Rh: --/--/B POS (02/24 02-11-1997) Antibody: NEG (02/24 0747) Rubella: 19.80 (08/25 1630) RPR: Non Reactive (12/15 0908)  HBsAg: Negative (08/25 1630)  HIV: Non Reactive (12/15 0908)  GBS: Positive/-- (12/15 0000)  2 hr Glucola failed  Genetic screening declined  Anatomy 01-21-2003 normal   Prenatal Transfer Tool  Maternal Diabetes: Yes:  Diabetes Type:  Diet controlled Genetic Screening: Normal Maternal Ultrasounds/Referrals: Normal Fetal Ultrasounds or other Referrals:  None Maternal Substance Abuse:  No Significant Maternal Medications:  None Significant Maternal Lab Results: Group B Strep positive  Results for orders placed or performed during the hospital encounter of 11/30/21 (from the past 24 hour(s))  CBC   Collection Time: 11/30/21  7:47 AM  Result Value Ref Range   WBC 10.6 (H) 4.0 - 10.5 K/uL   RBC 3.74 (L) 3.87 - 5.11 MIL/uL   Hemoglobin 11.7 (L) 12.0 - 15.0 g/dL   HCT 12/02/21 (L) 00.3 - 49.1 %   MCV 95.5 80.0 - 100.0 fL   MCH 31.3 26.0 - 34.0 pg   MCHC 32.8 30.0 - 36.0 g/dL   RDW 79.1 50.5 - 69.7 %   Platelets 97 (L) 150 - 400 K/uL   nRBC 0.0 0.0 - 0.2 %  Type and screen [facility]   Collection Time: 11/30/21  7:47 AM  Result Value Ref Range   ABO/RH(D) B POS    Antibody Screen NEG    Sample Expiration      12/03/2021,2359 Performed at [facility], [address] [place], [place]   Resp Panel by RT-PCR (Flu A&B, Covid) Nasopharyngeal Swab   Collection Time: 11/30/21  8:42 AM   Specimen: Nasopharyngeal Swab; Nasopharyngeal(NP) swabs in vial transport medium  Result Value Ref Range   SARS Coronavirus 2 by RT PCR NEGATIVE NEGATIVE   Influenza A by PCR NEGATIVE NEGATIVE   Influenza B by PCR  NEGATIVE NEGATIVE    Patient Active Problem List   Diagnosis Date Noted   Encounter for elective induction of labor 11/30/2021   Language barrier 11/15/2021   Gestational thrombocytopenia (HCC) 09/22/2021   GDM (gestational diabetes mellitus) 09/22/2021   Low vitamin D level 09/22/2021   History of postpartum endometritis 09/20/2021   History of retained placenta in prior pregnancy, currently pregnant 05/31/2021   Prior delivery with meconium aspiration 05/31/2021   GBS bacteriuria 05/31/2021   Loss of infant 05/31/2021   Supervision of high risk pregnancy, antepartum 05/17/2021    Assessment/Plan:  [name] is a 31 y.o. G3P2001 at [redacted]w[redacted]d  here for IOL due to history of neonatal demise.   #Labor: plan for cytotec buccal to start, likely will be able to switch to pit 2x2 on next check.  #Pain: PRN  #FWB: Cat I  #ID: GBS positive > PCN  #MOF: Breastfeeding  #MOC: Nexplanon inpatient  #Circ: Female, NA   #A1GDM: Well controlled at home. EFW 43%. Check CBGs q4.   #Gestational thrombocytopenia: plts 97 on admit. Will monitor postpartum.  Allayne Stack, DO  11/30/2021, 12:21 PM

## 2021-11-30 NOTE — Discharge Instructions (Signed)

## 2021-11-30 NOTE — Anesthesia Preprocedure Evaluation (Signed)
Anesthesia Evaluation  Patient identified by MRN, date of birth, ID band Patient awake    Reviewed: Allergy & Precautions, Patient's Chart, lab work & pertinent test results  Airway Mallampati: II  TM Distance: >3 FB Neck ROM: Full    Dental no notable dental hx. (+) Teeth Intact, Dental Advisory Given   Pulmonary neg pulmonary ROS,    Pulmonary exam normal breath sounds clear to auscultation       Cardiovascular negative cardio ROS Normal cardiovascular exam Rhythm:Regular Rate:Normal     Neuro/Psych negative neurological ROS  negative psych ROS   GI/Hepatic Neg liver ROS, GERD  ,  Endo/Other  diabetes, Well Controlled, Gestational  Renal/GU negative Renal ROS  negative genitourinary   Musculoskeletal negative musculoskeletal ROS (+)   Abdominal   Peds  Hematology  (+) Blood dyscrasia, anemia , Thrombocytopenia- Plt 91k down from 150k on 2/1 Thought to be gestational in origin, no LFT's on chart   Anesthesia Other Findings   Reproductive/Obstetrics (+) Pregnancy                             Anesthesia Physical Anesthesia Plan  ASA: 2  Anesthesia Plan: Epidural   Post-op Pain Management:    Induction:   PONV Risk Score and Plan:   Airway Management Planned: Natural Airway  Additional Equipment:   Intra-op Plan:   Post-operative Plan:   Informed Consent: I have reviewed the patients History and Physical, chart, labs and discussed the procedure including the risks, benefits and alternatives for the proposed anesthesia with the patient or authorized representative who has indicated his/her understanding and acceptance.       Plan Discussed with: Anesthesiologist  Anesthesia Plan Comments: (Repeat Plt count prior to removing epidural catheter)        Anesthesia Quick Evaluation

## 2021-11-30 NOTE — Progress Notes (Signed)
Labor Progress Note [name] is a 31 y.o. G3P2001 at [redacted]w[redacted]d presented for IOL due to history of neonatal demise.   S: Doing well, feeling some cramping   O:  BP (!) 94/56    Pulse 81    Temp 98.1 F (36.7 C) (Oral)    Resp 16    Ht 5\' 3"  (1.6 m)    LMP 03/02/2021    BMI 25.69 kg/m  EFM: 140/mod/15x15/none  CVE: Dilation: 3 Effacement (%): 50 Station: -3 Presentation: Vertex Exam by:: Dr. [email or web address]   A&P: 31 y.o. G3P2001 [redacted]w[redacted]d  #Labor: Some progression since last check. Will transition to pit 2x2.  #Pain: PRN  #FWB: Cat I  #GBS positive PCN   #A1GDM: CBG 120 after eating. Will monitor closely.   [redacted]w[redacted]d, DO 2:19 PM

## 2021-11-30 NOTE — Anesthesia Procedure Notes (Addendum)
Epidural Patient location during procedure: OB Start time: 11/30/2021 9:13 PM End time: 11/30/2021 9:20 PM  Staffing Anesthesiologist: Mal Amabile, MD Performed: anesthesiologist   Preanesthetic Checklist Completed: patient identified, IV checked, site marked, risks and benefits discussed, surgical consent, monitors and equipment checked, pre-op evaluation and timeout performed  Epidural Patient position: sitting Prep: DuraPrep and site prepped and draped Patient monitoring: continuous pulse ox and blood pressure Approach: midline Location: L3-L4 Injection technique: LOR air  Needle:  Needle type: Tuohy  Needle gauge: 17 G Needle length: 9 cm and 9 Needle insertion depth: 4 cm Catheter type: closed end flexible Catheter size: 19 Gauge Catheter at skin depth: 9 cm Test dose: negative and Other  Assessment Events: blood not aspirated, injection not painful, no injection resistance, no paresthesia and negative IV test  Additional Notes Patient identified. Risks and benefits discussed including failed block, incomplete  Pain control, post dural puncture headache, nerve damage, paralysis, blood pressure Changes, nausea, vomiting, reactions to medications-both toxic and allergic and post Partum back pain. All questions were answered. Patient expressed understanding and wished to proceed. Sterile technique was used throughout procedure. Epidural site was Dressed with sterile barrier dressing. No paresthesias, signs of intravascular injection Or signs of intrathecal spread were encountered.  Patient was more comfortable after the epidural was dosed. Please see RN's note for documentation of vital signs and FHR which are stable. Reason for block:procedure for pain

## 2021-12-01 ENCOUNTER — Encounter (HOSPITAL_COMMUNITY): Payer: Self-pay | Admitting: Family Medicine

## 2021-12-01 DIAGNOSIS — O24410 Gestational diabetes mellitus in pregnancy, diet controlled: Secondary | ICD-10-CM

## 2021-12-01 DIAGNOSIS — O99113 Other diseases of the blood and blood-forming organs and certain disorders involving the immune mechanism complicating pregnancy, third trimester: Secondary | ICD-10-CM

## 2021-12-01 LAB — GLUCOSE, CAPILLARY: Glucose-Capillary: 78 mg/dL (ref 70–99)

## 2021-12-01 LAB — GLUCOSE, RANDOM: Glucose, Bld: 90 mg/dL (ref 70–99)

## 2021-12-01 MED ORDER — IBUPROFEN 600 MG PO TABS
600.0000 mg | ORAL_TABLET | Freq: Four times a day (QID) | ORAL | Status: DC
  Administered 2021-12-01 – 2021-12-02 (×7): 600 mg via ORAL
  Filled 2021-12-01 (×7): qty 1

## 2021-12-01 MED ORDER — ACETAMINOPHEN 325 MG PO TABS
650.0000 mg | ORAL_TABLET | ORAL | Status: DC | PRN
  Administered 2021-12-01 (×3): 650 mg via ORAL
  Filled 2021-12-01 (×5): qty 2

## 2021-12-01 MED ORDER — MAGNESIUM HYDROXIDE 400 MG/5ML PO SUSP: 30.0000 mL | ORAL | Status: DC | PRN

## 2021-12-01 MED ORDER — SIMETHICONE 80 MG PO CHEW: 80.0000 mg | CHEWABLE_TABLET | ORAL | Status: DC | PRN

## 2021-12-01 MED ORDER — WITCH HAZEL-GLYCERIN EX PADS: 1.0000 "application " | MEDICATED_PAD | CUTANEOUS | Status: DC | PRN

## 2021-12-01 MED ORDER — ONDANSETRON HCL 4 MG PO TABS: 4.0000 mg | ORAL_TABLET | ORAL | Status: DC | PRN

## 2021-12-01 MED ORDER — ONDANSETRON HCL 4 MG/2ML IJ SOLN: 4.0000 mg | INTRAMUSCULAR | Status: DC | PRN

## 2021-12-01 MED ORDER — COCONUT OIL OIL: 1.0000 "application " | TOPICAL_OIL | Status: DC | PRN

## 2021-12-01 MED ORDER — OXYCODONE HCL 5 MG PO TABS
5.0000 mg | ORAL_TABLET | Freq: Once | ORAL | Status: AC
  Administered 2021-12-01: 5 mg via ORAL
  Filled 2021-12-01: qty 1

## 2021-12-01 MED ORDER — DIBUCAINE (PERIANAL) 1 % EX OINT: 1.0000 "application " | TOPICAL_OINTMENT | CUTANEOUS | Status: DC | PRN

## 2021-12-01 MED ORDER — DIPHENHYDRAMINE HCL 25 MG PO CAPS: 25.0000 mg | ORAL_CAPSULE | Freq: Four times a day (QID) | ORAL | Status: DC | PRN

## 2021-12-01 MED ORDER — BENZOCAINE-MENTHOL 20-0.5 % EX AERO: 1.0000 "application " | INHALATION_SPRAY | CUTANEOUS | Status: DC | PRN

## 2021-12-01 MED ORDER — PRENATAL MULTIVITAMIN CH
1.0000 | ORAL_TABLET | Freq: Every day | ORAL | Status: DC
  Administered 2021-12-01 – 2021-12-02 (×2): 1 via ORAL
  Filled 2021-12-01 (×2): qty 1

## 2021-12-01 NOTE — Lactation Note (Signed)
This note was copied from a baby's chart. Lactation Consultation Note  Patient Name: Girl Rashauna Tep [id number] Date: 12/01/2021 Reason for consult: Initial assessment;Term Age:32 hours  LC in to visit with P3 Mom of term baby.  Mom resting in bed c/o uterine cramping and asking for pain medication.  RN notified. Baby sleeping in crib swaddled.    Mom reports baby is latching well.  Mom choosing to offer baby formula also.  Mom encouraged to latch baby to the breast at each feeding to support her milk supply.  Mom knows to call for assistance with latching prn.  Mom encouraged to place baby on her chest STS when she is feeling up to it, to encourage more feedings and support baby.   Maternal Data Has patient been taught Hand Expression?: Yes Does the patient have breastfeeding experience prior to this delivery?: Yes How long did the patient breastfeed?: 16 months  Feeding Mother's Current Feeding Choice: Breast Milk and Formula Nipple Type: Slow - flow  Interventions Interventions: Breast feeding basics reviewed;Skin to skin;Breast massage;Hand express;LC Services brochure Consult Status Consult Status: Follow-up Date: 12/02/21 Follow-up type: In-patient    Judee Clara 12/01/2021, 8:51 AM

## 2021-12-01 NOTE — Plan of Care (Signed)
°  Problem: Education: Goal: Knowledge of General Education information will improve Description: Including pain rating scale, medication(s)/side effects and non-pharmacologic comfort measures Outcome: Progressing   Problem: Health Behavior/Discharge Planning: Goal: Ability to manage health-related needs will improve Outcome: Progressing   Problem: Clinical Measurements: Goal: Ability to maintain clinical measurements within normal limits will improve Outcome: Progressing Goal: Will remain free from infection Outcome: Progressing Goal: Diagnostic test results will improve Outcome: Progressing Goal: Respiratory complications will improve Outcome: Progressing Goal: Cardiovascular complication will be avoided Outcome: Progressing   Problem: Activity: Goal: Risk for activity intolerance will decrease Outcome: Progressing   Problem: Nutrition: Goal: Adequate nutrition will be maintained Outcome: Progressing   Problem: Coping: Goal: Level of anxiety will decrease Outcome: Progressing   Problem: Elimination: Goal: Will not experience complications related to bowel motility Outcome: Progressing Goal: Will not experience complications related to urinary retention Outcome: Progressing   Problem: Pain Managment: Goal: General experience of comfort will improve Outcome: Progressing   Problem: Safety: Goal: Ability to remain free from injury will improve Outcome: Progressing   Problem: Skin Integrity: Goal: Risk for impaired skin integrity will decrease Outcome: Progressing   Problem: Education: Goal: Knowledge of Childbirth will improve Outcome: Progressing Goal: Ability to make informed decisions regarding treatment and plan of care will improve Outcome: Progressing Goal: Ability to state and carry out methods to decrease the pain will improve Outcome: Progressing Goal: Individualized Educational Video(s) Outcome: Progressing   Problem: Coping: Goal: Ability to  verbalize concerns and feelings about labor and delivery will improve Outcome: Progressing   Problem: Life Cycle: Goal: Ability to make normal progression through stages of labor will improve Outcome: Progressing Goal: Ability to effectively push during vaginal delivery will improve Outcome: Progressing   Problem: Role Relationship: Goal: Will demonstrate positive interactions with the child Outcome: Progressing   Problem: Safety: Goal: Risk of complications during labor and delivery will decrease Outcome: Progressing   Problem: Pain Management: Goal: Relief or control of pain from uterine contractions will improve Outcome: Progressing    Pt admitted with baby to [facility] this shift. Pt has been stable throughout shift. Bleeding well-controlled with no clots. Pain controlled with oral meds. Pt breastfeeding baby, encouraged to call for support as needed. Pt up to BR, able to void and walk. Continue to monitor and support as needed.

## 2021-12-01 NOTE — Progress Notes (Signed)
Post Partum Day 1 Subjective: up ad lib, voiding, tolerating PO, and Having a lot of pain with cramping  Objective: Blood pressure (!) 86/58, pulse 60, temperature 97.9 F (36.6 C), temperature source Oral, resp. rate 16, height 5\' 3"  (1.6 m), weight 65.8 kg, last menstrual period 03/02/2021, SpO2 98 %, unknown if currently breastfeeding.  Physical Exam:  General: alert, cooperative, no distress, and in pain Lochia: appropriate Uterine Fundus: firm Incision: n/a DVT Evaluation: No evidence of DVT seen on physical exam.  Recent Labs    11/30/21 0747 11/30/21 1944  HGB 11.7* 11.5*  HCT 35.7* 35.5*    Assessment/Plan: Plan for discharge tomorrow and Breastfeeding   LOS: 1 day   12/02/21 12/01/2021, 9:04 AM

## 2021-12-01 NOTE — Anesthesia Postprocedure Evaluation (Signed)
Anesthesia Post Note  Patient: [name]  Procedure(s) Performed: AN AD HOC LABOR EPIDURAL     Patient location during evaluation: Mother Baby Anesthesia Type: Epidural Level of consciousness: awake and alert Pain management: pain level controlled Vital Signs Assessment: post-procedure vital signs reviewed and stable Respiratory status: spontaneous breathing, nonlabored ventilation and respiratory function stable Cardiovascular status: stable Postop Assessment: no headache, no backache and epidural receding Anesthetic complications: no   No notable events documented.  Last Vitals:  Vitals:   12/01/21 0130 12/01/21 0530  BP: 99/61 (!) 86/58  Pulse: 68 60  Resp: 16 16  Temp: 36.7 C 36.6 C  SpO2:      Last Pain:  Vitals:   12/01/21 0935  TempSrc:   PainSc: Asleep   Pain Goal: Patients Stated Pain Goal: 3 (12/01/21 8295)                 Rica Records

## 2021-12-01 NOTE — Progress Notes (Signed)
CSW received consult for hx of Anxiety after a loss. CSW met with MOB to offer support and complete assessment.    When CSW arrived, MOB was resting in bed bonding with infant as evidence by engaging in breastfeeding.  FOB and MOB's mom was also present and was observing MOB interacting with infant.  CSW attempted to utilize interpreting services to assist with language barriers.  MOB spoke and said interpreting service is not necessary because she speaks and understands English well. CSW explained CSW's role and MOB promptly informed CSW that it's no need for CSW to meet with MOB.  MOB stated, "I know what I have I have been feeling is normal for what I have been through with the loss of my baby."  MOB declined resources and information about PMADs however she was open to CSW leaving her literature. MOB communicated that she plans to continue to meet with therapist  Roselyn Reef) at [facility].   CSW identifies no further need for intervention and no barriers to discharge at this time.   Laurey Arrow, MSW, LCSW Clinical Social Work [phone]

## 2021-12-02 DIAGNOSIS — Z30017 Encounter for initial prescription of implantable subdermal contraceptive: Secondary | ICD-10-CM

## 2021-12-02 LAB — CBC
HCT: 30.8 % — ABNORMAL LOW (ref 36.0–46.0)
Hemoglobin: 10.3 g/dL — ABNORMAL LOW (ref 12.0–15.0)
MCH: 32.1 pg (ref 26.0–34.0)
MCHC: 33.4 g/dL (ref 30.0–36.0)
MCV: 96 fL (ref 80.0–100.0)
Platelets: 97 10*3/uL — ABNORMAL LOW (ref 150–400)
RBC: 3.21 MIL/uL — ABNORMAL LOW (ref 3.87–5.11)
RDW: 13.4 % (ref 11.5–15.5)
WBC: 8.8 10*3/uL (ref 4.0–10.5)
nRBC: 0 % (ref 0.0–0.2)

## 2021-12-02 MED ORDER — ETONOGESTREL 68 MG ~~LOC~~ IMPL
68.0000 mg | DRUG_IMPLANT | Freq: Once | SUBCUTANEOUS | Status: AC
  Administered 2021-12-02: 68 mg via SUBCUTANEOUS
  Filled 2021-12-02: qty 1

## 2021-12-02 MED ORDER — IBUPROFEN 600 MG PO TABS: 600.0000 mg | ORAL_TABLET | Freq: Four times a day (QID) | ORAL | 0 refills | Status: DC

## 2021-12-02 MED ORDER — ACETAMINOPHEN 325 MG PO TABS: 650.0000 mg | ORAL_TABLET | ORAL | 0 refills | Status: AC | PRN

## 2021-12-02 MED ORDER — LIDOCAINE HCL 1 % IJ SOLN
0.0000 mL | Freq: Once | INTRAMUSCULAR | Status: AC | PRN
  Administered 2021-12-02: 20 mL via INTRADERMAL
  Filled 2021-12-02: qty 20

## 2021-12-02 NOTE — Plan of Care (Signed)
Problem: Education: °Goal: Knowledge of General Education information will improve °Description: Including pain rating scale, medication(s)/side effects and non-pharmacologic comfort measures °Outcome: Completed/Met °  °Problem: Clinical Measurements: °Goal: Ability to maintain clinical measurements within normal limits will improve °Outcome: Completed/Met °Goal: Will remain free from infection °Outcome: Completed/Met °Goal: Diagnostic test results will improve °Outcome: Completed/Met °Goal: Respiratory complications will improve °Outcome: Completed/Met °Goal: Cardiovascular complication will be avoided °Outcome: Completed/Met °  °Problem: Activity: °Goal: Risk for activity intolerance will decrease °Outcome: Completed/Met °  °Problem: Nutrition: °Goal: Adequate nutrition will be maintained °Outcome: Completed/Met °  °Problem: Elimination: °Goal: Will not experience complications related to bowel motility °Outcome: Completed/Met °Goal: Will not experience complications related to urinary retention °Outcome: Completed/Met °  °Problem: Pain Managment: °Goal: General experience of comfort will improve °Outcome: Completed/Met °  °Problem: Safety: °Goal: Ability to remain free from injury will improve °Outcome: Completed/Met °  °Problem: Skin Integrity: °Goal: Risk for impaired skin integrity will decrease °Outcome: Completed/Met °  °

## 2021-12-02 NOTE — Procedures (Signed)
GYNECOLOGY PROCEDURE NOTE  [name] is a 31 y.o. [id number] requesting Nexplanon insertion. No gynecologic concerns.  Nexplanon Insertion Procedure Patient identified, informed consent performed, consent signed. Patient does understand that irregular bleeding is a very common side effect of this medication. She was advised to have backup contraception for one week after placement. Appropriate time out taken. Patient's left arm was prepped and draped in the usual sterile fashion. The insertion area was measured and marked. Patient was prepped with alcohol swab and then injected with 3 ml of 1% lidocaine. The area was then prepped with betadine. Nexplanon removed from packaging and device confirmed present within needle, then inserted full length of needle and withdrawn per handbook instructions. Nexplanon was able to palpated in the patient's arm; patient palpated the insert herself. There was minimal blood loss. Patient insertion site covered with steri strip, guaze, and a pressure bandage to reduce any bruising. The patient tolerated the procedure well and was given post procedure instructions.

## 2021-12-02 NOTE — Lactation Note (Signed)
This note was copied from a baby's chart. Lactation Consultation Note  Patient Name: [name] [id number] Date: 12/02/2021 Reason for consult: Follow-up assessment;Term Age:32 hours  LC in to visit with P3 Mom of term baby on day of discharge.  Baby is at 6.8% weight loss with good output.  Baby has been exclusively breastfeeding after 2 small bottle supplements of 1 ml each.   Baby sleeping swaddled in crib.  Mom wanting LC to assess baby at the breast. Baby immediately woke and started showing feeding cues.  LC  changed a good size transitional stool and void diaper.  Assisted Mom to use cross cradle hold with a U hold of her breast.  Reviewed hand expression, transitional milk noted to be easily expressed.  Mom pleased as she thought she didn't have any milk.  Baby latched with guidance by LC, deeply.  Showed Mom how to tug on chin to flange lower lip.  Mom hasn't been feeling any nipple pain and nipples are without any trauma.  Baby sucking consistently with deep jaw extensions and swallows heard.  Mom very pleased.  Hand pump provided.  Mom states she knows how to use this type of pump.  Engorgement prevention and treatment reviewed.    Encouraged STS with baby and offering the breast with feeding cues, shared that there is a goal of 8-12 feedings per 24 hrs.  Mom aware of OP lactation support and encouraged her to call prn.  LATCH Score Latch: Grasps breast easily, tongue down, lips flanged, rhythmical sucking.  Audible Swallowing: Spontaneous and intermittent  Type of Nipple: Everted at rest and after stimulation  Comfort (Breast/Nipple): Soft / non-tender  Hold (Positioning): Assistance needed to correctly position infant at breast and maintain latch.  LATCH Score: 9   Lactation Tools Discussed/Used Tools: Pump Breast pump type: Manual Reason for Pumping: Mom requested  Interventions Interventions: Breast feeding basics reviewed;Assisted with latch;Skin to  skin;Breast massage;Hand express;Breast compression;Adjust position;Support pillows;Position options;Hand pump;LC Services brochure  Discharge Discharge Education: Engorgement and breast care;Warning signs for feeding baby Pump: Manual  Consult Status Consult Status: Complete Date: 12/02/21 Follow-up type: Call as needed    [name] 12/02/2021, 11:18 AM

## 2021-12-04 LAB — SURGICAL PATHOLOGY

## 2021-12-11 ENCOUNTER — Telehealth (HOSPITAL_COMMUNITY): Payer: Self-pay | Admitting: *Deleted

## 2021-12-11 NOTE — Telephone Encounter (Signed)
Discharge follow-up phone call completed with [address], [place] #[id number]. Patient c/o lower back pain - reported that she had an epidural. Receives relief with Tylenol. RN instructed patient that pain at the epidural site may take a couple of weeks to resolve. Encouraged patient to call OB if pain increases or does not improve - patient verbalized understanding. Patient also asked about bleeding. RN instructed patient to call OB if she is soaking a pad or more in an hour, passing clots the size of an egg or larger, running a fever, or having foul smelling discharge. Patient verbalized understanding. Denied any of these symptoms at this time. Patient voiced no other questions or concerns at this time. EPDS=0. Patient voiced no questions or concerns regarding infant at this time. Patient reports infant sleeps in a baby bed on her back or side. RN reviewed ABCs of safe sleep. Patient verbalized understanding. Deforest Hoyles, RN,  12/11/21, 1939 ?

## 2021-12-15 ENCOUNTER — Other Ambulatory Visit: Payer: Self-pay | Admitting: Obstetrics and Gynecology

## 2022-01-14 ENCOUNTER — Ambulatory Visit (INDEPENDENT_AMBULATORY_CARE_PROVIDER_SITE_OTHER): Payer: Medicaid Other | Admitting: Obstetrics and Gynecology

## 2022-01-14 ENCOUNTER — Encounter: Payer: Self-pay | Admitting: Obstetrics and Gynecology

## 2022-01-14 VITALS — BP 104/76 | HR 86 | Ht 64.0 in | Wt 135.0 lb

## 2022-01-14 DIAGNOSIS — R7989 Other specified abnormal findings of blood chemistry: Secondary | ICD-10-CM | POA: Diagnosis not present

## 2022-01-14 DIAGNOSIS — Z789 Other specified health status: Secondary | ICD-10-CM

## 2022-01-14 DIAGNOSIS — Z975 Presence of (intrauterine) contraceptive device: Secondary | ICD-10-CM

## 2022-01-14 HISTORY — DX: Presence of (intrauterine) contraceptive device: Z97.5

## 2022-01-14 NOTE — Progress Notes (Signed)
? ? ?Post Partum Visit Note ? ?[name] is a 31 y.o. [id number] female who presents for a postpartum visit. She is 6 weeks postpartum following a normal spontaneous vaginal delivery.  I have fully reviewed the prenatal and intrapartum course. The delivery was at 83 gestational weeks.  Anesthesia: epidural. Postpartum course has been unremarkable. Baby is doing well. Baby is feeding by both breast and bottle - Similac Advance. Bleeding staining only. Bowel function is normal. Bladder function is normal. Patient is not sexually active. Contraception method is Nexplanon. Postpartum depression screening: negative. ? ? ?The pregnancy intention screening data noted above was reviewed. Potential methods of contraception were discussed. The patient elected to proceed with No data recorded. ? ? ? ?Health Maintenance Due  ?Topic Date Due  ? URINE MICROALBUMIN  Never done  ? ? ?The following portions of the patient's history were reviewed and updated as appropriate: allergies, current medications, past family history, past medical history, past social history, past surgical history, and problem list. ? ?Review of Systems ?Pertinent items noted in HPI and remainder of comprehensive ROS otherwise negative. ? ?Objective:  ?There were no vitals taken for this visit.  ? ?General:  alert  ? Breasts:  not indicated  ?Lungs: clear to auscultation bilaterally  ?Heart:  regular rate and rhythm, S1, S2 normal, no murmur, click, rub or gallop  ?Abdomen: soft, non-tender; bowel sounds normal; no masses,  no organomegaly   ?Wound NA  ?GU exam:  not indicated  ?     ?Assessment:  ? ? 1. Postpartum care following vaginal delivery ?Stable ?Return to nl ADL's as tolerates ? ?2. Low vitamin D level ?Will check with Gluocla test ? ?3. Language barrier ?Video interrupter used during today's visit ? ? ? ? ?Plan:  ? ?Essential components of care per ACOG recommendations: ? ?1.  Mood and well being: Patient with negative depression screening  today. Reviewed local resources for support.  ?- Patient tobacco use? No.   ?- hx of drug use? No.   ? ?2. Infant care and feeding:  ?-Patient currently breastmilk feeding? Yes ?-Social determinants of health (SDOH) reviewed in EPIC. No concerns ? ?3. Sexuality, contraception and birth spacing ?- Patient does not want a pregnancy in the next year.  Desired family size is Uncertain  ?- Reviewed reproductive life planning. Reviewed contraceptive methods based on pt preferences and effectiveness.  Patient desired Hormonal Implant placed post partum  ?- Discussed birth spacing of 18 months ? ?4. Sleep and fatigue ?-Encouraged family/partner/community support of 4 hrs of uninterrupted sleep to help with mood and fatigue ? ?5. Physical Recovery  ?- Discussed patients delivery and complications. She describes her labor as good. ?- Patient had a Vaginal, no problems at delivery. Patient had a no laceration. Perineal healing reviewed. Patient expressed understanding ?- Patient has urinary incontinence? no ?- Patient is safe to resume physical and sexual activity ? ?6.  Health Maintenance ?- HM due items addressed Yes ?- Last pap smear  ?Diagnosis  ?Date Value Ref Range Status  ?05/31/2021   Final  ? - Negative for intraepithelial lesion or malignancy (NILM)  ? Pap smear not done at today's visit.  ?-Breast Cancer screening indicated? No.  ? ?7. Chronic Disease/Pregnancy Condition follow up: None ?To see PCP for LBP if conservative treatment options do not help. ?Tylenol/Motrin reviewed. Warm compresses and heating pad discussed ? ?Will schedule for Glucola and Vitamin D level ? ? ?Arlina Robes, MD ?Center for [facility], [facility]  ?

## 2022-01-14 NOTE — Patient Instructions (Addendum)
Health Maintenance, Female ?Adopting a healthy lifestyle and getting preventive care are important in promoting health and wellness. Ask your health care provider about: ?The right schedule for you to have regular tests and exams. ?Things you can do on your own to prevent diseases and keep yourself healthy. ?What should I know about diet, weight, and exercise? ?Eat a healthy diet ? ?Eat a diet that includes plenty of vegetables, fruits, low-fat dairy products, and lean protein. ?Do not eat a lot of foods that are high in solid fats, added sugars, or sodium. ?Maintain a healthy weight ?Body mass index (BMI) is used to identify weight problems. It estimates body fat based on height and weight. Your health care provider can help determine your BMI and help you achieve or maintain a healthy weight. ?Get regular exercise ?Get regular exercise. This is one of the most important things you can do for your health. Most adults should: ?Exercise for at least 150 minutes each week. The exercise should increase your heart rate and make you sweat (moderate-intensity exercise). ?Do strengthening exercises at least twice a week. This is in addition to the moderate-intensity exercise. ?Spend less time sitting. Even light physical activity can be beneficial. ?Watch cholesterol and blood lipids ?Have your blood tested for lipids and cholesterol at 32 years of age, then have this test every 5 years. ?Have your cholesterol levels checked more often if: ?Your lipid or cholesterol levels are high. ?You are older than 32 years of age. ?You are at high risk for heart disease. ?What should I know about cancer screening? ?Depending on your health history and family history, you may need to have cancer screening at various ages. This may include screening for: ?Breast cancer. ?Cervical cancer. ?Colorectal cancer. ?Skin cancer. ?Lung cancer. ?What should I know about heart disease, diabetes, and high blood pressure? ?Blood pressure and heart  disease ?High blood pressure causes heart disease and increases the risk of stroke. This is more likely to develop in people who have high blood pressure readings or are overweight. ?Have your blood pressure checked: ?Every 3-5 years if you are 32-32 years of age. ?Every year if you are 32 years old or older. ?Diabetes ?Have regular diabetes screenings. This checks your fasting blood sugar level. Have the screening done: ?Once every three years after age 18 if you are at a normal weight and have a low risk for diabetes. ?More often and at a younger age if you are overweight or have a high risk for diabetes. ?What should I know about preventing infection? ?Hepatitis B ?If you have a higher risk for hepatitis B, you should be screened for this virus. Talk with your health care provider to find out if you are at risk for hepatitis B infection. ?Hepatitis C ?Testing is recommended for: ?Everyone born from [birth year] through 1966. ?Anyone with known risk factors for hepatitis C. ?Sexually transmitted infections (STIs) ?Get screened for STIs, including gonorrhea and chlamydia, if: ?You are sexually active and are younger than 32 years of age. ?You are older than 32 years of age and your health care provider tells you that you are at risk for this type of infection. ?Your sexual activity has changed since you were last screened, and you are at increased risk for chlamydia or gonorrhea. Ask your health care provider if you are at risk. ?Ask your health care provider about whether you are at high risk for HIV. Your health care provider may recommend a prescription medicine to help prevent HIV  infection. If you choose to take medicine to prevent HIV, you should first get tested for HIV. You should then be tested every 3 months for as long as you are taking the medicine. ?Pregnancy ?If you are about to stop having your period (premenopausal) and you may become pregnant, seek counseling before you get pregnant. ?Take 400 to 800  micrograms (mcg) of folic acid every day if you become pregnant. ?Ask for birth control (contraception) if you want to prevent pregnancy. ?Osteoporosis and menopause ?Osteoporosis is a disease in which the bones lose minerals and strength with aging. This can result in bone fractures. If you are 32 years old or older, or if you are at risk for osteoporosis and fractures, ask your health care provider if you should: ?Be screened for bone loss. ?Take a calcium or vitamin D supplement to lower your risk of fractures. ?Be given hormone replacement therapy (HRT) to treat symptoms of menopause. ?Follow these instructions at home: ?Alcohol use ?Do not drink alcohol if: ?Your health care provider tells you not to drink. ?You are pregnant, may be pregnant, or are planning to become pregnant. ?If you drink alcohol: ?Limit how much you have to: ?0-1 drink a day. ?Know how much alcohol is in your drink. In the [place] one drink equals one 12 oz bottle of beer (355 mL), one 5 oz glass of wine (148 mL), or one 1? oz glass of hard liquor (44 mL). ?Lifestyle ?Do not use any products that contain nicotine or tobacco. These products include cigarettes, chewing tobacco, and vaping devices, such as e-cigarettes. If you need help quitting, ask your health care provider. ?Do not use street drugs. ?Do not share needles. ?Ask your health care provider for help if you need support or information about quitting drugs. ?General instructions ?Schedule regular health, dental, and eye exams. ?Stay current with your vaccines. ?Tell your health care provider if: ?You often feel depressed. ?You have ever been abused or do not feel safe at home. ?Summary ?Adopting a healthy lifestyle and getting preventive care are important in promoting health and wellness. ?Follow your health care provider's instructions about healthy diet, exercising, and getting tested or screened for diseases. ?Follow your health care provider's instructions on monitoring your  cholesterol and blood pressure. ?This information is not intended to replace advice given to you by your health care provider. Make sure you discuss any questions you have with your health care provider. ?Document Revised: 02/12/2021 Document Reviewed: 02/12/2021 ?Elsevier Patient Education ? 2022 Elsevier Inc. ???? ????? ????? ??? ???????? ?Acute Back Pain, Adult ????? ????? ?????? ???? ??????? ??? ???? ???? ????? ?????. ??????? ?? ???? ???? ????? ?? ????? ?????? ???????. ????? ???? ??? ???????: ? ???? ??? ??????? ?? ??????? ???? ?? ?????? ?? ?????. ???????? ?? ??????? ???? ???? ?????? ??????. ???? ?? ???? ??? ??????? ?????? ??? ????? ??? ?? ?? ?????. ? ???? (?????) ????? ?????? ?????? ???????. ?????? ????? ?????? ?????? ?? ???? ?????? ????? ??? ???????? ??? ???? (?????) ?????? ??????. ? ????? ??????? ??? ???? ?? ????? ?????? ??????? ?? ??????? ????????. ? ???? ???? ??? ?????. ? ?????? ???????. ??? ????? ?? ??? ???? ????? ?????? ????? ??????? ?????? ??? ??????. ????? ?? ???? ???? ????? ?????? ?? ?????? ???????? ?? ??????. ????? ????????? ??????? ?? ??????: ???????? ??? ????? ??????? ??????? ? ????? ??????? ??? ??????? ???? ??????? ??????? ????? ????? ????? ???? ?? ??? ???? ????. ?? ???? ??????? ?????? ????? ??????? ?????? ???? ?? ???? ???? ?? ????? ??? ????? ?? ????? ????? ???????. ? ?? ???? ???? ??????? ?????? ???? ????? ??? ???? ????? ?? ????? ???  24-48 ???? ?? ????? ?????. ????? ??? ?????? ??????? ???????: ?? ??? ????? ?? ??? ???????. ?? ?? ????? ??? ???? ??????. ?? ???? ????? ???? 20 ?????? ???? ??? ????? 2-3 ???? ??????. ?? ??? ????? ??? ???? ??? ????? ??? ?????? ??????. ???? ??? ??? ????. ???? ??? ?? ?????? ?????? ?????? ?? ??????? ?? ???????? ???? ???? ???? ???? ????? ????? ?? ?????? ???? ??? ???? ?????. ? ??? ??????? ??? ??????? ??????? ??? ??? ?????? ???? ????? ?? ???? ??????? ??????? ??? ????? ??????? ????. ?????? ???? ??????? ???? ????? ?? ????? ??????? ?????? ??? ?????? ??????? ?? ????? ???????. ?? ????  ?? ????? ??? ????? ????? ???????. ?? ???? ???? ??????? ??? ?????? ???? ?????? ?? 20-30 ?????. ?? ??? ????? ??? ???? ??? ????? ??? ????? ?????? ??????. ???? ???? ??? ?????? ??? ??? ????? ???? ?????? ?????? ??

## 2022-01-15 ENCOUNTER — Other Ambulatory Visit: Payer: Self-pay | Admitting: Lactation Services

## 2022-01-15 MED ORDER — NYSTATIN 100000 UNIT/GM EX CREA: TOPICAL_CREAM | CUTANEOUS | 1 refills | Status: DC

## 2022-01-15 MED ORDER — FLUCONAZOLE 100 MG PO TABS: 100.0000 mg | ORAL_TABLET | Freq: Every day | ORAL | 0 refills | Status: DC

## 2022-01-15 NOTE — Progress Notes (Signed)
Diflucan and Nystatin Cream ordered per standing protocol for pain to right nipple and burning, shooting pains in right breast for 5 days.  ?

## 2022-01-16 ENCOUNTER — Other Ambulatory Visit: Payer: Medicaid Other

## 2022-01-16 DIAGNOSIS — R7989 Other specified abnormal findings of blood chemistry: Secondary | ICD-10-CM

## 2022-01-16 DIAGNOSIS — Z8632 Personal history of gestational diabetes: Secondary | ICD-10-CM

## 2022-02-03 LAB — VITAMIN D 1,25 DIHYDROXY
Vitamin D 1, 25 (OH)2 Total: 38 pg/mL
Vitamin D2 1, 25 (OH)2: 22 pg/mL
Vitamin D3 1, 25 (OH)2: 16 pg/mL

## 2022-02-03 LAB — GLUCOSE TOLERANCE, 2 HOURS
Glucose, 2 hour: 122 mg/dL (ref 70–139)
Glucose, GTT - Fasting: 86 mg/dL (ref 70–99)

## 2022-10-17 ENCOUNTER — Other Ambulatory Visit: Payer: Self-pay

## 2022-10-17 ENCOUNTER — Ambulatory Visit (INDEPENDENT_AMBULATORY_CARE_PROVIDER_SITE_OTHER): Payer: Medicaid Other | Admitting: Family Medicine

## 2022-10-17 VITALS — BP 106/65 | HR 71 | Wt 146.4 lb

## 2022-10-17 DIAGNOSIS — Z3202 Encounter for pregnancy test, result negative: Secondary | ICD-10-CM

## 2022-10-17 DIAGNOSIS — R300 Dysuria: Secondary | ICD-10-CM | POA: Diagnosis not present

## 2022-10-17 LAB — POCT URINALYSIS DIP (DEVICE)
Bilirubin Urine: NEGATIVE
Glucose, UA: NEGATIVE mg/dL
Ketones, ur: NEGATIVE mg/dL
Leukocytes,Ua: NEGATIVE
Nitrite: NEGATIVE
Protein, ur: NEGATIVE mg/dL
Specific Gravity, Urine: 1.02 (ref 1.005–1.030)
Urobilinogen, UA: 0.2 mg/dL (ref 0.0–1.0)
pH: 6.5 (ref 5.0–8.0)

## 2022-10-17 LAB — POCT PREGNANCY, URINE: Preg Test, Ur: NEGATIVE

## 2022-10-17 MED ORDER — CEPHALEXIN 500 MG PO CAPS: 500.0000 mg | ORAL_CAPSULE | Freq: Two times a day (BID) | ORAL | 0 refills | Status: AC

## 2022-10-17 MED ORDER — IBUPROFEN 200 MG PO CAPS: 400.0000 mg | ORAL_CAPSULE | Freq: Every day | ORAL | 0 refills | Status: AC

## 2022-10-17 NOTE — Progress Notes (Signed)
Entire encounter performed in presence of video interpreter.   GYNECOLOGY OFFICE VISIT NOTE  History:   [name] is a 32 y.o. [id number] here today for lower abdominal pain and dysuria x1 month. She endorses pain with urination and feels a muscle pulling pain as well as mid to low back pain. She endorses a history of recurrent UTIs and states these are similar symptoms. She is worried that her pain is from her ovaries. She denies any abnormal vaginal discharge or odor, bleeding, fever, chills, and rash. She declines a pelvic exam.    Past Medical History:  Diagnosis Date   Anemia    GDM (gestational diabetes mellitus) 09/22/2021   Dx on 12/15   Gestational diabetes    Gestational thrombocytopenia ([place]) 09/22/2021   [ ]  recheck plts qmonth until 36wks>q7-10d  CBC Latest Ref Rng & Units 09/20/2021 05/31/2021 09/01/2020 WBC 3.4 - 10.8 x10E3/uL 10.8 7.5 5.5 Hemoglobin 11.1 - 15.9 g/dL 12.2 11.8 12.3 Hematocrit 34.0 - 46.6 % 35.9 35.0 38.8 Platelets 150 - 450 x10E3/uL 147(L) 136(L) 169     History of retained placenta in prior pregnancy, currently pregnant 05/31/2021   Loss of infant 05/31/2021   Neonatal demise, 2021 delivery. 2/2 meconium aspiration, was tx to [facility] and was on ecmo. Died at ~1 month  @ initial visit asked for c-section   Prior delivery with meconium aspiration 05/31/2021    Past Surgical History:  Procedure Laterality Date   DILATION AND EVACUATION N/A 09/02/2020   Procedure: DILATATION AND EVACUATION WITH ULTRASOUND GUIDANCE;  Surgeon: Drema Dallas, DO;  Location: [place];  Service: Gynecology;  Laterality: N/A;   OPERATIVE ULTRASOUND  09/02/2020   Procedure: OPERATIVE ULTRASOUND;  Surgeon: Drema Dallas, DO;  Location: [place];  Service: Gynecology;;    The following portions of the patient's history were reviewed and updated as appropriate: allergies, current medications, past family history, past medical history, past social history, past surgical history and problem  list.   Health Maintenance:  Normal pap and negative HRHPV on 05/31/2021.   Review of Systems:  Pertinent items noted in HPI and remainder of comprehensive ROS otherwise negative.  Physical Exam:  BP 106/65   Pulse 71   Wt 146 lb 6.4 oz (66.4 kg)   Breastfeeding No   BMI 25.13 kg/m  General: Appears well, no acute distress. Age appropriate. Cardiac: RRR, normal heart sounds, no murmurs Respiratory: CTAB, normal effort Abdomen: soft, nontender, nondistended Extremities: No edema or cyanosis. MSK: Negative CVA tenderness Skin: Warm and dry, no rashes noted Neuro: alert and oriented, no focal deficits Psych: normal affect PELVIC:  Patient declined.   Labs and Imaging Results for orders placed or performed in visit on 10/17/22 (from the past 168 hour(s))  POCT urinalysis dip (device)   Collection Time: 10/17/22  1:59 PM  Result Value Ref Range   Glucose, UA NEGATIVE NEGATIVE mg/dL   Bilirubin Urine NEGATIVE NEGATIVE   Ketones, ur NEGATIVE NEGATIVE mg/dL   Specific Gravity, Urine 1.020 1.005 - 1.030   Hgb urine dipstick TRACE (A) NEGATIVE   pH 6.5 5.0 - 8.0   Protein, ur NEGATIVE NEGATIVE mg/dL   Urobilinogen, UA 0.2 0.0 - 1.0 mg/dL   Nitrite NEGATIVE NEGATIVE   Leukocytes,Ua NEGATIVE NEGATIVE  Pregnancy, urine POC   Collection Time: 10/17/22  2:04 PM  Result Value Ref Range   Preg Test, Ur NEGATIVE NEGATIVE   No results found.    Assessment and Plan:    1. Dysuria X1 month symptoms  similar to prior UTIs but UA unremarkable. Will send culture and treat symptomatically. Return precautions. Highly recommend follow up if symptoms persist and at that time would highly recommend pelvic exam, wet prep, GC/Ch.  - POCT urinalysis dip (device) - Urine Culture - Pregnancy, urine POC - cephALEXin (KEFLEX) 500 MG capsule; Take 1 capsule (500 mg total) by mouth 2 (two) times daily for 5 days.  Dispense: 10 capsule; Refill: 0 - Ibuprofen 200 MG CAPS; Take 2 capsules (400 mg total) by  mouth daily for 8 days.  Dispense: 16 capsule; Refill: 0  Routine preventative health maintenance measures emphasized. Please refer to After Visit Summary for other counseling recommendations.   Return if symptoms worsen or fail to improve.    Gerlene Fee, DO OB Fellow, [facility] 10/18/2022, 11:39 AM

## 2022-10-19 LAB — URINE CULTURE

## 2024-09-17 ENCOUNTER — Ambulatory Visit (INDEPENDENT_AMBULATORY_CARE_PROVIDER_SITE_OTHER): Admitting: Obstetrics & Gynecology

## 2024-09-17 ENCOUNTER — Other Ambulatory Visit: Payer: Self-pay

## 2024-09-17 ENCOUNTER — Encounter: Payer: Self-pay | Admitting: Obstetrics & Gynecology

## 2024-09-17 VITALS — BP 111/87 | HR 94 | Wt 146.0 lb

## 2024-09-17 DIAGNOSIS — O039 Complete or unspecified spontaneous abortion without complication: Secondary | ICD-10-CM

## 2024-09-17 DIAGNOSIS — Z3A00 Weeks of gestation of pregnancy not specified: Secondary | ICD-10-CM

## 2024-09-17 MED ORDER — PRENATAL 28-0.8 MG PO TABS: 1.0000 | ORAL_TABLET | Freq: Every day | ORAL | 12 refills | Status: AC

## 2024-09-17 MED ORDER — PRENATAL 28-0.8 MG PO TABS: 1.0000 | ORAL_TABLET | Freq: Every day | ORAL | 12 refills | Status: DC

## 2024-09-17 NOTE — Progress Notes (Unsigned)
 Patient ID: [name], female   DOB: 1990/11/24, 33 y.o.   MRN: [id number]  Chief Complaint  Patient presents with   Abdominal Pain  S/p SAB, seen at urgent care and in [place] last month  HPI [name] is a 33 y.o. female.  [id number]  She was early pregnant and began to have abdominal pain followed by vaginal bleeding and passage of tissue. She was seen at Urgent care and then in [place] where an US  showed no IUP. No bleeding or pain now.  HPI  Past Medical History:  Diagnosis Date   Anemia    GDM (gestational diabetes mellitus) 09/22/2021   Dx on 12/15   Gestational diabetes    Gestational thrombocytopenia 09/22/2021   [ ]  recheck plts qmonth until 36wks>q7-10d  CBC Latest Ref Rng & Units 09/20/2021 05/31/2021 09/01/2020 WBC 3.4 - 10.8 x10E3/uL 10.8 7.5 5.5 Hemoglobin 11.1 - 15.9 g/dL 87.7 88.1 87.6 Hematocrit 34.0 - 46.6 % 35.9 35.0 38.8 Platelets 150 - 450 x10E3/uL 147(L) 136(L) 169     History of retained placenta in prior pregnancy, currently pregnant 05/31/2021   Loss of infant 05/31/2021   Neonatal demise, 2021 delivery. 2/2 meconium aspiration, was tx to [facility] and was on ecmo. Died at ~1 month  @ initial visit asked for c-section   Prior delivery with meconium aspiration 05/31/2021    Past Surgical History:  Procedure Laterality Date   DILATION AND EVACUATION N/A 09/02/2020   Procedure: DILATATION AND EVACUATION WITH ULTRASOUND GUIDANCE;  Surgeon: Storm Setter, DO;  Location: [facility] OR;  Service: Gynecology;  Laterality: N/A;   OPERATIVE ULTRASOUND  09/02/2020   Procedure: OPERATIVE ULTRASOUND;  Surgeon: Storm Setter, DO;  Location: [facility] OR;  Service: Gynecology;;    Family History  Problem Relation Age of Onset   Diabetes Neg Hx    Cancer Neg Hx    Hypertension Neg Hx     Social History Social History[1]  Allergies[2]  Current Outpatient Medications  Medication Sig Dispense Refill   acetaminophen  (TYLENOL ) 325 MG tablet Take 2 tablets (650 mg total) by  mouth every 4 (four) hours as needed (for pain scale < 4). 30 tablet 0   ESTARYLLA 0.25-35 MG-MCG tablet Take 1 tablet by mouth daily. (Patient not taking: Reported on 09/17/2024)     Prenatal 28-0.8 MG TABS Take 1 tablet by mouth daily. 30 tablet 12   No current facility-administered medications for this visit.    Review of Systems Review of Systems  Constitutional: Negative.   Genitourinary:  Negative for pelvic pain, vaginal bleeding and vaginal discharge.    Blood pressure 111/87, pulse 94, weight 146 lb (66.2 kg), last menstrual period 07/28/2024.  Physical Exam Physical Exam Vitals and nursing note reviewed.  Constitutional:      Appearance: She is well-developed.  Cardiovascular:     Rate and Rhythm: Normal rate.  Pulmonary:     Effort: Pulmonary effort is normal.  Neurological:     General: No focal deficit present.     Mental Status: She is alert.  Psychiatric:        Mood and Affect: Mood normal.        Behavior: Behavior normal.     Data Reviewed   Assessment Recent complete SAB  Plan Orders Placed This Encounter  Procedures   Beta hCG quant (ref lab)   CBC   RTC for f/u. She wants to conceive     Lynwood Solomons 09/17/2024, 10:54 AM       [1]  Social History Tobacco Use   Smoking status: Never   Smokeless tobacco: Never  Vaping Use   Vaping status: Never Used  Substance Use Topics   Alcohol use: No   Drug use: No  [2] No Known Allergies

## 2024-09-18 LAB — CBC
Hematocrit: 41.9 % (ref 34.0–46.6)
Hemoglobin: 13 g/dL (ref 11.1–15.9)
MCH: 28.8 pg (ref 26.6–33.0)
MCHC: 31 g/dL — ABNORMAL LOW (ref 31.5–35.7)
MCV: 93 fL (ref 79–97)
Platelets: 212 x10E3/uL (ref 150–450)
RBC: 4.51 x10E6/uL (ref 3.77–5.28)
RDW: 11.9 % (ref 11.7–15.4)
WBC: 4.5 x10E3/uL (ref 3.4–10.8)

## 2024-09-18 LAB — BETA HCG QUANT (REF LAB): hCG Quant: <1 m[IU]/mL

## 2024-09-24 ENCOUNTER — Ambulatory Visit: Payer: Self-pay | Admitting: Obstetrics & Gynecology

## 2024-10-11 LAB — PREGNANCY, URINE: Preg Test, Ur: POSITIVE

## 2024-10-18 ENCOUNTER — Encounter: Payer: Self-pay | Admitting: Obstetrics & Gynecology

## 2024-10-18 ENCOUNTER — Other Ambulatory Visit: Payer: Self-pay

## 2024-10-20 ENCOUNTER — Other Ambulatory Visit: Payer: Self-pay

## 2024-10-20 ENCOUNTER — Other Ambulatory Visit: Payer: Self-pay | Admitting: Family Medicine

## 2024-10-20 ENCOUNTER — Telehealth: Payer: Self-pay

## 2024-10-20 ENCOUNTER — Other Ambulatory Visit

## 2024-10-20 ENCOUNTER — Other Ambulatory Visit: Payer: Self-pay | Admitting: *Deleted

## 2024-10-20 DIAGNOSIS — O3680X0 Pregnancy with inconclusive fetal viability, not applicable or unspecified: Secondary | ICD-10-CM

## 2024-10-20 DIAGNOSIS — Z3491 Encounter for supervision of normal pregnancy, unspecified, first trimester: Secondary | ICD-10-CM | POA: Diagnosis not present

## 2024-10-20 DIAGNOSIS — Z3A01 Less than 8 weeks gestation of pregnancy: Secondary | ICD-10-CM | POA: Diagnosis not present

## 2024-10-20 NOTE — Progress Notes (Signed)
Appointment was cancelled  This encounter was created in error - please disregard.

## 2024-10-20 NOTE — Telephone Encounter (Signed)
 Spoke with Pt to advise that we need Proof of pregnancy from [facility] that she had a positive pregnancy test, also shared that she has to be at least 7 to [redacted] wks along for Viability scan. Pt states that she should be at least 8 weeks.

## 2024-10-21 ENCOUNTER — Encounter: Payer: Self-pay | Admitting: *Deleted

## 2024-11-09 ENCOUNTER — Ambulatory Visit: Payer: Self-pay

## 2024-11-09 ENCOUNTER — Other Ambulatory Visit: Payer: Self-pay

## 2024-11-09 ENCOUNTER — Other Ambulatory Visit (HOSPITAL_COMMUNITY)
Admission: RE | Admit: 2024-11-09 | Discharge: 2024-11-09 | Disposition: A | Source: Ambulatory Visit | Attending: Family Medicine | Admitting: Family Medicine

## 2024-11-09 VITALS — BP 109/70 | HR 75 | Wt 145.0 lb

## 2024-11-09 DIAGNOSIS — Z8632 Personal history of gestational diabetes: Secondary | ICD-10-CM | POA: Insufficient documentation

## 2024-11-09 DIAGNOSIS — Z3490 Encounter for supervision of normal pregnancy, unspecified, unspecified trimester: Secondary | ICD-10-CM | POA: Insufficient documentation

## 2024-11-09 MED ORDER — BLOOD PRESSURE KIT DEVI: 1.0000 | Freq: Once | 0 refills | Status: AC

## 2024-11-09 MED ORDER — GOJJI WEIGHT SCALE MISC: 1.0000 | Freq: Once | 0 refills | Status: AC

## 2024-11-09 NOTE — Progress Notes (Signed)
 New OB Intake  I connected with [name]  on 11/09/24 at  3:15 PM EST in person at [facility].  I discussed the limitations, risks, security and privacy concerns of performing an evaluation and management service by telephone and the availability of in person appointments. I also discussed with the patient that there may be a patient responsible charge related to this service. The patient expressed understanding and agreed to proceed.  I explained I am completing New OB Intake today. We discussed EDD of 06/06/25 based on US  at 7.2 weeks. Pt is [id number]. I reviewed her allergies, medications and Medical/Surgical/OB history.    Patient Active Problem List   Diagnosis Date Noted   Encounter for supervision of low-risk pregnancy, antepartum 11/09/2024   History of gestational diabetes mellitus (GDM) 11/09/2024   History of postpartum endometritis 09/20/2021     Concerns addressed today Discussed past due for papsmear, she is aware provider may recommend at [facility] OB visit or postpartum.     Delivery Plans Plans to deliver at [facility]. Discussed the nature of our practice with multiple providers including residents and students as well as female and female providers. Due to the size of the practice, the delivering provider may not be the same as those providing prenatal care.   Patient is not interested in water birth.  MyChart/Babyscripts MyChart access verified. I explained pt will have some visits in office and some virtually. Babyscripts instructions given and order placed. Patient verifies receipt of registration text/e-mail. Account successfully created and app downloaded. If patient is a candidate for Optimized scheduling, add to sticky note.   Blood Pressure Cuff/Weight Scale Blood pressure cuff ordered for patient to pick-up from [employer]. Explained after first prenatal appt pt will check weekly and document in Babyscripts. Patient does not have weight scale; order sent to [facility], patient may track weight weekly in Babyscripts.  Anatomy US  Explained first scheduled US  will be around 19 weeks. Message sent to MFM clerical team for scheduling.  Is patient a CenteringPregnancy candidate?  Not a candidate due to Language barrier    Is patient a Mom+Baby Combined Care candidate?  Not a candidate    Is patient a candidate for Babyscripts Optimization? No, due to language barrier.   First visit review I reviewed new OB appt with patient. Explained pt will be seen by Delores, NP at first visit 11/17/24. Discussed [facility] genetic screening with patient. She desires [facility]. Routine prenatal labs collected today.   Last Pap Diagnosis  Date Value Ref Range Status  05/31/2021   Final   - Negative for intraepithelial lesion or malignancy (NILM)    Waddell LITTIE Burows, RN 11/09/2024  4:37 PM

## 2024-11-09 NOTE — Patient Instructions (Signed)

## 2024-11-10 ENCOUNTER — Ambulatory Visit: Payer: Self-pay | Admitting: Obstetrics and Gynecology

## 2024-11-10 LAB — CBC/D/PLT+RPR+RH+ABO+RUBIGG...
Antibody Screen: NEGATIVE
Basophils Absolute: 0 10*3/uL (ref 0.0–0.2)
Basos: 0 %
EOS (ABSOLUTE): 0 10*3/uL (ref 0.0–0.4)
Eos: 0 %
HCV Ab: NONREACTIVE
HIV Screen 4th Generation wRfx: NONREACTIVE
Hematocrit: 36.7 % (ref 34.0–46.6)
Hemoglobin: 12.3 g/dL (ref 11.1–15.9)
Hepatitis B Surface Ag: NEGATIVE
Immature Grans (Abs): 0 10*3/uL (ref 0.0–0.1)
Immature Granulocytes: 0 %
Lymphocytes Absolute: 1.3 10*3/uL (ref 0.7–3.1)
Lymphs: 18 %
MCH: 30.4 pg (ref 26.6–33.0)
MCHC: 33.5 g/dL (ref 31.5–35.7)
MCV: 91 fL (ref 79–97)
Monocytes Absolute: 0.4 10*3/uL (ref 0.1–0.9)
Monocytes: 5 %
Neutrophils Absolute: 5.8 10*3/uL (ref 1.4–7.0)
Neutrophils: 77 %
Platelets: 155 10*3/uL (ref 150–450)
RBC: 4.05 x10E6/uL (ref 3.77–5.28)
RDW: 12.2 % (ref 11.7–15.4)
RPR Ser Ql: NONREACTIVE
Rh Factor: POSITIVE
Rubella Antibodies, IGG: 20 {index}
WBC: 7.6 10*3/uL (ref 3.4–10.8)

## 2024-11-10 LAB — HCV INTERPRETATION

## 2024-11-11 LAB — URINE CULTURE, OB REFLEX

## 2024-11-11 LAB — GC/CHLAMYDIA PROBE AMP (~~LOC~~) NOT AT ARMC
Chlamydia: NEGATIVE
Comment: NEGATIVE
Comment: NORMAL
Neisseria Gonorrhea: NEGATIVE

## 2024-11-11 LAB — CULTURE, OB URINE

## 2024-11-17 ENCOUNTER — Encounter: Payer: Self-pay | Admitting: Obstetrics and Gynecology

## 2025-01-24 ENCOUNTER — Ambulatory Visit
